# Patient Record
Sex: Female | Born: 1950 | Race: Black or African American | Hispanic: No | Smoking: Former smoker
Health system: Southern US, Community
[De-identification: ages and names within clinical notes are randomized; demographics above are authoritative.]

## PROBLEM LIST (undated history)

## (undated) DIAGNOSIS — K219 Gastro-esophageal reflux disease without esophagitis: Secondary | ICD-10-CM

## (undated) DIAGNOSIS — G4730 Sleep apnea, unspecified: Secondary | ICD-10-CM

## (undated) DIAGNOSIS — R0683 Snoring: Secondary | ICD-10-CM

## (undated) DIAGNOSIS — F419 Anxiety disorder, unspecified: Secondary | ICD-10-CM

## (undated) DIAGNOSIS — I10 Essential (primary) hypertension: Secondary | ICD-10-CM

## (undated) DIAGNOSIS — K469 Unspecified abdominal hernia without obstruction or gangrene: Secondary | ICD-10-CM

## (undated) DIAGNOSIS — F329 Major depressive disorder, single episode, unspecified: Secondary | ICD-10-CM

## (undated) DIAGNOSIS — Z923 Personal history of irradiation: Secondary | ICD-10-CM

## (undated) DIAGNOSIS — F32A Depression, unspecified: Secondary | ICD-10-CM

## (undated) DIAGNOSIS — M7022 Olecranon bursitis, left elbow: Secondary | ICD-10-CM

## (undated) DIAGNOSIS — M1990 Unspecified osteoarthritis, unspecified site: Secondary | ICD-10-CM

## (undated) DIAGNOSIS — E785 Hyperlipidemia, unspecified: Secondary | ICD-10-CM

## (undated) DIAGNOSIS — K760 Fatty (change of) liver, not elsewhere classified: Secondary | ICD-10-CM

## (undated) HISTORY — PX: HEEL SPUR EXCISION: SHX1733

## (undated) HISTORY — PX: SPINAL CORD STIMULATOR REMOVAL: SHX2423

## (undated) HISTORY — PX: OTHER SURGICAL HISTORY: SHX169

## (undated) HISTORY — DX: Anxiety disorder, unspecified: F41.9

## (undated) HISTORY — PX: BACK SURGERY: SHX140

## (undated) HISTORY — PX: ABDOMINAL HYSTERECTOMY: SHX81

## (undated) HISTORY — PX: CHOLECYSTECTOMY: SHX55

## (undated) HISTORY — PX: TONSILLECTOMY: SUR1361

## (undated) HISTORY — PX: EXCISION / CURETTAGE BONE CYST PHALANGES OF FOOT: SUR479

## (undated) HISTORY — DX: Gastro-esophageal reflux disease without esophagitis: K21.9

## (undated) HISTORY — DX: Hyperlipidemia, unspecified: E78.5

## (undated) NOTE — *Deleted (*Deleted)
Patient Care Team: Marva Panda, NP as PCP - General Pershing Proud, RN as Oncology Nurse Navigator Donnelly Angelica, RN as Oncology Nurse Navigator  DIAGNOSIS: No diagnosis found.  SUMMARY OF ONCOLOGIC HISTORY: Oncology History  Malignant neoplasm of upper-outer quadrant of right breast in female, estrogen receptor positive (HCC)  09/17/2019 Initial Diagnosis   Screening mammogram detected a right breast mass, not palpable on exam. Diagnostic mammogram showed 1.0cm mass at the 12:30 position in the right breast, with a mildly abnormal right axillary lymph node, 4.41mm. Biopsy showed IDC in the breast, grade 3, HER-2 negative (1+), ER+ 70%, PR- 0%, Ki67 85%, and the lymph node negative for carcinoma.   09/24/2019 Cancer Staging   Staging form: Breast, AJCC 8th Edition - Clinical stage from 09/24/2019: Stage IB (cT1b, cN0(f), cM0, G3, ER+, PR-, HER2-) - Signed by Serena Croissant, MD on 10/08/2019   10/01/2019 Surgery   Right lumpectomy ([facility]): IDC, grade 3, 2.8cm, clear margins, 7 right axillary lymph nodes negative for carcinoma.   10/23/2019 Oncotype testing   Oncotype DX recurrence score 68: Greater than 39% risk of distant recurrence at 9 years   11/13/2019 -  Chemotherapy   The patient had palonosetron (ALOXI) injection 0.25 mg, 0.25 mg, Intravenous,  Once, 1 of 6 cycles Administration: 0.25 mg (11/13/2019) methotrexate (PF) chemo injection 84 mg, 40 mg/m2 = 84 mg, Intravenous,  Once, 1 of 6 cycles Administration: 84 mg (11/13/2019) cyclophosphamide (CYTOXAN) 1,260 mg in sodium chloride 0.9 % 250 mL chemo infusion, 600 mg/m2 = 1,260 mg, Intravenous,  Once, 1 of 6 cycles Administration: 1,260 mg (11/13/2019) fluorouracil (ADRUCIL) chemo injection 1,250 mg, 600 mg/m2 = 1,250 mg, Intravenous,  Once, 1 of 6 cycles Administration: 1,250 mg (11/13/2019)  for chemotherapy treatment.      CHIEF COMPLIANT: Cycle 1 Day 8 CMF  INTERVAL HISTORY: [name] is a 60 y.o. with  above-mentioned history of right breast cancer who underwent a right lumpectomy and is currently on adjuvant chemotherapy with CMF. She presents to the clinic today for a toxicity check following cycle 1.  ALLERGIES:  is allergic to ceftin [cefuroxime] and zofran [ondansetron].  MEDICATIONS:  Current Outpatient Medications  Medication Sig Dispense Refill  . ALPRAZolam (XANAX) 0.5 MG tablet Take 0.25-0.5 mg by mouth 2 (two) times daily as needed. For anxiety    . amLODipine-benazepril (LOTREL) 10-20 MG per capsule Take 1 capsule by mouth daily.    . cholecalciferol (VITAMIN D) 400 UNITS TABS Take 400 Units by mouth daily.    . eszopiclone (LUNESTA) 2 MG TABS Take 3 mg by mouth at bedtime as needed for sleep. Take immediately before bedtime    . hydrochlorothiazide (HYDRODIURIL) 25 MG tablet Take 25 mg by mouth daily.    Marland Kitchen ibuprofen (ADVIL) 800 MG tablet Take 1 tablet (800 mg total) by mouth every 8 (eight) hours as needed. 30 tablet 0  . ibuprofen (ADVIL) 800 MG tablet Take 1 tablet (800 mg total) by mouth every 8 (eight) hours as needed. 30 tablet 0  . lidocaine-prilocaine (EMLA) cream Apply to affected area once 30 g 3  . omeprazole (PRILOSEC) 20 MG capsule Take 1 capsule (20 mg total) by mouth daily.    Marland Kitchen oxyCODONE (OXY IR/ROXICODONE) 5 MG immediate release tablet Take 1 tablet (5 mg total) by mouth every 6 (six) hours as needed for severe pain. 15 tablet 0  . prochlorperazine (COMPAZINE) 10 MG tablet Take 1 tablet (10 mg total) by mouth every 6 (  six) hours as needed (Nausea or vomiting). 30 tablet 1  . rosuvastatin (CRESTOR) 10 MG tablet     . sertraline (ZOLOFT) 100 MG tablet Take 200 mg by mouth daily.     No current facility-administered medications for this visit.    PHYSICAL EXAMINATION: ECOG PERFORMANCE STATUS: {CHL ONC ECOG [phone]}  There were no vitals filed for this visit. There were no vitals filed for this visit.  LABORATORY DATA:  I have reviewed the data as  listed CMP Latest Ref Rng & Units 11/13/2019 11/08/2019 09/27/2019  Glucose 70 - 99 mg/dL 161(W) 960(A) 540(J)  BUN 8 - 23 mg/dL 13 14 18   Creatinine 0.44 - 1.00 mg/dL 8.11 9.14 7.82  Sodium 135 - 145 mmol/L 137 140 138  Potassium 3.5 - 5.1 mmol/L 3.5 4.0 3.9  Chloride 98 - 111 mmol/L 101 102 106  CO2 22 - 32 mmol/L 27 29 23   Calcium 8.9 - 10.3 mg/dL 9.3 9.5 9.1  Total Protein 6.5 - 8.1 g/dL 7.7 7.9 7.6  Total Bilirubin 0.3 - 1.2 mg/dL 0.4 0.8 9.5(A)  Alkaline Phos 38 - 126 U/L 102 101 96  AST 15 - 41 U/L 27 35 43(H)  ALT 0 - 44 U/L 20 22 32    Lab Results  Component Value Date   WBC 8.3 11/13/2019   HGB 10.6 (L) 11/13/2019   HCT 33.6 (L) 11/13/2019   MCV 82.0 11/13/2019   PLT 260 11/13/2019   NEUTROABS 6.1 11/13/2019    ASSESSMENT & PLAN:  No problem-specific Assessment & Plan notes found for this encounter.    No orders of the defined types were placed in this encounter.  The patient has a good understanding of the overall plan. she agrees with it. she will call with any problems that may develop before the next visit here.  Total time spent: *** mins including face to face time and time spent for planning, charting and coordination of care  Serena Croissant, MD 11/19/2019  I, Kirt Boys Dorshimer, am acting as scribe for Dr. Serena Croissant.  {insert scribe attestation}

---

## 2000-05-18 ENCOUNTER — Encounter: Payer: Self-pay | Admitting: Gastroenterology

## 2000-05-18 ENCOUNTER — Ambulatory Visit (HOSPITAL_COMMUNITY): Admission: RE | Admit: 2000-05-18 | Discharge: 2000-05-18 | Payer: Self-pay | Admitting: Gastroenterology

## 2000-06-08 ENCOUNTER — Ambulatory Visit (HOSPITAL_COMMUNITY): Admission: RE | Admit: 2000-06-08 | Discharge: 2000-06-08 | Payer: Self-pay | Admitting: Gastroenterology

## 2000-06-08 ENCOUNTER — Encounter (INDEPENDENT_AMBULATORY_CARE_PROVIDER_SITE_OTHER): Payer: Self-pay | Admitting: Specialist

## 2000-06-19 ENCOUNTER — Encounter: Payer: Self-pay | Admitting: General Surgery

## 2000-06-20 ENCOUNTER — Encounter (INDEPENDENT_AMBULATORY_CARE_PROVIDER_SITE_OTHER): Payer: Self-pay

## 2000-06-20 ENCOUNTER — Observation Stay (HOSPITAL_COMMUNITY): Admission: RE | Admit: 2000-06-20 | Discharge: 2000-06-21 | Payer: Self-pay | Admitting: General Surgery

## 2002-07-04 ENCOUNTER — Other Ambulatory Visit (HOSPITAL_COMMUNITY): Admission: RE | Admit: 2002-07-04 | Discharge: 2002-07-16 | Payer: Self-pay | Admitting: Psychiatry

## 2003-10-22 ENCOUNTER — Other Ambulatory Visit (HOSPITAL_COMMUNITY): Admission: RE | Admit: 2003-10-22 | Discharge: 2004-01-20 | Payer: Self-pay | Admitting: Psychiatry

## 2003-10-22 ENCOUNTER — Ambulatory Visit: Payer: Self-pay | Admitting: Psychiatry

## 2004-07-05 ENCOUNTER — Emergency Department (HOSPITAL_COMMUNITY): Admission: EM | Admit: 2004-07-05 | Discharge: 2004-07-05 | Payer: Self-pay | Admitting: Emergency Medicine

## 2004-10-11 ENCOUNTER — Encounter: Admission: RE | Admit: 2004-10-11 | Discharge: 2004-10-11 | Payer: Self-pay | Admitting: Internal Medicine

## 2006-01-13 ENCOUNTER — Encounter: Admission: RE | Admit: 2006-01-13 | Discharge: 2006-01-13 | Payer: Self-pay | Admitting: Specialist

## 2006-03-27 ENCOUNTER — Encounter: Admission: RE | Admit: 2006-03-27 | Discharge: 2006-03-27 | Payer: Self-pay | Admitting: Internal Medicine

## 2006-04-26 ENCOUNTER — Encounter (INDEPENDENT_AMBULATORY_CARE_PROVIDER_SITE_OTHER): Payer: Self-pay | Admitting: *Deleted

## 2006-04-26 ENCOUNTER — Ambulatory Visit (HOSPITAL_COMMUNITY): Admission: RE | Admit: 2006-04-26 | Discharge: 2006-04-26 | Payer: Self-pay | Admitting: Gastroenterology

## 2006-06-23 ENCOUNTER — Ambulatory Visit (HOSPITAL_COMMUNITY): Admission: RE | Admit: 2006-06-23 | Discharge: 2006-06-23 | Payer: Self-pay | Admitting: Neurological Surgery

## 2006-07-13 ENCOUNTER — Inpatient Hospital Stay (HOSPITAL_COMMUNITY): Admission: RE | Admit: 2006-07-13 | Discharge: 2006-07-16 | Payer: Self-pay | Admitting: Neurological Surgery

## 2006-08-21 ENCOUNTER — Encounter: Admission: RE | Admit: 2006-08-21 | Discharge: 2006-08-21 | Payer: Self-pay | Admitting: Neurological Surgery

## 2006-08-29 ENCOUNTER — Encounter: Admission: RE | Admit: 2006-08-29 | Discharge: 2006-08-29 | Payer: Self-pay | Admitting: Neurological Surgery

## 2006-10-02 ENCOUNTER — Encounter: Admission: RE | Admit: 2006-10-02 | Discharge: 2006-10-02 | Payer: Self-pay | Admitting: Neurological Surgery

## 2006-10-18 ENCOUNTER — Observation Stay (HOSPITAL_COMMUNITY): Admission: RE | Admit: 2006-10-18 | Discharge: 2006-10-19 | Payer: Self-pay | Admitting: Neurological Surgery

## 2006-11-13 ENCOUNTER — Encounter: Admission: RE | Admit: 2006-11-13 | Discharge: 2006-11-13 | Payer: Self-pay | Admitting: Neurological Surgery

## 2006-11-27 ENCOUNTER — Encounter: Admission: RE | Admit: 2006-11-27 | Discharge: 2006-11-27 | Payer: Self-pay | Admitting: Anesthesiology

## 2007-01-23 ENCOUNTER — Encounter: Admission: RE | Admit: 2007-01-23 | Discharge: 2007-01-23 | Payer: Self-pay | Admitting: Neurological Surgery

## 2007-05-14 ENCOUNTER — Encounter: Admission: RE | Admit: 2007-05-14 | Discharge: 2007-05-14 | Payer: Self-pay | Admitting: Neurological Surgery

## 2007-05-28 ENCOUNTER — Encounter: Admission: RE | Admit: 2007-05-28 | Discharge: 2007-05-28 | Payer: Self-pay | Admitting: Neurological Surgery

## 2007-09-05 ENCOUNTER — Encounter: Admission: RE | Admit: 2007-09-05 | Discharge: 2007-09-05 | Payer: Self-pay | Admitting: Orthopedic Surgery

## 2008-01-26 ENCOUNTER — Emergency Department (HOSPITAL_COMMUNITY): Admission: EM | Admit: 2008-01-26 | Discharge: 2008-01-26 | Payer: Self-pay | Admitting: Emergency Medicine

## 2009-04-30 ENCOUNTER — Encounter: Admission: RE | Admit: 2009-04-30 | Discharge: 2009-04-30 | Payer: Self-pay | Admitting: Orthopedic Surgery

## 2010-03-07 ENCOUNTER — Encounter: Payer: Self-pay | Admitting: Orthopedic Surgery

## 2010-04-29 ENCOUNTER — Other Ambulatory Visit: Payer: Self-pay | Admitting: Gastroenterology

## 2010-04-29 DIAGNOSIS — R7989 Other specified abnormal findings of blood chemistry: Secondary | ICD-10-CM

## 2010-05-04 ENCOUNTER — Ambulatory Visit
Admission: RE | Admit: 2010-05-04 | Discharge: 2010-05-04 | Disposition: A | Discharge status: Home / Self Care | Payer: Medicare Other | Source: Ambulatory Visit | Attending: Gastroenterology | Admitting: Gastroenterology

## 2010-05-04 DIAGNOSIS — R7989 Other specified abnormal findings of blood chemistry: Secondary | ICD-10-CM

## 2010-06-29 NOTE — Op Note (Signed)
NAMEFRANNY, [name] NO.:  [id number]   MEDICAL RECORD NO.:  [id number]          PATIENT TYPE:  INP   LOCATION:  3012                         FACILITY:  [facility]   PHYSICIAN:  Tia Alert, MD     DATE OF BIRTH:  May 23, 1950   DATE OF PROCEDURE:  07/13/2006  DATE OF DISCHARGE:                               OPERATIVE REPORT   PREOPERATIVE DIAGNOSIS:  Grade 1 spondylolisthesis at L5-S1 with  foraminal stenosis, back and leg pain.   POSTOPERATIVE DIAGNOSIS:  Grade 1 spondylolisthesis at L5-S1 with  foraminal stenosis, back and leg pain.   PROCEDURE:  1. Decompressive laminectomy, facetectomy and foraminotomies L5-S1 for      decompression of the L5-S1 nerve roots requiring more work than is      usually required for a simple PLIF procedure, this is a Gill type      decompression.  2. Posterior lumbar interbody fusion L5-S1 utilizing 10 x 22 mm PEEK      interbody cage packed with local autograft and Actifuse and a 10 x      22 mm Tangent interbody bone wedge.  3. Intertransverse arthrodesis L5-S1 utilizing local autograft and      Actifuse.  4. Nonsegmental fixation L5-S1 utilizing the Stryker radius pedicle      screw system.   SURGEON:  Tia Alert, M.D.   ASSISTANT:  Reinaldo Meeker, M.D.   ANESTHESIA:  General endotracheal anesthesia.   COMPLICATIONS:  None apparent.   INDICATIONS FOR PROCEDURE:  Ms. Lutze is a 60 year old female who is  referred with back and right leg pain.  She had a CT myelogram which  showed significant foraminal stenosis L5-S1 with a grade 1  spondylolisthesis with segmental instability with movement on flexion  and extension films. She had tried medical management for quite some  time without significant relief.  I recommended a posterior  lumbar  interbody fusion of L5-S1.  She understood the risks, benefits, and  expected outcome and wished to proceed.   DESCRIPTION OF PROCEDURE:  The patient was taken to the operating  room  and after induction of adequate generalized endotracheal anesthesia, she  was rolled into the prone position on chest rolls and all pressure  points were padded.  Her lumbar region was prepped with DuraPrep and  draped in the usual sterile fashion.  10 mL of local anesthesia was  injected and a dorsal midline incision was made and carried down to the  lumbosacral fascia.  The fascia was opened. The paraspinous musculature  was taken down in subperiosteal fashion to expose L5-S1.  Intraoperative  x-ray confirmed my level.  I then carried the dissection out over the  facets to expose the transverse processes.  I then used the Kerrison  punches and Leksell rongeur to perform complete laminectomies,  hemifacetectomies, and foraminotomies at L5-S1. The underlying yellow  ligament was removed.  There was quite a bit of stenosis over the L5  nerve roots.  This was removed with Kerrison punches.  A wide  foraminotomy was performed. Both L5 and S1 nerve roots  were freed and  decompressed distally into their respective foramina.   Once my decompression was complete, I turned my attention to the  posterior lumbar interbody fusion.  The disc space was incised  bilaterally with a 15 blade scalpel and then the disc space was  distracted utilizing sequential distraction up to a height of 10 mm.  We  then used the rotating cutter and Epstein curets to prepare the  endplates followed by a 10 mm cutting chisel on the patient's right  side.  I then used a 10 mm PEEK interbody cage packed with local  autograft and Actifuse and tapped this into position at L5-S1 on the  right side.  We then prepared the endplates in the midline and on the  left in the same way utilizing rotating cutter, Epstein curets,  pituitary rongeurs, and a 10 mm cutting chisel.  We then packed the  midline with autograft and Actifuse and then placed a 10 x 22 mm Tangent  bone wedge into the interspace at L5-S1 on the left.   We  then localized the pedicle screw entry zones utilizing surface  landmarks and lateral fluoroscopy.  We probed each pedicle with a  pedicle probe, tapped each pedicle with a 525 tap, and then placed 6.75  x 40 mm pedicle screws into the L5 pedicles bilaterally and 6.75 x 30 mm  pedicle screws into the sacrum bilaterally.  We then decorticated the  transverse processes and placed a mixture of autograft and Actifuse out  over these to perform intertransverse arthrodesis.  We then placed  lordotic rods into the multiaxial screw heads of the pedicle screws and  locked these into position with the locking caps and the antitorque  device after achieving compression on our grafts.  We then irrigated  with saline solution containing bacitracin, dried all bleeding points  with bipolar cautery, inspected our nerve roots once again for adequate  decompression, lined the dura with Gelfoam, placed a medium Hemovac  drain through a separate stab incision, then closed the muscle and  fascia with 0 Vicryl, closing the subcutaneous tissue with 2-0 Vicryl,  and the subcuticular tissue with 3-0 Vicryl.  The skin was closed with  Benzoin and Steri-Strips.  The drapes were removed and a sterile  dressing was applied.  The patient was awakened from general anesthesia  and transported to the recovery room in stable condition.  At the end of  the procedure, all sponge, needle and instrument counts were correct.      Tia Alert, MD  Electronically Signed     DSJ/MEDQ  D:  07/13/2006  T:  07/13/2006  Job:  [id number]

## 2010-06-29 NOTE — Op Note (Signed)
[name], [name]                ACCOUNT NO.:  [id number]   MEDICAL RECORD NO.:  [id number]          PATIENT TYPE:  INP   LOCATION:  3172                         FACILITY:  [facility]   PHYSICIAN:  Martha Alert, MD     DATE OF BIRTH:  December 11, 1950   DATE OF PROCEDURE:  10/18/2006  DATE OF DISCHARGE:                               OPERATIVE REPORT   PREOPERATIVE DIAGNOSIS:  Failed lumbar fusion, L5/S1, with loosening of  hardware and extrusion of tangent interbody bone wedge with back and  bilateral leg pain.   POSTOPERATIVE DIAGNOSIS:  Failed lumbar fusion, L5/S1, with loosening of  hardware and extrusion of tangent interbody bone wedge with back and  bilateral leg pain.   PROCEDURE:  1. Re-exploration of lumbar fusion, L5/S1, with redo decompressive      hemifacetectomy, L5/S1, on the left with decompression of the left      L5 and S1 nerve roots.  2. Revision of posterior lumbar antibody fusion, L5/S1 on the left,      with replacement of left L5/S1 tangent interbody bone wedge.  3. Removal of hardware, L5/S1, followed by replacement of hardware,      L5/S1, utilizing the Stryker radius pedicle screw system.  4. Intertransverse arthrodesis, L5/S1 on the left, utilizing      combination of locally-harvested morselized autologous bone graft,      active fuse putty, and BMP-soaked sponges.   SURGEON:  Dr. Marikay Alar.   ASSISTANT:  Dr. Altamease Oiler.   ANESTHESIA:  General endotracheal.   COMPLICATIONS:  None apparent.   INDICATIONS FOR PROCEDURE:  [name] is a 60 year old female who  underwent a posterior lumbar interbody fusion, L5/S1, three months ago.  She was noted on postoperative x-rays to have a mild extrusion of her  tangent interbody bone wedge.  It looks like it had kicked back into the  canal about 3 mm, suggesting a pending pseudoarthrosis and loosening of  the hardware.  We got a CT scan which confirmed this after she presented  with worsening back pain about 2-1/2  months out from her surgery.  I  recommended lumbar re-exploration with exploration of the hardware and  exploration of the tangent interbody bone wedge with hopeful replacement  of that graft.  She understood the risks, benefits, expected outcome and  wished to proceed.   DESCRIPTION OF PROCEDURE:  The patient was taken to operating room.  After induction of adequate general endotracheal anesthesia, she was  rolled to the prone position on the Wilson frame.  All pressure points  were padded.  Her lumbar region was prepped with DuraPrep and then  draped in the usual sterile fashion.  Ten cubic centimeters of local  anesthesia was injected and then her old incision was ellipsed out.  The  incision was carried down to the fascia which was opened.  I identified  the spinous process of L4 and the sacrum and took down the musculature  in a subperiosteal fashion to expose the lamina of L4, the sacrum, and  then took the dissection out over the pedicle screws on  the left side.  I was able to identify the lateral part of the sacrum and the transverse  process of L5.  I then removed the locking caps of the pedicle screws  bilaterally and took out the lordotic rods.  We found both S1 pedicle  screws to be loose.  These were removed.  The L5 pedicle screws were  solid.  I then spent considerable time de-tethering the scar tissue from  the lateral edges.  I started at the inferior part of the decompression  at the sacrum.  I was able to de-tether the dura from the edge of the  sacrum and the S1 pedicle and complete the hemifacetectomy on the left  side with the Kerrison punch.  I was then able to identify the pedicle  of L5 above, identify the under underlying nerve root, and then again  widen the hemifacetectomy there until the L5 nerve roots were all  decompressed.  I was the then able to get into the disk space.  The bone  graft was carried back somewhat posteriorly and medially.  We were able   to use impactors and curettes to push the interbody graft down into the  disk space, where it was felt to be much more solid.  I had tried to  remove the tangent interbody bone wedge but it was quite solid and I was  unable to remove it with a Kocher.  I then re-tapped the S1 pedicles to  make sure that I was bicortical.  We then placed a 7.75 x 35 mm pedicle  screw into the S1 pedicle on the right and a 7.75 x 40 mm pedicle screw  into the S1 pedicle on the left to achieve bicortical purchase.  These  screws felt solid.  We then decorticated the transverse processes on the  left side and placed a mixture of BMP, local autograft, and active fuse  out over these to perform intertransverse arthrodesis.  I then placed  two lordotic rods into the multiaxial screw heads of pedicle screws and  locked these into position with a locking cap and anti torque device  after achieving compression on our grafts.  We then irrigated with  saline solution containing bacitracin.  I lined the exposed dura with  Duragen to help prevent epidural scarring.  I then closed the muscle and  the fascia with 0-Vicryl, closed the subcutaneous and subcuticular  tissue with 2-0 and 3-0 Vicryl, and closed the skin with Benzoin and  Steri-Strips.  The drapes were removed.  A sterile dressing was applied.  The patient was awakened from general anesthesia and transferred to the  recovery room in stable condition.  At the end of procedure, all sponge,  needle, and instrument counts were correct.      Martha Alert, MD  Electronically Signed     DSJ/MEDQ  D:  10/18/2006  T:  10/18/2006  Job:  [id number]

## 2010-06-29 NOTE — Discharge Summary (Signed)
[name], [name]                ACCOUNT NO.:  [id number]   MEDICAL RECORD NO.:  [id number]          PATIENT TYPE:  INP   LOCATION:  3012                         FACILITY:  [facility]   PHYSICIAN:  Tia Alert, MD     DATE OF BIRTH:  [birth year]   DATE OF ADMISSION:  07/13/2006  DATE OF DISCHARGE:  07/16/2006                               DISCHARGE SUMMARY   ADMISSION DIAGNOSIS:  Grade 1 spondylolisthesis at L5-S1 with foraminal  stenosis, back and leg pain.   PROCEDURE:  Posterior lumbar interbody fusion L5-S1.   BRIEF HISTORY OF PRESENT ILLNESS:  [name] is a 60 year old female was  referred with back and right leg pain.  She had a CT myelogram which  showed significant foraminal stenosis at L5-S1 with grade 1  spondylolisthesis with segmental instability.  She had dynamic  flexion/extension films which showed instability.  She tried medical  management for quite some time without significant relief.  I  recommended a decompression and instrumented fusion at L5-S1.  She  understood the risks, benefits, and expected outcome and wished to  proceed.   HOSPITAL COURSE:  The patient was admitted on Jul 13, 2006, and taken to  the operating room where she underwent a posterior lumbar interbody  fusion at L5-S1.  The patient tolerated the procedure well, was taken to  the recovery room and then to the floor in stable condition.  For  details of the operative procedure, please see the dictated operative  note.  The patient's hospital course was routine.  There were no  complications.  She spent the first night at bedrest with a Dilaudid PCA  protocol.  The next morning her Foley catheter was discontinued.  She  was allowed out of bed.  She ambulated without difficulty.  She had no  leg pain at this time.  She had appropriate back soreness.  Her Foley  catheter and PCA, again, were discontinued on postoperative day #1.  She  continued to improve and continued to ambulate in the hall.   She  remained afebrile with stable vital signs.  Her incision remained clean,  dry, intact.  She was discharged home in stable condition on July 16, 2006, with plans to follow up with Dr. Yetta Barre in 2 weeks.  She had all of  her discharge questions answered to her satisfaction and demonstrated  her understanding of all discharge instructions.   FINAL DIAGNOSIS:  Posterior lumbar interbody fusion at L5-S1.      Tia Alert, MD  Electronically Signed     DSJ/MEDQ  D:  09/22/2006  T:  09/22/2006  Job:  [id number]

## 2010-07-02 NOTE — Procedures (Signed)
[facility]  Patient:    [name], [name]                       MRN: [id number] Proc. Date: 06/08/00 Adm. Date:  [id number] Attending:  Charna Elizabeth CC:         Kern Reap, M.D.   Procedure Report  DATE OF BIRTH:  10-05-1950  REFERRING PHYSICIAN:  Kern Reap, M.D.  PROCEDURE PERFORMED:  Esophagogastroduodenoscopy.  ENDOSCOPIST:  Anselmo Rod, M.D.  INSTRUMENT USED:  Olympus video panendoscope.  INDICATIONS FOR PROCEDURE:  The patient is a 60 year old African-American female with a history of guaiac positive stools.  The patient claimed she has noticed black stools in the past, rule out peptic ulcer disease, esophagitis, gastritis, etc.  PREPROCEDURE PREPARATION:  Informed consent was procured from the patient. The patient was fasted for eight hours prior to the procedure.  PREPROCEDURE PHYSICAL:  The patient had stable vital signs.  Neck supple. Chest clear to auscultation.  S1, S2 regular.  Abdomen soft with normal abdominal bowel sounds.  DESCRIPTION OF PROCEDURE:  The patient was placed in left lateral decubitus position and sedated with 50 mg of Demerol and 5 mg of Versed intravenously. Once the patient was adequately sedated and maintained on low-flow oxygen and continuous cardiac monitoring, the Olympus video panendoscope was advanced through the mouthpiece, over the tongue, into the esophagus under direct vision.  The entire esophagus appeared normal without evidence of ring, stricture, masses, lesions, esophagitis or Barretts mucosa.  The scope was then advanced to the stomach.  There was antral gastritis noted, no frank ulcers, erosions, masses or polyps were seen.  There was no evidence of a hiatal hernia.  The proximal small bowel including the duodenal bulb appeared normal.  IMPRESSION:  Mild antral gastritis, otherwise normal esophagogastroduodenoscopy.  No source of bleeding identified.  RECOMMENDATION: 1.  Proceed with colonoscopy to rule out right-sided colonic lesions. 2. Avoid all nonsteroidals for now. 3. Further recommendation made after the colonoscopy. DD:  06/08/00 TD:  06/08/00 Job: [id number]

## 2010-07-02 NOTE — Op Note (Signed)
[place]  Patient:    [name], [name]                       MRN: [id number] Proc. Date: 06/20/00 Adm. Date:  [id number] Attending:  Arlis Porta CC:         Anselmo Rod, M.D.  Barbette Hair. Vaughan Basta., M.D.  Kern Reap, M.D.   Operative Report  PREOPERATIVE DIAGNOSIS:  Chronic calculus cholecystitis.  POSTOPERATIVE DIAGNOSIS:  Subacute calculus cholecystitis.  OPERATION:  Laparoscopic cholecystectomy.  SURGEON:  Adolph Pollack, M.D.  ASSISTANT:  Zigmund Daniel, M.D.  ANESTHESIA:  General  INDICATION FOR PROCEDURE:  Mrs. Chesney is a 60 year old female who had been having fairly classic biliary colic. She had an ultrasound which demonstrated multiple gallstones, 1 to 1.5 cm in size with a normal common bile duct.  The liver function tests are within normal limits.  White blood cell count was normal.  She now presents for elective cholecystectomy.  TECHNIQUE:  She was placed supine on the operating table and general anesthetic was administered.  The abdomen was sterilely prepped and draped.  A previous small subumbilical incision was reincised after infiltration of 0.5% plain Marcaine local anesthesia.  The fascia was identified and incision made in the fascia and the peritoneal cavity was entered sharply and under direct vision.  A pursestring suture of 0 Vicryl was placed around the fascial edges. A Hasson trocar was introduced into the peritoneal cavity.  Pneumoperitoneum created by insufflation of CO2 gas.  Next, the laparoscope was introduced and liver surface appeared normal.  The patient was then positioned in a reverse Trendelenburg and partial left lateral decubitus.  Local anesthetic was infiltrated in the epigastric region and an 11 mm trocar was placed through a similar size incision under direct vision.  Two 5 mm trocars were then placed in the right mid abdomen. The fundus of the gallbladder was  grasped and the adhesions between the omentum and gallbladder were taken down with the cautery.  The gallbladder was noted to be somewhat inflamed with some edema. The infundibulum was grasped and retracted toward the right.  Using careful blunt dissection, I was able to isolate the cystic duct. It was clipped three times proximally and once distally and then divided.  The cystic artery was then clipped and divided.  The gallbladder was then dissected free from the liver bed and in the right portion of the gallbladder fossa I noticed a tubular structure going directly into the gallbladder.  I felt this potentially could be an accessory duct of Luschka and put two clips on it. Once the gallbladder was removed from the liver bed, there was a small leak from a puncture wound in the gallbladder. It was placed in the endopouch bag. The liver bed was then irrigated, inspected and bleeding points controlled with the cautery.  The endopouch bag with the gallbladder in it was then removed through the subumbilical port and fascia defect was closed by tightening up and tying down the pursestring suture under direct laparoscope vision.  The perihepatic area was then irrigated once again and no bleeding or bile leakage was noted.  The rest of the trocars were removed and pneumoperitoneum was released.  The skin incisions were closed with 4-0 Monocryl subcuticular stitches followed by Steri-Strips and sterile dressings.  She tolerated the procedure well without any apparent complications and was taken to the recovery room in satisfactory condition. DD:  06/20/00  TD:  06/20/00 Job: [id number]

## 2010-07-02 NOTE — Op Note (Signed)
[name], [name]                ACCOUNT NO.:  [id number]   MEDICAL RECORD NO.:  [id number]          PATIENT TYPE:  AMB   LOCATION:  ENDO                         FACILITY:  [facility]   PHYSICIAN:  Anselmo Rod, M.D.  DATE OF BIRTH:  1950/06/14   DATE OF PROCEDURE:  04/26/2006  DATE OF DISCHARGE:  04/26/2006                               OPERATIVE REPORT   PROCEDURE PERFORMED:  Esophagogastroduodenoscopy.   ENDOSCOPIST:  Anselmo Rod, M.D.   INSTRUMENT USED:  Olympus Pentax video panendoscope.   INDICATION FOR PROCEDURE:  A 60 year old African-American female with a  history of blood in stools and change in bowel habits undergoing an EGD  to rule out peptic ulcer disease, esophagitis, gastritis, etc.   PREPROCEDURE PREPARATION:  Informed consent was procured from the  patient.  The patient fasted for 4 hours prior to the procedure.  The  risks and benefits of the procedure were discussed with the patient in  great detail.   PREPROCEDURE PHYSICAL:  The patient had stable vital signs.  NECK:  Supple.  CHEST:  Clear to auscultation.  S1, S2 regular.  ABDOMEN:  Soft with normal bowel sounds.   DESCRIPTION OF THE PROCEDURE:  The patient was placed in the left  lateral decubitus position, sedated with 75 mcg of Fentanyl and 7.5 mg  of Versed given intravenously in slow incremental doses.  Once the  patient was adequately sedated and maintained on low flow oxygen and  continuous cardiac monitoring, the Pentax video panendoscope was  advanced through the mouthpiece, over the tongue, into the esophagus  under direct vision.  The entire esophagus was widely patent with no  evidence of ring, stricture, mass, esophagitis or Barrett's mucosa.  The  scope was then advanced into the stomach.  The entire gastric mucosa in  the proximal small bowel appeared normal.  There was no acute  obstruction.  No ulcers, erosions, masses or polyps were seen.  No blood  was noted in the upper GI  tract.  The patient tolerated the procedure  well without complications.   IMPRESSION:  Normal esophagogastroduodenoscopy.   RECOMMENDATIONS:  Proceed with a colonoscopy at this time.  Further  recommendations will be made thereafter.     Anselmo Rod, M.D.  Electronically Signed    JNM/MEDQ  D:  04/27/2006  T:  04/29/2006  Job:  [id number]   cc:   Olene Craven, M.D.

## 2010-07-02 NOTE — Procedures (Signed)
[place]  Patient:    Martha, Castro                       MRN: [id number] Proc. Date: 06/08/00 Adm. Date:  [id number] Attending:  Charna Elizabeth CC:         Kern Reap, M.D.   Procedure Report  DATE OF BIRTH:  [birth year]  REFERRING PHYSICIAN:  Kern Reap, M.D.  PROCEDURE PERFORMED:  Colonoscopy.  ENDOSCOPIST:  Anselmo Rod, M.D.  INSTRUMENT USED:  Olympus video colonoscope.  INDICATIONS FOR PROCEDURE:  The patient is a 60 year old African-American female with a history of black stool and essentially unrevealing EGD, rule out colonic polyps, masses, hemorrhoids, etc.  Patient has guaiac positive stool on physical exam in the recent past.  PREPROCEDURE PREPARATION:  Informed consent was procured from the patient. The patient was fasted for eight hours prior to the procedure and prepped with a bottle of magnesium citrate and a gallon of NuLytely the night prior to the procedure.  PREPROCEDURE PHYSICAL:  The patient had stable vital signs.  Neck supple. Chest clear to auscultation.  S1, S2 regular.  Abdomen soft with normal abdominal bowel sounds.  DESCRIPTION OF PROCEDURE:  The patient was placed in the left lateral decubitus position and sedated with an additional 2 mg of Versed intravenously.  Once the patient was adequately sedated and maintained on low-flow oxygen and continuous cardiac monitoring, the Olympus video colonoscope was advanced from the rectum to the cecum with slight difficulty secondary to a large amount of residual stool in the colon, multiple small sessile polyps were noted from 10 to 20 cm.  These were biopsied with hot biopsy forceps.  A few left-sided diverticula were seen.  The rest of the colon up to the cecum appeared healthy.  Very small lesions may have been missed secondary to an inadequate prep.  IMPRESSION: 1. Left-sided diverticulosis. 2. Multiple small sessile polyps in the rectosigmoid  area, biopsied with    a hot biopsy forceps.  RECOMMENDATIONS: 1. Await pathology results. 2. Increase fluid and fiber in the diet. 3. Avoid all nonsteroidals for now. 4. Repeat guaiac testing on an outpatient basis for further recommendations.DD: 06/08/00 TD:  06/08/00 Job: [id number]

## 2010-07-02 NOTE — Discharge Summary (Signed)
[name], [name]                ACCOUNT NO.:  [id number]   MEDICAL RECORD NO.:  [id number]          PATIENT TYPE:  INP   LOCATION:  3012                         FACILITY:  [facility]   PHYSICIAN:  Tia Alert, MD     DATE OF BIRTH:  [birth year]   DATE OF ADMISSION:  07/13/2006  DATE OF DISCHARGE:  07/16/2006                               DISCHARGE SUMMARY   ADMITTING DIAGNOSIS:  Spondylolisthesis, L5-S1.   PROCEDURE:  Posterior lumbar interbody fusion, L5-S1.   BRIEF HISTORY OF PRESENT ILLNESS:  [name] is a 60 year old female who  was referred with severe back and leg pain.  She had an MRI which showed  a spondylolisthesis at L5-S1.  She had tried medical management for  quite some time without significant relief.  I recommended a posterior  lumbar interbody fusion at L5-S1 to address both her stenosis and her  segmental instability.  She understood the risks, benefits, expected  outcome and wished to proceed.   HOSPITAL COURSE:  The patient was admitted on Jul 13, 2006 and taken to  the operating room where she underwent a posterior lumbar interbody  fusion at L5-S1.  The patient tolerated the procedure well and was taken  to the recovery room and then to the floor in stable condition.  For  details of the operative procedure, please see dictated operative note.  The patient's hospital course was routine; there were no complications.  She spent the first night at bed rest with a Dilaudid low-dose PCA for  pain control.  The next morning after her Foley catheter was  discontinued she was allowed out of bed where she worked with Physical  and Occupational  Therapy to ambulate in the hall.  She was able to  ambulate without difficulty.  Her incision remained clean, dry, and  intact.  She remained afebrile with stable vital signs.  She was  discharged home in stable condition on July 16, 2006.  She was asked to  call for any unusual redness, tenderness, swelling, or drainage from  her  wound, or a temperature above 101.5.  She had all of her discharge  questions answered to her satisfaction and demonstrated understanding of  all discharge instructions.  Followup was in 2 weeks with Dr. Yetta Barre.   FINAL DIAGNOSIS:  Posterior lumbar interbody fusion at L5-S1.      Tia Alert, MD  Electronically Signed     DSJ/MEDQ  D:  09/08/2006  T:  09/08/2006  Job:  [id number]

## 2010-07-02 NOTE — Op Note (Signed)
NAMEADIVA, BOETTNER                ACCOUNT NO.:  [id number]   MEDICAL RECORD NO.:  [id number]          PATIENT TYPE:  AMB   LOCATION:  ENDO                         FACILITY:  [facility]   PHYSICIAN:  Anselmo Rod, M.D.  DATE OF BIRTH:  12/09/1950   DATE OF PROCEDURE:  04/27/2006  DATE OF DISCHARGE:                               OPERATIVE REPORT   PROCEDURE PERFORMED:  Colonoscopy with snare polypectomy x2.   ENDOSCOPIST:  Anselmo Rod, MD   INSTRUMENT USED:  Pentax video colonoscope.   INDICATION FOR PROCEDURES:  A 60 year old African American female with  history of abnormal weight loss, worsening constipation and blood in  stool undergoing screening colonoscopy to rule out colonic polyps,  masses, etc.   PREPROCEDURE PREPARATION:  Informed consent was procured from the  patient. The patient fasted for 4 hours prior to the procedure.  Risks  and benefits of the procedure were discussed with the patient in great  detail.  She was prepped with a gallon of NuLytely the night prior to  the procedure.  A 10% miss rate of cancer and polyps were discussed with  her as well.   PREPROCEDURE PHYSICAL:  VITAL SIGNS:  The patient had stable vital  signs.  NECK:  Supple.  CHEST:  Clear to auscultation.  HEART:  S1, S2 regular.  ABDOMEN:  Soft with normal bowel sounds.   DESCRIPTION OF PROCEDURE:  The patient was placed in the left lateral  decubitus position, sedated with additional 75 mcg of Fentanyl and 7.5  mg of Versed given intravenously in slow incremental doses.  Once the  patient was adequately sedated and maintained on low-flow oxygen and  continuous cardiac monitoring, the Pentax video colonoscope was advanced  from the rectum to the cecum.  A small sessile polyp was removed by hot  snare from the proximal right colon 35 cm.  There was a large amount of  residual stool in the colon.  Multiple washes were done.  A few sigmoid  diverticula were noted.  As there was relatively  poor prep, some small  lesions could be missed.  The patient tolerated the procedure well  without immediate complications.   IMPRESSION:  1. Two polyps removed from the colon, 1 from the proximal right colon,      1 from 35 cm.  2. Large amount of residual stool in the colon.  Small lesions could      be missed.   RECOMMENDATIONS:  1. Await pathology results.  2. Avoid all nonsteroidals including aspirin for the next 4 weeks.  3. Outpatient followup in the next 4 weeks for further      recommendations.  4. A few sigmoid diverticula were also noted.      Anselmo Rod, M.D.  Electronically Signed     JNM/MEDQ  D:  04/27/2006  T:  04/29/2006  Job:  [id number]   cc:   Olene Craven, M.D.

## 2010-11-19 LAB — GLUCOSE, CAPILLARY: Glucose-Capillary: 102 mg/dL — ABNORMAL HIGH (ref 70–99)

## 2010-11-26 LAB — CBC
HCT: 36.4
Hemoglobin: 12
MCHC: 33.1
MCV: 87.3
Platelets: 308
RBC: 4.17
RDW: 14.7 — ABNORMAL HIGH
WBC: 5.2

## 2010-11-26 LAB — TYPE AND SCREEN
ABO/RH(D): O POS
Antibody Screen: NEGATIVE

## 2010-11-26 LAB — BASIC METABOLIC PANEL
BUN: 12
CO2: 31
Calcium: 9.4
Chloride: 99
Creatinine, Ser: 0.61
GFR calc Af Amer: >60
GFR calc non Af Amer: >60
Glucose, Bld: 111 — ABNORMAL HIGH
Potassium: 3.4 — ABNORMAL LOW
Sodium: 136

## 2010-11-26 LAB — DIFFERENTIAL
Basophils Absolute: 0
Basophils Relative: 1
Eosinophils Absolute: 0.1
Eosinophils Relative: 2
Lymphocytes Relative: 35
Lymphs Abs: 1.8
Monocytes Absolute: 0.3
Monocytes Relative: 6
Neutro Abs: 2.9
Neutrophils Relative %: 57

## 2010-11-26 LAB — PROTIME-INR
INR: 0.9
Prothrombin Time: 12.5

## 2010-11-26 LAB — APTT: aPTT: 32

## 2010-12-07 ENCOUNTER — Other Ambulatory Visit: Payer: Self-pay | Admitting: Orthopedic Surgery

## 2010-12-07 DIAGNOSIS — M545 Low back pain, unspecified: Secondary | ICD-10-CM

## 2010-12-10 ENCOUNTER — Ambulatory Visit
Admission: RE | Admit: 2010-12-10 | Discharge: 2010-12-10 | Disposition: A | Discharge status: Home / Self Care | Payer: Medicare Other | Source: Ambulatory Visit | Attending: Orthopedic Surgery | Admitting: Orthopedic Surgery

## 2010-12-10 DIAGNOSIS — M545 Low back pain, unspecified: Secondary | ICD-10-CM

## 2011-09-30 ENCOUNTER — Emergency Department (HOSPITAL_COMMUNITY): Payer: Medicare Other

## 2011-09-30 ENCOUNTER — Emergency Department (HOSPITAL_COMMUNITY)
Admission: EM | Admit: 2011-09-30 | Discharge: 2011-09-30 | Disposition: A | Discharge status: Home / Self Care | Payer: Medicare Other | Attending: Emergency Medicine | Admitting: Emergency Medicine

## 2011-09-30 ENCOUNTER — Encounter (HOSPITAL_COMMUNITY): Payer: Self-pay | Admitting: Emergency Medicine

## 2011-09-30 DIAGNOSIS — F329 Major depressive disorder, single episode, unspecified: Secondary | ICD-10-CM | POA: Insufficient documentation

## 2011-09-30 DIAGNOSIS — I609 Nontraumatic subarachnoid hemorrhage, unspecified: Secondary | ICD-10-CM | POA: Insufficient documentation

## 2011-09-30 DIAGNOSIS — Y9351 Activity, roller skating (inline) and skateboarding: Secondary | ICD-10-CM | POA: Insufficient documentation

## 2011-09-30 DIAGNOSIS — I10 Essential (primary) hypertension: Secondary | ICD-10-CM | POA: Insufficient documentation

## 2011-09-30 DIAGNOSIS — F17200 Nicotine dependence, unspecified, uncomplicated: Secondary | ICD-10-CM | POA: Insufficient documentation

## 2011-09-30 DIAGNOSIS — Y92838 Other recreation area as the place of occurrence of the external cause: Secondary | ICD-10-CM | POA: Insufficient documentation

## 2011-09-30 DIAGNOSIS — Y9239 Other specified sports and athletic area as the place of occurrence of the external cause: Secondary | ICD-10-CM | POA: Insufficient documentation

## 2011-09-30 DIAGNOSIS — S0990XA Unspecified injury of head, initial encounter: Secondary | ICD-10-CM

## 2011-09-30 DIAGNOSIS — F3289 Other specified depressive episodes: Secondary | ICD-10-CM | POA: Insufficient documentation

## 2011-09-30 DIAGNOSIS — Z79899 Other long term (current) drug therapy: Secondary | ICD-10-CM | POA: Insufficient documentation

## 2011-09-30 HISTORY — DX: Essential (primary) hypertension: I10

## 2011-09-30 HISTORY — DX: Depression, unspecified: F32.A

## 2011-09-30 HISTORY — DX: Major depressive disorder, single episode, unspecified: F32.9

## 2011-09-30 NOTE — ED Provider Notes (Signed)
.  Medical screening examination/treatment/procedure(s) were conducted as a shared visit with non-physician practitioner(s) and myself.  I personally evaluated the patient during the encounter  Doug Sou, MD 09/30/11 2319

## 2011-09-30 NOTE — ED Notes (Signed)
PA at bedside.

## 2011-09-30 NOTE — ED Provider Notes (Signed)
History     CSN: [id number]  Arrival date & time 09/30/11  1941   First MD Initiated Contact with Patient 09/30/11 2026      Chief Complaint  Patient presents with  . Fall  . Head Injury   HPI  History provided by the patient and family. Patient is a 61 year old female with history of hypertension and depression who presents after a head injury. She was at a party rollerskating rink and fell backwards in a skating rink landing on her bottom and hitting the back of her head. Patient had a loss of consciousness lasting seconds to 1-2 minutes per family. Patient did have confusion following this and short memory loss. Patient states the next thing she remember was being into the car with family driving to the emergency room. Since that time patient has been behaving and acting normally. Patient denies any headache or head pains. She denies any neck or back pains. She denies any pains to extremities. Patient has been ambulatory.    Past Medical History  Diagnosis Date  . Depression   . Hypertension     Past Surgical History  Procedure Date  . Back surgery     4 Bilateral Titanium Screws near L4 and L5  . Tonsillectomy   . Abdominal hysterectomy     Partial    No family history on file.  History  Substance Use Topics  . Smoking status: Current Everyday Smoker -- 0 years    Types: Cigarettes  . Smokeless tobacco: Never Used  . Alcohol Use: Yes     socially    OB History    Grav Para Term Preterm Abortions TAB SAB Ect Mult Living                  Review of Systems  HENT: Negative for neck pain.   Eyes: Negative for photophobia and visual disturbance.  Respiratory: Negative for shortness of breath.   Cardiovascular: Negative for chest pain.  Musculoskeletal: Negative for back pain.  Neurological: Positive for syncope. Negative for dizziness, facial asymmetry, speech difficulty, weakness, light-headedness, numbness and headaches.  Psychiatric/Behavioral: Negative for  confusion.    Allergies  Review of patient's allergies indicates no known allergies.  Home Medications   Current Outpatient Rx  Name Route Sig Dispense Refill  . ALPRAZOLAM 0.5 MG PO TABS Oral Take 0.25-0.5 mg by mouth 2 (two) times daily as needed. For anxiety    . AMLODIPINE BESY-BENAZEPRIL HCL 10-20 MG PO CAPS Oral Take 1 capsule by mouth daily.    . CHOLECALCIFEROL 400 UNITS PO TABS Oral Take 400 Units by mouth daily.    Marland Kitchen ESOMEPRAZOLE MAGNESIUM 20 MG PO CPDR Oral Take 20 mg by mouth 2 (two) times daily.    Marland Kitchen ESZOPICLONE 3 MG PO TABS Oral Take 3 mg by mouth at bedtime as needed. For sleep    . HYDROCHLOROTHIAZIDE 25 MG PO TABS Oral Take 25 mg by mouth daily.    . SERTRALINE HCL 100 MG PO TABS Oral Take 200 mg by mouth daily.      BP 130/80  Pulse 78  Temp 98.2 F (36.8 C) (Oral)  SpO2 99%  Physical Exam  Nursing note and vitals reviewed. Constitutional: She is oriented to person, place, and time. She appears well-developed and well-nourished. No distress.  HENT:  Head: Normocephalic and atraumatic.       No battle sign or raccoon eyes  Eyes: Conjunctivae are normal. Pupils are equal, round, and reactive to  light.  Neck: Normal range of motion. Neck supple.       No cervical midline tenderness. Nexus criteria met  Cardiovascular: Normal rate and regular rhythm.   Pulmonary/Chest: Effort normal and breath sounds normal. No respiratory distress. She has no wheezes. She has no rales.  Abdominal: Soft. There is no tenderness.  Neurological: She is alert and oriented to person, place, and time. She has normal strength. No cranial nerve deficit or sensory deficit.  Skin: Skin is warm and dry.  Psychiatric: She has a normal mood and affect. Her behavior is normal.    ED Course  Procedures   Ct Head Wo Contrast  09/30/2011  *RADIOLOGY REPORT*  Clinical Data: Fall with loss of consciousness.  CT HEAD WITHOUT CONTRAST  Technique:  Contiguous axial images were obtained from the  base of the skull through the vertex without contrast.  Comparison: None.  Findings: Mild diffuse cerebral atrophy.  Focal areas of increased density material along the left posterior convexity appears to be extra-axial in likely within subarachnoid space over the posterior sulcus. Appearance suggests a small focus of subarachnoid hemorrhage.  No definite intraparenchymal or subdural hemorrhage. No parenchymal edema or mass effect.  No midline shift.  Gray-white matter junctions are distinct.  Basal cisterns are not effaced. Ventricles are not dilated.  No depressed skull fractures. Visualized paranasal sinuses and mastoid air cells are not opacified.  IMPRESSION: Focal area of subarachnoid hemorrhage along the left posterior convexity without mass effect or midline shift.  Critical Value/emergent results were called by telephone at the time of interpretation on 09/30/2011 at 29 27 hours to Dr. Orson Slick, who verbally acknowledged these results.  Original Report Authenticated By: Marlon Pel, M.D.     1. Subarachnoid hemorrhage   2. Head injury       MDM  8:35PM patient seen and evaluated. Patient awake and alert and oriented x3. Patient without any complaints of pain. No headache no nausea no episodes of vomiting. Patient is not on any blood thinners.  NEXUS criteria met.   Patient seen and evaluated with attending physician. Will consult neurosurgery.  Spoke with Dr. Gerlene Fee on call with neurosurgery. He feels patient may return home and call his office on Monday to schedule a close followup appointment. He is on call this weekend and stated that patient may call his office and on-call service if she develops any worsening symptoms.  Treatment plan has been discussed with patient and family. Patient has been given strict return precautions and followup instructions.     Angus Seller, [place] 09/30/11 2204

## 2011-09-30 NOTE — ED Notes (Signed)
MD at bedside. 

## 2011-09-30 NOTE — ED Provider Notes (Signed)
Patient fell while rollerskating tonight striking her head and occiput. She came to get "checked out" she denies headache denies nausea. Presently asymptomatic. On exam alert Glasgow Coma Score 15 results are as well HEENT exam normocephalic atraumatic neck supple no tenderness neurologic Glasgow Coma Score 15 moves all extremity well cranial nerves II through XII grossly intact. Outpatient followup arranged for patient based on CT scan  Doug Sou, MD 09/30/11 2314

## 2011-09-30 NOTE — ED Notes (Addendum)
Pt back from CT

## 2011-09-30 NOTE — ED Notes (Signed)
Pt c/o falling 1905 backwards while being on 4-wheel skates at [place] and hit her head. Pt LOC for a minute and a half with some confusion afterwards. Pt AAOx4 in ED. Denies SOB, chest pain, no headache. Family member states pt keeps asking questions. Pt passed Cinninati Stroke Scale.

## 2011-10-03 ENCOUNTER — Other Ambulatory Visit: Payer: Self-pay | Admitting: Neurological Surgery

## 2011-10-03 DIAGNOSIS — S0990XA Unspecified injury of head, initial encounter: Secondary | ICD-10-CM

## 2011-10-05 ENCOUNTER — Ambulatory Visit
Admission: RE | Admit: 2011-10-05 | Discharge: 2011-10-05 | Disposition: A | Discharge status: Home / Self Care | Payer: Medicare Other | Source: Ambulatory Visit | Attending: Neurological Surgery | Admitting: Neurological Surgery

## 2011-10-05 DIAGNOSIS — S0990XA Unspecified injury of head, initial encounter: Secondary | ICD-10-CM

## 2012-05-09 ENCOUNTER — Ambulatory Visit (INDEPENDENT_AMBULATORY_CARE_PROVIDER_SITE_OTHER): Payer: Medicare Other | Admitting: General Surgery

## 2012-05-09 ENCOUNTER — Encounter (INDEPENDENT_AMBULATORY_CARE_PROVIDER_SITE_OTHER): Payer: Self-pay | Admitting: General Surgery

## 2012-05-09 VITALS — BP 118/66 | HR 92 | Temp 97.3°F | Resp 16 | Ht 65.0 in | Wt 223.0 lb

## 2012-05-09 DIAGNOSIS — K219 Gastro-esophageal reflux disease without esophagitis: Secondary | ICD-10-CM | POA: Insufficient documentation

## 2012-05-09 DIAGNOSIS — F4323 Adjustment disorder with mixed anxiety and depressed mood: Secondary | ICD-10-CM

## 2012-05-09 DIAGNOSIS — R109 Unspecified abdominal pain: Secondary | ICD-10-CM

## 2012-05-09 DIAGNOSIS — I10 Essential (primary) hypertension: Secondary | ICD-10-CM | POA: Insufficient documentation

## 2012-05-09 NOTE — Patient Instructions (Addendum)
Try to exercise as much as you can.  We will refer you to a Primary Care Physician.

## 2012-05-09 NOTE — Progress Notes (Signed)
Patient ID: [name], female   DOB: 01-Nov-1950, 62 y.o.   MRN: [id number]  Chief Complaint  Patient presents with  . New Evaluation    eval hernia    HPI [name] is a 62 y.o. female.   HPI  She is self-referred. She was told she might have an umbilical hernia years ago he comes to have that evaluated. She also complains of having some bilateral back and flank pain. She has increased urinary frequency. Sometimes she is incontinent of urine. She has gained a significant amount weight in the lower abdomen and she is concerned about this as well. She does not have a primary care physician.  Past Medical History  Diagnosis Date  . Depression   . Hypertension   . Anxiety   . GERD (gastroesophageal reflux disease)   . Hyperlipidemia     Past Surgical History  Procedure Laterality Date  . Back surgery      4 Bilateral Titanium Screws near L4 and L5  . Tonsillectomy    . Abdominal hysterectomy      Partial  . Cholecystectomy    . Heel spur excision    . Excision / curettage bone cyst phalanges of foot      Family History  Problem Relation Age of Onset  . Diabetes Mother   . Cancer Father     prostate  . Emphysema Maternal Grandfather     Social History History  Substance Use Topics  . Smoking status: Former Smoker -- 0 years    Types: Cigarettes  . Smokeless tobacco: Never Used     Comment: uses e-cigarette  . Alcohol Use: Yes     Comment: socially    No Known Allergies  Current Outpatient Prescriptions  Medication Sig Dispense Refill  . ALPRAZolam (XANAX) 0.5 MG tablet Take 0.25-0.5 mg by mouth 2 (two) times daily as needed. For anxiety      . amLODipine-benazepril (LOTREL) 10-20 MG per capsule Take 1 capsule by mouth daily.      . cholecalciferol (VITAMIN D) 400 UNITS TABS Take 400 Units by mouth daily.      Marland Kitchen esomeprazole (NEXIUM) 20 MG capsule Take 20 mg by mouth 2 (two) times daily.      . eszopiclone (LUNESTA) 2 MG TABS Take 2 mg by mouth at  bedtime. Take immediately before bedtime      . hydrochlorothiazide (HYDRODIURIL) 25 MG tablet Take 25 mg by mouth daily.      . sertraline (ZOLOFT) 100 MG tablet Take 200 mg by mouth daily.       No current facility-administered medications for this visit.    Review of Systems Review of Systems  Constitutional:       Weight gain.  Gastrointestinal: Positive for abdominal pain (bilateral flank areas) and abdominal distention.  Genitourinary: Positive for frequency.  Musculoskeletal: Positive for back pain.    Blood pressure 118/66, pulse 92, temperature 97.3 F (36.3 C), temperature source Temporal, resp. rate 16, height 5\' 5"  (1.651 m), weight 223 lb (101.152 kg).  Physical Exam Physical Exam  Constitutional:  Obese female in NAD.  HENT:  Head: Normocephalic and atraumatic.  Abdominal: Soft. She exhibits no mass. There is no tenderness.  Obese with large pannus.  No palpable umbilical hernia.  No obvious palpable mass.  Genitourinary:  No palpable inguinal bulges.    Data Reviewed None  Assessment    Bilateral flank pains. This may be referred pain from her back. I do  not find any evidence of a hernia at this time. She has multiple medical problems and complaints but no primary care physician.     Plan    Will refer her to her primary care physician for thorough overall evaluation. Exercise has been recommended.  Return visit prn.        Kuulei Kleier J 05/09/2012, 10:01 AM

## 2012-09-17 ENCOUNTER — Encounter (HOSPITAL_COMMUNITY): Payer: Self-pay

## 2012-09-21 NOTE — H&P (Signed)
  History of Present Illness The patient is a 62 year old female who presents today for follow up of their back. The patient is being followed for their central (and is here to discuss her MRI). They are now 17 year(s) out. Symptoms reported today include: pain, pain at night (24/7), stiffness and popping. The patient states that they are doing poorly (since ceasing the TENS unit). The following medication has been used for pain control: none. The patient reports their current pain level to be 10 / 10. The patient presents today following MRI (of her thoracic spine done on 09/06/2012).    Subjective Transcription  REASON FOR CONSULTATION:  Followup from spinal cord stimulator placement.  HISTORY OF PRESENT ILLNESS:  [name] has done exceptionally well with her trial stimulator. The patient had excellent relief with almost zero out of 10 pain. She states that her quality of life is now back to its poor state since it was removed.    Allergies No Known Drug Allergies. 12/06/2010   Social History Alcohol use. current drinker; drinks beer; only occasionally per week Children. 1 Current work status. disabled Drug/Alcohol Rehab (Currently). no Drug/Alcohol Rehab (Previously). no Exercise. Exercises daily; does other Exercises rarely; does other Illicit drug use. no Living situation. live alone Marital status. divorced Number of flights of stairs before winded. less than 1 Pain Contract. no Tobacco / smoke exposure. no Tobacco use. current some days smoker; smoke(d) less than 1/2 pack(s) per day   Medication History Nitrofurantoin Monohyd Macro (100MG  Capsule, Oral) Active. (QD) ALPRAZolam ( Oral) Specific dose unknown - Active. (QD) NexIUM ( Oral) Specific dose unknown - Active. (QD) Amlodipine Besy-Benazepril HCl ( Oral) Specific dose unknown - Active. (QD) Hydrochlorothiazide ( Oral) Specific dose unknown - Active. (QD) Sertraline HCl ( Oral)  Specific dose unknown - Active. (QD) Vitamin D (1000UNIT Capsule, Oral) Active. (QD) Medications Reconciled.   Objective Transcription  The patient is a pleasant woman who appears younger than her stated age. She is alert and oriented times three.  Respiratory, no shortness of breath or chest pain.  Abdomen, soft and nontender.  Urinary, she has bladder incontinence consistent with stress urgency which is not changed. No incontinence of bowel.  On examination, she has normal lower extremity motor and sensory examination but she has significant back, buttock and bilateral leg pain (neuropathic pain). She has well healed surgical scar from pervious lumbar operation.   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   Assessments Transcription  At this point in time, the patient has had a successful trial of spinal cord stimulator placement. We have discussed the risks of permanent implantation which include infection, bleeding, nerve damage, death, stroke paralysis, failure to heal, migration of the lead, ongoing or worse pain, need for further surgery. All of the patient's questions were addressed.    Plans Transcription  We will plan on proceeding with surgery in the very near future.

## 2012-09-24 ENCOUNTER — Encounter (HOSPITAL_COMMUNITY)
Admission: RE | Admit: 2012-09-24 | Discharge: 2012-09-24 | Disposition: A | Discharge status: Home / Self Care | Payer: Medicare Other | Source: Ambulatory Visit | Attending: Orthopedic Surgery | Admitting: Orthopedic Surgery

## 2012-09-24 ENCOUNTER — Encounter (HOSPITAL_COMMUNITY)
Admission: RE | Admit: 2012-09-24 | Discharge: 2012-09-24 | Disposition: A | Discharge status: Home / Self Care | Payer: Medicare Other | Source: Ambulatory Visit | Attending: Anesthesiology | Admitting: Anesthesiology

## 2012-09-24 ENCOUNTER — Encounter (HOSPITAL_COMMUNITY): Payer: Self-pay

## 2012-09-24 HISTORY — DX: Unspecified osteoarthritis, unspecified site: M19.90

## 2012-09-24 HISTORY — DX: Unspecified abdominal hernia without obstruction or gangrene: K46.9

## 2012-09-24 LAB — SURGICAL PCR SCREEN
MRSA, PCR: NEGATIVE
Staphylococcus aureus: NEGATIVE

## 2012-09-24 NOTE — Pre-Procedure Instructions (Addendum)
MELVIE PAGLIA  09/24/2012   Your procedure is scheduled on:  Wednesday, August 13th.  Report to [facility] at 11:00AM.  Call this number if you have problems the morning of surgery: [phone]   Remember:   Do not eat food or drink liquids after midnight.   Take these medicines the morning of surgery with A SIP OF WATER: esomeprazole (NEXIUM),sertraline (ZOLOFT).  Take if needed:  ALPRAZolam Prudy Feeler).     Do not wear jewelry, make-up or nail polish.  Do not wear lotions, powders, or perfumes. You may wear deodorant.  Do not shave 48 hours prior to surgery.   Do not bring valuables to the hospital.  [facility] is not responsible for any belongings or valuables.  Contacts, dentures or bridgework may not be worn into surgery.  Leave suitcase in the car. After surgery it may be brought to your room.  For patients admitted to the hospital, checkout time is 11:00 AM the day of  discharge.   Patients discharged the day of surgery will not be allowed to drive home.  Name and phone number of your driver: -   Special Instructions: Shower using CHG 2 nights before surgery and the night before surgery.  If you shower the day of surgery use CHG.  Use special wash - you have one bottle of CHG for all showers.  You should use approximately 1/3 of the bottle for each shower.  Bring Your Brace to the Hospital with You.    Please read over the following fact sheets that you were given: Pain Booklet, Coughing and Deep Breathing and Surgical Site Infection Prevention

## 2012-09-24 NOTE — Progress Notes (Signed)
09/24/12 1530  OBSTRUCTIVE SLEEP APNEA  Have you ever been diagnosed with sleep apnea through a sleep study? No  Do you snore loudly (loud enough to be heard through closed doors)?  1  Do you often feel tired, fatigued, or sleepy during the daytime? 0  Has anyone observed you stop breathing during your sleep? 0  Do you have, or are you being treated for high blood pressure? 1  BMI more than 35 kg/m2? 1  Age over 62 years old? 1  Neck circumference greater than 40 cm/18 inches? 0  Gender: 0  Obstructive Sleep Apnea Score 4

## 2012-09-24 NOTE — Progress Notes (Signed)
09/24/12 1530  OBSTRUCTIVE SLEEP APNEA  Score 4 or greater  Results sent to PCP

## 2012-09-25 MED ORDER — CEFAZOLIN SODIUM-DEXTROSE 2-3 GM-% IV SOLR
2.0000 g | INTRAVENOUS | Status: AC
  Administered 2012-09-26: 2 g via INTRAVENOUS
  Filled 2012-09-25: qty 50

## 2012-09-25 NOTE — Progress Notes (Signed)
Anesthesia Chart Review:  Patient is a 62 year old female scheduled for spinal cord stimulator placement on 09/26/12 by Dr. Shon Baton.  History includes obesity, former smoker, HTN, HLD, GERD, anxiety, depression, arthritis, tonsillectomy, hysterectomy, cholecystectomy, prior back surgery.  No palpable umbilical or inguinal hernia noted during a 05/09/12 GS evaluation by Dr. Abbey Chatters.  OSA screening score was 4. She was seen at [facility] by Angela Cox, NP and cleared for this procedure.  Pre-operative labs noted.  EKG on 09/16/12 (PCP) showed NSR, non-specific anterior ST/T wave abnormality.  CXR on 09/24/12 showed: Possible new left basilar pulmonary nodule. Alternately, this could be within the overlying breast or other soft tissues or reflect a rib end. Because of the uncertainty on this examination, a repeat PA view is recommended prior to any further imaging. The radiologist report states that the results will be called to the ordering physician.  I also called the report to [facility] at Dr. Shon Baton office who will have him review.  Results should not interfere with proceeding to OR, but will need follow-up in the future.  Defer timing of further evaluation to Dr. Shon Baton.  Velna Ochs [facility]/Anesthesiology Phone [phone] 09/25/2012 10:27 AM

## 2012-09-26 ENCOUNTER — Ambulatory Visit (HOSPITAL_COMMUNITY): Payer: Medicare Other

## 2012-09-26 ENCOUNTER — Ambulatory Visit (HOSPITAL_COMMUNITY): Payer: Medicare Other | Admitting: Anesthesiology

## 2012-09-26 ENCOUNTER — Encounter (HOSPITAL_COMMUNITY): Payer: Self-pay | Admitting: Anesthesiology

## 2012-09-26 ENCOUNTER — Observation Stay (HOSPITAL_COMMUNITY)
Admission: RE | Admit: 2012-09-26 | Discharge: 2012-09-27 | Disposition: A | Discharge status: Home / Self Care | Payer: Medicare Other | Source: Ambulatory Visit | Attending: Orthopedic Surgery | Admitting: Orthopedic Surgery

## 2012-09-26 ENCOUNTER — Encounter (HOSPITAL_COMMUNITY): Payer: Self-pay | Admitting: Vascular Surgery

## 2012-09-26 ENCOUNTER — Encounter (HOSPITAL_COMMUNITY)
Admission: RE | Disposition: A | Discharge status: Home / Self Care | Payer: Self-pay | Source: Ambulatory Visit | Attending: Orthopedic Surgery

## 2012-09-26 DIAGNOSIS — G5790 Unspecified mononeuropathy of unspecified lower limb: Secondary | ICD-10-CM | POA: Insufficient documentation

## 2012-09-26 DIAGNOSIS — I10 Essential (primary) hypertension: Secondary | ICD-10-CM | POA: Insufficient documentation

## 2012-09-26 DIAGNOSIS — G8929 Other chronic pain: Principal | ICD-10-CM | POA: Insufficient documentation

## 2012-09-26 DIAGNOSIS — Z01818 Encounter for other preprocedural examination: Secondary | ICD-10-CM | POA: Insufficient documentation

## 2012-09-26 DIAGNOSIS — IMO0001 Reserved for inherently not codable concepts without codable children: Secondary | ICD-10-CM | POA: Insufficient documentation

## 2012-09-26 DIAGNOSIS — M549 Dorsalgia, unspecified: Secondary | ICD-10-CM | POA: Insufficient documentation

## 2012-09-26 DIAGNOSIS — Z79899 Other long term (current) drug therapy: Secondary | ICD-10-CM | POA: Insufficient documentation

## 2012-09-26 DIAGNOSIS — M79609 Pain in unspecified limb: Secondary | ICD-10-CM | POA: Insufficient documentation

## 2012-09-26 HISTORY — PX: SPINAL CORD STIMULATOR INSERTION: SHX5378

## 2012-09-26 SURGERY — CERVICAL SPINAL CORD STIMULATOR INSERTION
Anesthesia: General | Site: Spine Thoracic | Wound class: Clean

## 2012-09-26 MED ORDER — AMLODIPINE BESY-BENAZEPRIL HCL 10-20 MG PO CAPS: 1.0000 | ORAL_CAPSULE | Freq: Every day | ORAL | Status: DC

## 2012-09-26 MED ORDER — LACTATED RINGERS IV SOLN
INTRAVENOUS | Status: DC | PRN
  Administered 2012-09-26 (×2): via INTRAVENOUS

## 2012-09-26 MED ORDER — THROMBIN 20000 UNITS EX KIT
PACK | CUTANEOUS | Status: DC | PRN
  Administered 2012-09-26: 20000 [IU] via TOPICAL

## 2012-09-26 MED ORDER — HEMOSTATIC AGENTS (NO CHARGE) OPTIME
TOPICAL | Status: DC | PRN
  Administered 2012-09-26: 1 via TOPICAL

## 2012-09-26 MED ORDER — HYDROMORPHONE HCL PF 1 MG/ML IJ SOLN
INTRAMUSCULAR | Status: DC | PRN
  Administered 2012-09-26: 1 mg via INTRAVENOUS

## 2012-09-26 MED ORDER — DEXAMETHASONE SODIUM PHOSPHATE 4 MG/ML IJ SOLN
4.0000 mg | Freq: Four times a day (QID) | INTRAMUSCULAR | Status: DC
  Filled 2012-09-26 (×7): qty 1

## 2012-09-26 MED ORDER — OXYCODONE HCL 5 MG PO TABS
10.0000 mg | ORAL_TABLET | ORAL | Status: DC | PRN
  Filled 2012-09-26: qty 2

## 2012-09-26 MED ORDER — ACETAMINOPHEN 10 MG/ML IV SOLN
INTRAVENOUS | Status: AC
  Filled 2012-09-26: qty 100

## 2012-09-26 MED ORDER — ONDANSETRON HCL 4 MG/2ML IJ SOLN
INTRAMUSCULAR | Status: DC | PRN
  Administered 2012-09-26: 4 mg via INTRAVENOUS

## 2012-09-26 MED ORDER — NEOSTIGMINE METHYLSULFATE 1 MG/ML IJ SOLN
INTRAMUSCULAR | Status: DC | PRN
  Administered 2012-09-26: 3 mg via INTRAVENOUS

## 2012-09-26 MED ORDER — ROCURONIUM BROMIDE 100 MG/10ML IV SOLN
INTRAVENOUS | Status: DC | PRN
  Administered 2012-09-26: 50 mg via INTRAVENOUS

## 2012-09-26 MED ORDER — FENTANYL CITRATE 0.05 MG/ML IJ SOLN
INTRAMUSCULAR | Status: DC | PRN
  Administered 2012-09-26: 50 ug via INTRAVENOUS
  Administered 2012-09-26 (×2): 100 ug via INTRAVENOUS

## 2012-09-26 MED ORDER — DEXAMETHASONE 4 MG PO TABS
4.0000 mg | ORAL_TABLET | Freq: Four times a day (QID) | ORAL | Status: DC
Start: 2012-09-26 — End: 2012-09-27
  Administered 2012-09-26 – 2012-09-27 (×3): 4 mg via ORAL
  Filled 2012-09-26 (×7): qty 1

## 2012-09-26 MED ORDER — DEXAMETHASONE SODIUM PHOSPHATE 4 MG/ML IJ SOLN
4.0000 mg | Freq: Once | INTRAMUSCULAR | Status: AC
  Administered 2012-09-26: 10 mg via INTRAVENOUS
  Filled 2012-09-26: qty 1

## 2012-09-26 MED ORDER — PHENOL 1.4 % MT LIQD: 1.0000 | OROMUCOSAL | Status: DC | PRN

## 2012-09-26 MED ORDER — MIDAZOLAM HCL 5 MG/5ML IJ SOLN
INTRAMUSCULAR | Status: DC | PRN
  Administered 2012-09-26: 2 mg via INTRAVENOUS

## 2012-09-26 MED ORDER — ONDANSETRON HCL 4 MG/2ML IJ SOLN: 4.0000 mg | INTRAMUSCULAR | Status: DC | PRN

## 2012-09-26 MED ORDER — SODIUM CHLORIDE 0.9 % IJ SOLN: 3.0000 mL | Freq: Two times a day (BID) | INTRAMUSCULAR | Status: DC

## 2012-09-26 MED ORDER — CEFAZOLIN SODIUM 1-5 GM-% IV SOLN
1.0000 g | Freq: Three times a day (TID) | INTRAVENOUS | Status: AC
  Administered 2012-09-26 – 2012-09-27 (×2): 1 g via INTRAVENOUS
  Filled 2012-09-26 (×2): qty 50

## 2012-09-26 MED ORDER — MENTHOL 3 MG MT LOZG: 1.0000 | LOZENGE | OROMUCOSAL | Status: DC | PRN

## 2012-09-26 MED ORDER — LACTATED RINGERS IV SOLN
INTRAVENOUS | Status: DC
  Administered 2012-09-26: 17:00:00 via INTRAVENOUS

## 2012-09-26 MED ORDER — MORPHINE SULFATE 2 MG/ML IJ SOLN
1.0000 mg | INTRAMUSCULAR | Status: DC | PRN
  Administered 2012-09-26 – 2012-09-27 (×4): 2 mg via INTRAVENOUS
  Filled 2012-09-26 (×3): qty 1

## 2012-09-26 MED ORDER — HYDROMORPHONE HCL PF 1 MG/ML IJ SOLN: 0.2500 mg | INTRAMUSCULAR | Status: DC | PRN

## 2012-09-26 MED ORDER — SODIUM CHLORIDE 0.9 % IV SOLN: 250.0000 mL | INTRAVENOUS | Status: DC

## 2012-09-26 MED ORDER — PHENYLEPHRINE HCL 10 MG/ML IJ SOLN
INTRAMUSCULAR | Status: DC | PRN
  Administered 2012-09-26: 80 ug via INTRAVENOUS

## 2012-09-26 MED ORDER — METHOCARBAMOL 100 MG/ML IJ SOLN: 500.0000 mg | Freq: Four times a day (QID) | INTRAVENOUS | Status: DC | PRN

## 2012-09-26 MED ORDER — BUPIVACAINE-EPINEPHRINE 0.25% -1:200000 IJ SOLN
INTRAMUSCULAR | Status: DC | PRN
  Administered 2012-09-26: 10 mL

## 2012-09-26 MED ORDER — ACETAMINOPHEN 10 MG/ML IV SOLN
1000.0000 mg | Freq: Four times a day (QID) | INTRAVENOUS | Status: DC
  Administered 2012-09-26 – 2012-09-27 (×3): 1000 mg via INTRAVENOUS
  Filled 2012-09-26 (×4): qty 100

## 2012-09-26 MED ORDER — SERTRALINE HCL 100 MG PO TABS
200.0000 mg | ORAL_TABLET | Freq: Every day | ORAL | Status: DC
  Administered 2012-09-27: 200 mg via ORAL
  Filled 2012-09-26: qty 2

## 2012-09-26 MED ORDER — MORPHINE SULFATE 2 MG/ML IJ SOLN
INTRAMUSCULAR | Status: AC
  Filled 2012-09-26: qty 1

## 2012-09-26 MED ORDER — BUPIVACAINE-EPINEPHRINE PF 0.25-1:200000 % IJ SOLN
INTRAMUSCULAR | Status: AC
  Filled 2012-09-26: qty 30

## 2012-09-26 MED ORDER — BENAZEPRIL HCL 20 MG PO TABS
20.0000 mg | ORAL_TABLET | Freq: Every day | ORAL | Status: DC
  Administered 2012-09-27: 20 mg via ORAL
  Filled 2012-09-26 (×2): qty 1

## 2012-09-26 MED ORDER — HYDROMORPHONE HCL PF 1 MG/ML IJ SOLN
INTRAMUSCULAR | Status: AC
  Administered 2012-09-26: 0.5 mg via INTRAVENOUS
  Filled 2012-09-26: qty 1

## 2012-09-26 MED ORDER — PROPOFOL 10 MG/ML IV BOLUS
INTRAVENOUS | Status: DC | PRN
  Administered 2012-09-26: 150 mg via INTRAVENOUS

## 2012-09-26 MED ORDER — GLYCOPYRROLATE 0.2 MG/ML IJ SOLN
INTRAMUSCULAR | Status: DC | PRN
  Administered 2012-09-26: 0.4 mg via INTRAVENOUS

## 2012-09-26 MED ORDER — METHOCARBAMOL 500 MG PO TABS
500.0000 mg | ORAL_TABLET | Freq: Four times a day (QID) | ORAL | Status: DC | PRN
  Administered 2012-09-26: 500 mg via ORAL
  Filled 2012-09-26: qty 1

## 2012-09-26 MED ORDER — SODIUM CHLORIDE 0.9 % IJ SOLN: 3.0000 mL | INTRAMUSCULAR | Status: DC | PRN

## 2012-09-26 MED ORDER — 0.9 % SODIUM CHLORIDE (POUR BTL) OPTIME
TOPICAL | Status: DC | PRN
  Administered 2012-09-26: 1000 mL

## 2012-09-26 MED ORDER — LIDOCAINE HCL (CARDIAC) 20 MG/ML IV SOLN
INTRAVENOUS | Status: DC | PRN
  Administered 2012-09-26: 50 mg via INTRAVENOUS

## 2012-09-26 MED ORDER — ZOLPIDEM TARTRATE 5 MG PO TABS: 5.0000 mg | ORAL_TABLET | Freq: Every evening | ORAL | Status: DC | PRN

## 2012-09-26 MED ORDER — ALPRAZOLAM 0.25 MG PO TABS: 0.2500 mg | ORAL_TABLET | Freq: Two times a day (BID) | ORAL | Status: DC | PRN

## 2012-09-26 MED ORDER — OXYCODONE HCL 5 MG/5ML PO SOLN: 5.0000 mg | Freq: Once | ORAL | Status: DC | PRN

## 2012-09-26 MED ORDER — OXYCODONE HCL 5 MG PO TABS: 5.0000 mg | ORAL_TABLET | Freq: Once | ORAL | Status: DC | PRN

## 2012-09-26 MED ORDER — ACETAMINOPHEN 10 MG/ML IV SOLN
1000.0000 mg | Freq: Four times a day (QID) | INTRAVENOUS | Status: DC
  Administered 2012-09-26: 1000 mg via INTRAVENOUS

## 2012-09-26 MED ORDER — AMLODIPINE BESYLATE 10 MG PO TABS
10.0000 mg | ORAL_TABLET | Freq: Every day | ORAL | Status: DC
  Administered 2012-09-26 – 2012-09-27 (×2): 10 mg via ORAL
  Filled 2012-09-26 (×2): qty 1

## 2012-09-26 MED ORDER — LACTATED RINGERS IV SOLN
INTRAVENOUS | Status: DC
  Administered 2012-09-26: 11:00:00 via INTRAVENOUS

## 2012-09-26 MED ORDER — HYDROCHLOROTHIAZIDE 25 MG PO TABS
25.0000 mg | ORAL_TABLET | Freq: Every day | ORAL | Status: DC
  Administered 2012-09-26 – 2012-09-27 (×2): 25 mg via ORAL
  Filled 2012-09-26 (×2): qty 1

## 2012-09-26 MED ORDER — THROMBIN 20000 UNITS EX SOLR
CUTANEOUS | Status: AC
  Filled 2012-09-26: qty 20000

## 2012-09-26 SURGICAL SUPPLY — 71 items
BAG ISL DRAPE 18X18 STRL (DRAPES) ×1
BAG ISOLATION DRAPE 18X18 (DRAPES) ×1 IMPLANT
BRAIN SPATULA ×1 IMPLANT
CANISTER SUCTION 2500CC (MISCELLANEOUS) ×2 IMPLANT
CHARGING SYSTEM ×1 IMPLANT
CLOTH BEACON ORANGE TIMEOUT ST (SAFETY) ×2 IMPLANT
CLSR STERI-STRIP ANTIMIC 1/2X4 (GAUZE/BANDAGES/DRESSINGS) ×2 IMPLANT
CORDS BIPOLAR (ELECTRODE) ×2 IMPLANT
DRAPE C-ARM 42X72 X-RAY (DRAPES) ×2 IMPLANT
DRAPE INCISE IOBAN 85X60 (DRAPES) ×2 IMPLANT
DRAPE ISOLATION BAG 18X18 (DRAPES) ×1
DRAPE ORTHO SPLIT 77X108 STRL (DRAPES) ×2
DRAPE POUCH INSTRU U-SHP 10X18 (DRAPES) ×2 IMPLANT
DRAPE SURG 17X23 STRL (DRAPES) ×8 IMPLANT
DRAPE SURG ORHT 6 SPLT 77X108 (DRAPES) ×2 IMPLANT
DRAPE U-SHAPE 47X51 STRL (DRAPES) ×2 IMPLANT
DRSG MEPILEX BORDER 4X4 (GAUZE/BANDAGES/DRESSINGS) ×2 IMPLANT
DRSG MEPILEX BORDER 4X8 (GAUZE/BANDAGES/DRESSINGS) ×1 IMPLANT
DURAPREP 26ML APPLICATOR (WOUND CARE) ×2 IMPLANT
ELECT BLADE 4.0 EZ CLEAN MEGAD (MISCELLANEOUS) ×2
ELECT CAUTERY BLADE 6.4 (BLADE) ×2 IMPLANT
ELECT REM PT RETURN 9FT ADLT (ELECTROSURGICAL) ×2
ELECTRODE BLDE 4.0 EZ CLN MEGD (MISCELLANEOUS) IMPLANT
ELECTRODE REM PT RTRN 9FT ADLT (ELECTROSURGICAL) ×1 IMPLANT
GLOVE BIOGEL PI IND STRL 6.5 (GLOVE) ×1 IMPLANT
GLOVE BIOGEL PI IND STRL 8.5 (GLOVE) ×1 IMPLANT
GLOVE BIOGEL PI INDICATOR 6.5 (GLOVE)
GLOVE BIOGEL PI INDICATOR 8.5 (GLOVE) ×1
GLOVE ECLIPSE 6.0 STRL STRAW (GLOVE) ×1 IMPLANT
GLOVE ECLIPSE 8.5 STRL (GLOVE) ×4 IMPLANT
GOWN PREVENTION PLUS XXLARGE (GOWN DISPOSABLE) ×2 IMPLANT
GOWN STRL NON-REIN LRG LVL3 (GOWN DISPOSABLE) ×4 IMPLANT
KIT BASIN OR (CUSTOM PROCEDURE TRAY) ×2 IMPLANT
KIT POSITION SURG JACKSON T1 (MISCELLANEOUS) ×1 IMPLANT
KIT ROOM TURNOVER OR (KITS) ×2 IMPLANT
LAMI NARROW PRIPOLE 16CH (Orthopedic Implant) ×1 IMPLANT
MARKER SKIN DUAL TIP RULER LAB (MISCELLANEOUS) ×1 IMPLANT
NDL MAYO TROCAR (NEEDLE) IMPLANT
NDL SPNL 18GX3.5 QUINCKE PK (NEEDLE) IMPLANT
NEEDLE 22X1 1/2 (OR ONLY) (NEEDLE) ×2 IMPLANT
NEEDLE MAYO TROCAR (NEEDLE) ×2 IMPLANT
NEEDLE SPNL 18GX3.5 QUINCKE PK (NEEDLE) ×4 IMPLANT
NS IRRIG 1000ML POUR BTL (IV SOLUTION) ×2 IMPLANT
PACK LAMINECTOMY ORTHO (CUSTOM PROCEDURE TRAY) ×2 IMPLANT
PACK UNIVERSAL I (CUSTOM PROCEDURE TRAY) ×2 IMPLANT
PAD ARMBOARD 7.5X6 YLW CONV (MISCELLANEOUS) ×4 IMPLANT
PATIENT PROGRAMMER ×1 IMPLANT
PIN MAYFIELD SKULL DISP (PIN) ×1 IMPLANT
PROGRAMMER PATIENT (MISCELLANEOUS) ×1 IMPLANT
SPONGE LAP 4X18 X RAY DECT (DISPOSABLE) ×2 IMPLANT
SPONGE SURGIFOAM ABS GEL SZ50 (HEMOSTASIS) ×1 IMPLANT
STAPLER VISISTAT 35W (STAPLE) ×3 IMPLANT
STIMULATOR IPG PROTEGE SPINAL (Stimulator) ×1 IMPLANT
STRIP CLOSURE SKIN 1/2X4 (GAUZE/BANDAGES/DRESSINGS) ×2 IMPLANT
SURGIFLO TRUKIT (HEMOSTASIS) ×1 IMPLANT
SUT FIBERWIRE #2 38 REV NDL BL (SUTURE)
SUT FIBERWIRE #2 38 T-5 BLUE (SUTURE) ×2
SUT MNCRL AB 3-0 PS2 18 (SUTURE) ×4 IMPLANT
SUT PDS AB 2-0 CT1 27 (SUTURE) ×6 IMPLANT
SUT VIC AB 1 CT1 27 (SUTURE) ×6
SUT VIC AB 1 CT1 27XBRD ANBCTR (SUTURE) ×3 IMPLANT
SUT VIC AB 2-0 CT1 18 (SUTURE) ×4 IMPLANT
SUTURE FIBERWR #2 38 T-5 BLUE (SUTURE) IMPLANT
SUTURE FIBERWR#2 38 REV NDL BL (SUTURE) ×1 IMPLANT
SYR BULB IRRIGATION 50ML (SYRINGE) ×2 IMPLANT
SYR CONTROL 10ML LL (SYRINGE) ×4 IMPLANT
SYSTEM CHARGING PRODIGY (MISCELLANEOUS) ×1 IMPLANT
TOWEL OR 17X24 6PK STRL BLUE (TOWEL DISPOSABLE) ×3 IMPLANT
TOWEL OR 17X26 10 PK STRL BLUE (TOWEL DISPOSABLE) ×2 IMPLANT
TRAY FOLEY CATH 16FRSI W/METER (SET/KITS/TRAYS/PACK) IMPLANT
WATER STERILE IRR 1000ML POUR (IV SOLUTION) ×1 IMPLANT

## 2012-09-26 NOTE — Transfer of Care (Signed)
Immediate Anesthesia Transfer of Care Note  Patient: [name]  Procedure(s) Performed: Procedure(s): SPINAL CORD STIMULATOR PLACEMENT (N/A)  Patient Location: PACU  Anesthesia Type:General  Level of Consciousness: awake, alert , oriented and patient cooperative  Airway & Oxygen Therapy: Patient Spontanous Breathing and Patient connected to nasal cannula oxygen  Post-op Assessment: Report given to PACU RN, Post -op Vital signs reviewed and stable and Patient moving all extremities X 4  Post vital signs: Reviewed and stable  Complications: No apparent anesthesia complications

## 2012-09-26 NOTE — H&P (Signed)
No change in clinical exam H+P reviewed  

## 2012-09-26 NOTE — Anesthesia Preprocedure Evaluation (Addendum)
Anesthesia Evaluation  Patient identified by MRN, date of birth, ID band Patient awake    Reviewed: Allergy & Precautions, H&P , NPO status , Patient's Chart, lab work & pertinent test results  Airway Mallampati: II TM Distance: >3 FB Neck ROM: Full    Dental no notable dental hx. (+) Partial Lower, Partial Upper and Dental Advisory Given   Pulmonary neg pulmonary ROS,  breath sounds clear to auscultation  Pulmonary exam normal       Cardiovascular hypertension, On Medications Rhythm:Regular Rate:Normal     Neuro/Psych PSYCHIATRIC DISORDERS negative neurological ROS     GI/Hepatic Neg liver ROS, GERD-  Medicated and Controlled,  Endo/Other  negative endocrine ROS  Renal/GU negative Renal ROS  negative genitourinary   Musculoskeletal   Abdominal   Peds  Hematology negative hematology ROS (+)   Anesthesia Other Findings   Reproductive/Obstetrics negative OB ROS                          Anesthesia Physical Anesthesia Plan  ASA: II  Anesthesia Plan: General   Post-op Pain Management:    Induction: Intravenous  Airway Management Planned: Oral ETT  Additional Equipment:   Intra-op Plan:   Post-operative Plan: Extubation in OR  Informed Consent: I have reviewed the patients History and Physical, chart, labs and discussed the procedure including the risks, benefits and alternatives for the proposed anesthesia with the patient or authorized representative who has indicated his/her understanding and acceptance.   Dental advisory given  Plan Discussed with: CRNA and Surgeon  Anesthesia Plan Comments:        Anesthesia Quick Evaluation

## 2012-09-26 NOTE — Brief Op Note (Signed)
09/26/2012  1:48 PM  PATIENT:  [name]  62 y.o. female  PRE-OPERATIVE DIAGNOSIS:  CHRONIC PAIN  POST-OPERATIVE DIAGNOSIS:  CHRONIC PAIN  PROCEDURE:  Procedure(s): SPINAL CORD STIMULATOR PLACEMENT (N/A)  SURGEON:  Surgeon(s) and Role:    * Venita Lick, MD - Primary  PHYSICIAN ASSISTANT:   ASSISTANTS: none   ANESTHESIA:   general  EBL:  Total I/O In: 1000 [I.V.:1000] Out: -   BLOOD ADMINISTERED:none  DRAINS: none   LOCAL MEDICATIONS USED:  MARCAINE     SPECIMEN:  No Specimen  DISPOSITION OF SPECIMEN:  N/A  COUNTS:  YES  TOURNIQUET:  * No tourniquets in log *  DICTATION: .Other Dictation: Dictation Number [id number]  PLAN OF CARE: Admit for overnight observation  PATIENT DISPOSITION:  PACU - hemodynamically stable.

## 2012-09-26 NOTE — Anesthesia Postprocedure Evaluation (Signed)
  Anesthesia Post-op Note  Patient: [name]  Procedure(s) Performed: Procedure(s): SPINAL CORD STIMULATOR PLACEMENT (N/A)  Patient Location: PACU  Anesthesia Type:General  Level of Consciousness: awake and alert   Airway and Oxygen Therapy: Patient Spontanous Breathing and Patient connected to nasal cannula oxygen  Post-op Pain: mild  Post-op Assessment: Post-op Vital signs reviewed, Patient's Cardiovascular Status Stable, Respiratory Function Stable, Patent Airway and No signs of Nausea or vomiting  Post-op Vital Signs: Reviewed and stable  Complications: No apparent anesthesia complications

## 2012-09-27 MED ORDER — DOCUSATE SODIUM 100 MG PO CAPS: 100.0000 mg | ORAL_CAPSULE | Freq: Three times a day (TID) | ORAL | Status: DC | PRN

## 2012-09-27 MED ORDER — ONDANSETRON HCL 4 MG PO TABS: 4.0000 mg | ORAL_TABLET | Freq: Three times a day (TID) | ORAL | Status: DC | PRN

## 2012-09-27 MED ORDER — ACETAMINOPHEN 500 MG PO TABS: 1000.0000 mg | ORAL_TABLET | ORAL | Status: DC

## 2012-09-27 MED ORDER — POLYETHYLENE GLYCOL 3350 17 GM/SCOOP PO POWD: 17.0000 g | Freq: Every day | ORAL | Status: DC

## 2012-09-27 MED ORDER — HYDROCODONE-ACETAMINOPHEN 5-325 MG PO TABS: 1.0000 | ORAL_TABLET | ORAL | Status: DC | PRN

## 2012-09-27 NOTE — Progress Notes (Signed)
    Subjective: Procedure(s) (LRB): SPINAL CORD STIMULATOR PLACEMENT (N/A) 1 Day Post-Op  Patient reports pain as 1 on 0-10 scale.  Reports decreased leg pain reports incisional back pain   Positive void Negative bowel movement Positive flatus Negative chest pain or shortness of breath  Objective: Vital signs in last 24 hours: Temp:  [97.5 F (36.4 C)-98.5 F (36.9 C)] 98.5 F (36.9 C) (08/14 0523) Pulse Rate:  [68-98] 74 (08/14 0523) Resp:  [9-24] 16 (08/14 0523) BP: (90-147)/(53-98) 126/77 mmHg (08/14 0523) SpO2:  [94 %-100 %] 96 % (08/14 0523)  Intake/Output from previous day: 08/13 0701 - 08/14 0700 In: 2700 [P.O.:960; I.V.:1740] Out: 75 [Blood:75]  Labs: No results found for this basename: WBC, RBC, HCT, PLT,  in the last 72 hours No results found for this basename: NA, K, CL, CO2, BUN, CREATININE, GLUCOSE, CALCIUM,  in the last 72 hours No results found for this basename: LABPT, INR,  in the last 72 hours  Physical Exam: Neurologically intact ABD soft Neurovascular intact Incision: dressing C/D/I and no drainage Compartment soft  Assessment/Plan: Patient stable  xrays n/a Continue mobilization with physical therapy Continue care  Advance diet D/C IV fluids Ok for D/C to home  Venita Lick, MD [facility] [phone]

## 2012-09-27 NOTE — Discharge Summary (Signed)
Patient ID: [name] MRN: [id number] DOB/AGE: [birth year] 62 y.o.  Admit date: 09/26/2012 Discharge date: 09/27/2012  Admission Diagnoses:  Active Problems:   * No active hospital problems. *   Discharge Diagnoses:  Active Problems:   * No active hospital problems. *  status post Procedure(s): SPINAL CORD STIMULATOR PLACEMENT  Past Medical History  Diagnosis Date  . Depression   . Hypertension   . Anxiety   . GERD (gastroesophageal reflux disease)   . Hyperlipidemia   . Hernia     umbilical  . Arthritis     Surgeries: Procedure(s): SPINAL CORD STIMULATOR PLACEMENT on 09/26/2012   Consultants:  none  Discharged Condition: Improved  Hospital Course: [name] is an 62 y.o. female who was admitted 09/26/2012 for operative treatment of <principal problem not specified>. Patient failed conservative treatments (please see the history and physical for the specifics) and had severe unremitting pain that affects sleep, daily activities and work/hobbies. After pre-op clearance, the patient was taken to the operating room on 09/26/2012 and underwent  Procedure(s): SPINAL CORD STIMULATOR PLACEMENT.    Patient was given perioperative antibiotics: Anti-infectives   Start     Dose/Rate Route Frequency Ordered Stop   09/26/12 2000  ceFAZolin (ANCEF) IVPB 1 g/50 mL premix     1 g 100 mL/hr over 30 Minutes Intravenous Every 8 hours 09/26/12 1548 09/27/12 0415   09/25/12 1436  ceFAZolin (ANCEF) IVPB 2 g/50 mL premix     2 g 100 mL/hr over 30 Minutes Intravenous 30 min pre-op 09/25/12 1436 09/26/12 1155       Patient was given sequential compression devices and early ambulation to prevent DVT.   Patient benefited maximally from hospital stay and there were no complications. At the time of discharge, the patient was urinating/moving their bowels without difficulty, tolerating a regular diet, pain is controlled with oral pain medications and they have been cleared by PT/OT.    Recent vital signs: Patient Vitals for the past 24 hrs:  BP Temp Temp src Pulse Resp SpO2  09/27/12 0523 126/77 mmHg 98.5 F (36.9 C) Oral 74 16 96 %  09/27/12 0155 120/77 mmHg 98.5 F (36.9 C) Oral 68 16 95 %  09/26/12 2257 117/78 mmHg 98 F (36.7 C) Oral 70 16 97 %  09/26/12 1856 90/55 mmHg 97.9 F (36.6 C) - 70 16 96 %  09/26/12 1551 106/68 mmHg 97.9 F (36.6 C) - 77 16 97 %  09/26/12 1515 93/58 mmHg 97.5 F (36.4 C) - 70 16 100 %  09/26/12 1500 96/55 mmHg - - 78 13 100 %  09/26/12 1445 101/53 mmHg - - 79 14 100 %  09/26/12 1430 111/61 mmHg - - 79 24 94 %  09/26/12 1415 107/70 mmHg - - 80 13 98 %  09/26/12 1400 130/76 mmHg 97.9 F (36.6 C) - 79 9 96 %  09/26/12 1034 147/98 mmHg 98.4 F (36.9 C) Oral 98 20 99 %     Recent laboratory studies: No results found for this basename: WBC, HGB, HCT, PLT, NA, K, CL, CO2, BUN, CREATININE, GLUCOSE, PT, INR, CALCIUM, 2,  in the last 72 hours   Discharge Medications:     Medication List    ASK your doctor about these medications       ALPRAZolam 0.5 MG tablet  Commonly known as:  XANAX  Take 0.25-0.5 mg by mouth 2 (two) times daily as needed. For anxiety     amLODipine-benazepril 10-20  MG per capsule  Commonly known as:  LOTREL  Take 1 capsule by mouth daily.     cholecalciferol 400 UNITS Tabs tablet  Commonly known as:  VITAMIN D  Take 400 Units by mouth daily.     esomeprazole 20 MG capsule  Commonly known as:  NEXIUM  Take 20 mg by mouth 2 (two) times daily.     eszopiclone 2 MG Tabs tablet  Commonly known as:  LUNESTA  Take 3 mg by mouth at bedtime. Take immediately before bedtime     hydrochlorothiazide 25 MG tablet  Commonly known as:  HYDRODIURIL  Take 25 mg by mouth daily.     sertraline 100 MG tablet  Commonly known as:  ZOLOFT  Take 200 mg by mouth daily.        Diagnostic Studies: Dg Chest 2 View  09/24/2012   *RADIOLOGY REPORT*  Clinical Data: Preadmission respiratory examination.  History of  hypertension.  CHEST - 2 VIEW  Comparison: 06/23/2006.  Findings: There is a stable mild scoliosis with mild volume loss at the left lung base.  There is a questionable new nodular density at the left lung base adjacent the heart border on the frontal examination.  There is no corresponding abnormality on the lateral view.  Based on location, this would not appear to be related to the left nipple shadow. It could be due to a rib end.  The right lung is clear.  Heart size and mediastinal contours are stable. Cholecystectomy clips are noted.  IMPRESSION: Possible new left basilar pulmonary nodule.  Alternately, this could be within the overlying breast or other soft tissues or reflect a rib end. Because of the uncertainty on this examination, a repeat PA view is recommended prior to any further imaging.  These results will be called to the ordering clinician or representative by the Radiologist Assistant, and communication documented in the PACS Dashboard.   Original Report Authenticated By: Carey Bullocks, M.D.   Dg Thoracic Spine 4v  09/26/2012   *RADIOLOGY REPORT*  Clinical Data: Thoracic spinal stimulator placement.  THORACIC SPINE - 4+ VIEW  Comparison: Chest radiographs 11/14.  Findings: Spot fluoroscopic images centered near the thoracolumbar junction are limited by small field of view.  Accurate counting of the thoracic segments is not possible.  These views demonstrate a spinal stimulator positioned within the lower thoracic spinal canal.  IMPRESSION: Thoracic spinal stimulator placement as described.   Original Report Authenticated By: Carey Bullocks, M.D.          Follow-up Information   Follow up with Alvy Beal, MD. Schedule an appointment as soon as possible for a visit in 2 weeks.   Specialty:  Orthopedic Surgery   Contact information:   [address] [place] [address] [phone]       Discharge Plan:  discharge to home  Disposition:  stable    Signed: Venita Lick D for Dr. Venita Lick [facility] [phone] 09/27/2012, 10:09 AM

## 2012-09-27 NOTE — Evaluation (Signed)
Occupational Therapy Evaluation Patient Details Name: [name] MRN: [id number] DOB: Sep 30, 1950 Today's Date: 09/27/2012 Time: 4098-1191 OT Time Calculation (min): 15 min  OT Assessment / Plan / Recommendation History of present illness s/p insertion of spinal cord stimulator    Clinical Impression   Pt doing well overall at supervision level with ADL. No follow up needs and all education covered.    OT Assessment  Patient does not need any further OT services    Follow Up Recommendations  Supervision - Intermittent    Barriers to Discharge      Equipment Recommendations  None recommended by OT    Recommendations for Other Services    Frequency       Precautions / Restrictions Precautions Precautions: Back Precaution Booklet Issued: Yes (comment) Precaution Comments:  pt familliar with back precautions from previous surgeries. stated 2/3 on her own. Reviewed all. Restrictions Weight Bearing Restrictions: No   Pertinent Vitals/Pain No complaint of    ADL  Eating/Feeding: Simulated;Independent Where Assessed - Eating/Feeding: Chair Grooming: Simulated;Supervision/safety Where Assessed - Grooming: Unsupported standing Upper Body Bathing: Simulated;Chest;Right arm;Left arm;Abdomen;Set up Where Assessed - Upper Body Bathing: Unsupported sitting Lower Body Bathing: Simulated;Supervision/safety Where Assessed - Lower Body Bathing: Supported sit to stand Upper Body Dressing: Simulated;Supervision/safety;Set up Where Assessed - Upper Body Dressing: Unsupported sitting Lower Body Dressing: Simulated;Supervision/safety Where Assessed - Lower Body Dressing: Supported sit to stand Toilet Transfer: Dentist: Comfort height toilet;Grab bars Toileting - Architect and Hygiene: Simulated;Supervision/safety (verbal cues for back precautions) Where Assessed - Toileting Clothing Manipulation and Hygiene: Sit to stand from  3-in-1 or toilet Tub/Shower Transfer: Simulated;Supervision/safety Tub/Shower Transfer Method:  (side step over tub) ADL Comments: Reviewed back care handout. Pt needed intermittant cues to not twist especially to reach for items located behind her. She states her mother will be available to help at d/c.     OT Diagnosis:    OT Problem List:   OT Treatment Interventions:     OT Goals(Current goals can be found in the care plan section) Acute Rehab OT Goals Patient Stated Goal: to get back to exercising   Visit Information  Last OT Received On: 09/27/12 Assistance Needed: +1 History of Present Illness: s/p insertion of spinal cord stimulator        Prior Functioning     Home Living Family/patient expects to be discharged to:: Private residence Living Arrangements: Parent Available Help at Discharge: Family;Available 24 hours/day Type of Home: House Home Access: Level entry Home Layout: One level Home Equipment: Cane - single point;Walker - 2 wheels;Bedside commode;Shower seat;Hand held shower head Additional Comments: plans to D/C with mother; has tub shower with seat  Prior Function Level of Independence: Independent with assistive device(s) Comments: amb with SPC or RW; has fallen 3 times in the past 6 months  Communication Communication: No difficulties Dominant Hand: Right         Vision/Perception     Cognition  Cognition Arousal/Alertness: Awake/alert Behavior During Therapy: WFL for tasks assessed/performed Overall Cognitive Status: Within Functional Limits for tasks assessed    Extremity/Trunk Assessment Upper Extremity Assessment Upper Extremity Assessment: Overall WFL for tasks assessed Lower Extremity Assessment Lower Extremity Assessment: Overall WFL for tasks assessed Cervical / Trunk Assessment Cervical / Trunk Assessment: Normal     Mobility Bed Mobility Bed Mobility: Rolling Left;Left Sidelying to Sit Rolling Left: 5: Supervision Left  Sidelying to Sit: 5: Supervision;HOB flat Details for Bed Mobility Assistance: supervision for cues for log rolling  technique; pt was able to follow cues and perform log rolling technique without physical (A)   Transfers Transfers: Sit to Stand;Stand to Sit Sit to Stand: 6: Modified independent (Device/Increase time);With upper extremity assist;From chair/3-in-1;From toilet Stand to Sit: 6: Modified independent (Device/Increase time);To toilet;To chair/3-in-1 Details for Transfer Assistance: good technique and adherence to back precautions     Exercise General Exercises - Lower Extremity Ankle Circles/Pumps: AROM;10 reps;Both   Balance Balance Balance Assessed: Yes Static Standing Balance Static Standing - Balance Support: Right upper extremity supported;During functional activity Static Standing - Level of Assistance: 6: Modified independent (Device/Increase time) Dynamic Standing Balance Dynamic Standing - Level of Assistance: 5: Stand by assistance   End of Session OT - End of Session Activity Tolerance: Patient tolerated treatment well Patient left: in chair;with call bell/phone within reach  GO Functional Assessment Tool Used: clinical judgement Functional Limitation: Self care Self Care Current Status (W0981): 0 percent impaired, limited or restricted Self Care Goal Status (X9147): At least 1 percent but less than 20 percent impaired, limited or restricted Self Care Discharge Status [phone]): At least 1 percent but less than 20 percent impaired, limited or restricted   Lennox Laity [phone] 09/27/2012, 9:56 AM

## 2012-09-27 NOTE — Evaluation (Signed)
Physical Therapy Evaluation Patient Details Name: [name] MRN: [id number] DOB: [birth year] Today's Date: 09/27/2012 Time: 8295-6213 PT Time Calculation (min): 21 min  PT Assessment / Plan / Recommendation History of Present Illness  s/p insertion of spinal cord stimulator   Clinical Impression  Patient is s/p implantation of spinal cord stimulator surgery. Pt is supervision to mod I for all mobility. All education completed this session. Pt given back precautions handout and educated on log rolling technique. No physical (A) needed for mobility. Pt will have 24/7 (A) upon D/C by mother. No HHPT needed at this time. Will sign off on pt at this time.      PT Assessment  Patent does not need any further PT services    Follow Up Recommendations  Supervision - Intermittent;Supervision for mobility/OOB    Does the patient have the potential to tolerate intense rehabilitation      Barriers to Discharge        Equipment Recommendations  None recommended by PT    Recommendations for Other Services OT consult   Frequency      Precautions / Restrictions Precautions Precautions: Back Precaution Booklet Issued: Yes (comment) Precaution Comments: reviewed back precautions; pt familliar with back precautions from previous surgeries  Restrictions Weight Bearing Restrictions: No   Pertinent Vitals/Pain 3/10 "just in the surgery area"       Mobility  Bed Mobility Bed Mobility: Rolling Left;Left Sidelying to Sit Rolling Left: 5: Supervision Left Sidelying to Sit: 5: Supervision;HOB flat Details for Bed Mobility Assistance: supervision for cues for log rolling technique; pt was able to follow cues and perform log rolling technique without physical (A)   Transfers Transfers: Sit to Stand;Stand to Sit Sit to Stand: 6: Modified independent (Device/Increase time);From bed Stand to Sit: 6: Modified independent (Device/Increase time);To chair/3-in-1;With armrests Details for Transfer  Assistance: no physical (A) required; pt demo good technique with transfers  Ambulation/Gait Ambulation/Gait Assistance: 6: Modified independent (Device/Increase time) Ambulation Distance (Feet): 300 Feet Assistive device: Other (Comment) (walking stick ) Ambulation/Gait Assistance Details: pt amb with walking stick; no LOB noted; pt with good safety awareness  Gait Pattern: Wide base of support;Decreased hip/knee flexion - right;Decreased hip/knee flexion - left Gait velocity: decreased slightly due to pain in incision area  General Gait Details: min cues to not twist with turns  Stairs: Yes Stairs Assistance: 5: Supervision Stairs Assistance Details (indicate cue type and reason): pt demo good step to technique; supervision for safety  Stair Management Technique: Two rails;Step to pattern;Forwards Number of Stairs: 3 Wheelchair Mobility Wheelchair Mobility: No    Exercises General Exercises - Lower Extremity Ankle Circles/Pumps: AROM;10 reps;Both   PT Diagnosis:    PT Problem List:   PT Treatment Interventions:       PT Goals(Current goals can be found in the care plan section) Acute Rehab PT Goals Patient Stated Goal: to get back to exercising  PT Goal Formulation: No goals set, d/c therapy  Visit Information  Last PT Received On: 09/27/12 Assistance Needed: +1 History of Present Illness: s/p insertion of spinal cord stimulator        Prior Functioning  Home Living Family/patient expects to be discharged to:: Private residence Living Arrangements: Parent Available Help at Discharge: Family;Available 24 hours/day Type of Home: House Home Access: Level entry Home Layout: One level Home Equipment: Cane - single point;Walker - 2 wheels;Bedside commode;Shower seat;Hand held shower head Additional Comments: plans to D/C with mother; has tub shower with seat  Prior Function  Level of Independence: Independent with assistive device(s) Comments: amb with SPC or RW; has  fallen 3 times in the past 6 months  Communication Communication: No difficulties Dominant Hand: Right    Cognition  Cognition Arousal/Alertness: Awake/alert Behavior During Therapy: WFL for tasks assessed/performed Overall Cognitive Status: Within Functional Limits for tasks assessed    Extremity/Trunk Assessment Upper Extremity Assessment Upper Extremity Assessment: Defer to OT evaluation Lower Extremity Assessment Lower Extremity Assessment: Overall WFL for tasks assessed Cervical / Trunk Assessment Cervical / Trunk Assessment: Normal   Balance Balance Balance Assessed: Yes Static Standing Balance Static Standing - Balance Support: Right upper extremity supported;During functional activity Static Standing - Level of Assistance: 6: Modified independent (Device/Increase time)  End of Session PT - End of Session Activity Tolerance: Patient tolerated treatment well Patient left: in chair;with call bell/phone within reach Nurse Communication: Mobility status  GP Functional Assessment Tool Used: clinical judgement  Functional Limitation: Mobility: Walking and moving around Mobility: Walking and Moving Around Current Status (Z6109): At least 1 percent but less than 20 percent impaired, limited or restricted Mobility: Walking and Moving Around Goal Status [phone]): At least 1 percent but less than 20 percent impaired, limited or restricted Mobility: Walking and Moving Around Discharge Status [phone]): At least 1 percent but less than 20 percent impaired, limited or restricted   Donell Sievert, [place] [phone] 09/27/2012, 8:27 AM

## 2012-09-27 NOTE — Progress Notes (Signed)
09/27/12 Spoke with patient, she will be staying with family member at discharge. No follow up or equipment needs identified by therapy evaluations. Jacquelynn Cree RN, BSN, CCM

## 2012-09-27 NOTE — Op Note (Signed)
[name], [name] NO.:  [id number]  MEDICAL RECORD NO.:  [id number]  LOCATION:  5N01C                        FACILITY:  [facility]  PHYSICIAN:  Alvy Beal, MD    DATE OF BIRTH:  December 24, 1950  DATE OF PROCEDURE:  09/26/2012 DATE OF DISCHARGE:                              OPERATIVE REPORT   PREOPERATIVE DIAGNOSIS:  Chronic pain failed back syndrome.  POSTOPERATIVE DIAGNOSIS:  Chronic pain failed back syndrome.  OPERATIVE PROCEDURE:  Implantation of spinal cord stimulator.  COMPLICATIONS:  None.  CONDITION:  Stable.  INSTRUMENTATION SYSTEM USED:  St. Jude spinal cord stimulator standard __________ with a battery placed on the right side.  HISTORY:  This is a very pleasant woman who had a previous __________ instrumented fusion who had known on to have ongoing consistent pain. Eventually my partner, Dr. Ethelene Hal placed a spinal cord stimulator, and she had an excellent response.  As a result, we elected to proceed with a permanent implantation.  All risks, benefits, and alternatives were discussed with the patient and consent was obtained.  OPERATIVE NOTE:  The patient was brought to the operating room, placed supine on the operating table.  After successful induction of general anesthesia and endotracheal intubation, TEDs, SCDs were inserted.  The patient was turned prone onto the Wilson frame and all bony prominences were well padded.  The thoracic and lumbar spine were prepped and draped in a standard fashion.  Time-out was taken to confirm patient, procedure, and all other pertinent important data.  Once this was done, a fluoroscopic x-ray was brought into the wound in the lateral planes having counted up from the L5 vertebral body until I identified the T9 vertebral body and the T10 vertebral body.  The patient's programming preoperatively based was done predominantly at the T8-9 level.  As such, I elected to proceed with a T9 laminotomy and advanced the  paddle from there.  Once I identified the T9 pedicle, I then infiltrated the skin, so that I could make an incision spanning superior T9 to the inferior aspect of the midportion of T11.  Once I had this done, I made a midline incision.  Sharply dissected down to the deep fascia and incised the deep fascia and exposed the spinous process and lamina of T9 and T10 and a portion of that at T11.  I then placed self-retaining retractors into the wound and then obtained hemostasis using bipolar electrocautery.  I then took another set of x-rays encountered up again confirming my level.  I then switched to the AP, identified the T12 vertebral body based on it being the last rib containing vertebral body.  I then counted up and confirmed now on the AP plane that was at the appropriate level.  Once this was done, I used a double-action Leksell rongeur to remove the spinous process of T9 and then used a fine nerve hook to develop a plane underneath the lamina of T9.  I then used a 2-mm Kerrison rongeur to perform a generous laminotomy of T9.  I then released the ligamentum flavum and identified the underlying thecal sac. I then passed the dural spatula with great ease behind the vertebral  body of T9 and T8.  At this point, I obtained the actual implant and then advanced it in the midline to just at the T7-8 disk space.  I confirmed it was in the midline and I confirmed with the rep who was in the room that it was in the proper position and that this was where they were effectively programming in the trial.  Once this was done, I then secured the leads to the T10 spinous process using FiberWire.  I then looped it around the T10 and T11 interspace.  I then made a second incision on the right gluteal region and dissected down 2.5 cm and then created a pocket.  I then passed the wire using the submuscular Passer from the thoracic wound to the battery site.  I then advanced the leads submuscular to the  new incision site.  Once this was done, I then connected it to the battery.  Once this was done, the battery was then placed into the cavity and then we tested the battery.  All the leads were functioning without issue.  I then secured the battery and placed with #1 Vicryl sutures and I wrapped the excess lead underneath the battery.  I irrigated both the wounds copiously with normal saline, made sure I had hemostasis using bipolar electrocautery.  I closed both wounds with a #1 Vicryl for the deep layer, superficial with 2-0 Vicryl suture, and 3-0 Monocryl for the skin.  Steri-Strips and dry dressing were applied.  The patient was ultimately extubated, transferred to the PACU without incident.  At the end of the case, all needle and sponge counts were correct.  There was no adverse intraoperative events.     Alvy Beal, MD     DDB/MEDQ  D:  09/26/2012  T:  09/27/2012  Job:  [id number]

## 2012-09-27 NOTE — Progress Notes (Signed)
Utilization review completed. Kazuko Clemence, RN, BSN. 

## 2012-09-27 NOTE — Progress Notes (Signed)
SCHEDULED IV ACETAMINOPHEN:  CONVERSION TO ORAL ROUTE to complete the ordered doses.  The Pharmacy and Therapeutics Committee has restricted administration of IV acetaminophen (with a 24 hr maximum duration) to patients who meet both of the following criteria:  Unable to tolerate oral or enteral medication  Contraindication to NSAIDs  Because the patient has taken other oral medications today, IV acetaminophen has been converted to PO to complete the course of therapy originally ordered.  If PO acetaminophen should be continued beyond the original stop time, please adjust the order accordingly using the "modify" function.   If you have questions about this conversion, please contact the pharmacy department.  Brigid Re, [facility] 09/27/2012 11:28 AM

## 2012-09-28 NOTE — Progress Notes (Signed)
Clinical Social Worker will sign off for now as social work intervention is no longer needed. Please consult [place] again if new need arises.   Sabino Niemann, MSW, L [phone]

## 2012-10-01 ENCOUNTER — Encounter (HOSPITAL_COMMUNITY): Payer: Self-pay | Admitting: Orthopedic Surgery

## 2012-12-20 ENCOUNTER — Other Ambulatory Visit: Payer: Self-pay

## 2013-11-29 ENCOUNTER — Other Ambulatory Visit: Payer: Self-pay

## 2014-02-14 DIAGNOSIS — N179 Acute kidney failure, unspecified: Secondary | ICD-10-CM

## 2014-02-14 HISTORY — DX: Acute kidney failure, unspecified: N17.9

## 2014-08-03 ENCOUNTER — Encounter (HOSPITAL_BASED_OUTPATIENT_CLINIC_OR_DEPARTMENT_OTHER): Payer: Self-pay | Admitting: Emergency Medicine

## 2014-08-03 ENCOUNTER — Inpatient Hospital Stay (HOSPITAL_BASED_OUTPATIENT_CLINIC_OR_DEPARTMENT_OTHER)
Admission: EM | Admit: 2014-08-03 | Discharge: 2014-08-05 | DRG: 683 | Disposition: A | Discharge status: Home / Self Care | Payer: Medicare Other | Attending: Internal Medicine | Admitting: Internal Medicine

## 2014-08-03 DIAGNOSIS — R112 Nausea with vomiting, unspecified: Secondary | ICD-10-CM | POA: Diagnosis present

## 2014-08-03 DIAGNOSIS — E785 Hyperlipidemia, unspecified: Secondary | ICD-10-CM | POA: Diagnosis present

## 2014-08-03 DIAGNOSIS — Z79899 Other long term (current) drug therapy: Secondary | ICD-10-CM

## 2014-08-03 DIAGNOSIS — N179 Acute kidney failure, unspecified: Secondary | ICD-10-CM | POA: Diagnosis not present

## 2014-08-03 DIAGNOSIS — I10 Essential (primary) hypertension: Secondary | ICD-10-CM | POA: Diagnosis not present

## 2014-08-03 DIAGNOSIS — Z9049 Acquired absence of other specified parts of digestive tract: Secondary | ICD-10-CM | POA: Diagnosis present

## 2014-08-03 DIAGNOSIS — K219 Gastro-esophageal reflux disease without esophagitis: Secondary | ICD-10-CM | POA: Diagnosis present

## 2014-08-03 DIAGNOSIS — R197 Diarrhea, unspecified: Secondary | ICD-10-CM | POA: Diagnosis present

## 2014-08-03 DIAGNOSIS — Z833 Family history of diabetes mellitus: Secondary | ICD-10-CM

## 2014-08-03 DIAGNOSIS — E860 Dehydration: Secondary | ICD-10-CM | POA: Diagnosis not present

## 2014-08-03 DIAGNOSIS — F329 Major depressive disorder, single episode, unspecified: Secondary | ICD-10-CM | POA: Diagnosis present

## 2014-08-03 DIAGNOSIS — E861 Hypovolemia: Secondary | ICD-10-CM | POA: Diagnosis present

## 2014-08-03 DIAGNOSIS — F419 Anxiety disorder, unspecified: Secondary | ICD-10-CM | POA: Diagnosis present

## 2014-08-03 DIAGNOSIS — Z825 Family history of asthma and other chronic lower respiratory diseases: Secondary | ICD-10-CM

## 2014-08-03 DIAGNOSIS — N390 Urinary tract infection, site not specified: Secondary | ICD-10-CM | POA: Diagnosis present

## 2014-08-03 DIAGNOSIS — Z87891 Personal history of nicotine dependence: Secondary | ICD-10-CM

## 2014-08-03 LAB — URINALYSIS, ROUTINE W REFLEX MICROSCOPIC
Glucose, UA: NEGATIVE mg/dL
Ketones, ur: 15 mg/dL — AB
Nitrite: NEGATIVE
Protein, ur: 30 mg/dL — AB
Specific Gravity, Urine: 1.018 (ref 1.005–1.030)
Urobilinogen, UA: 1 mg/dL (ref 0.0–1.0)
pH: 5.5 (ref 5.0–8.0)

## 2014-08-03 LAB — CBC WITH DIFFERENTIAL/PLATELET
Band Neutrophils: 3 % (ref 0–10)
Basophils Absolute: 0 10*3/uL (ref 0.0–0.1)
Basophils Relative: 0 % (ref 0–1)
Eosinophils Absolute: 0 10*3/uL (ref 0.0–0.7)
Eosinophils Relative: 0 % (ref 0–5)
HCT: 41.7 % (ref 36.0–46.0)
Hemoglobin: 13.8 g/dL (ref 12.0–15.0)
Lymphocytes Relative: 13 % (ref 12–46)
Lymphs Abs: 1.8 10*3/uL (ref 0.7–4.0)
MCH: 29.4 pg (ref 26.0–34.0)
MCHC: 33.1 g/dL (ref 30.0–36.0)
MCV: 88.9 fL (ref 78.0–100.0)
Monocytes Absolute: 0.5 10*3/uL (ref 0.1–1.0)
Monocytes Relative: 4 % (ref 3–12)
Neutro Abs: 11.4 10*3/uL — ABNORMAL HIGH (ref 1.7–7.7)
Neutrophils Relative %: 80 % — ABNORMAL HIGH (ref 43–77)
Platelets: 385 10*3/uL (ref 150–400)
RBC: 4.69 MIL/uL (ref 3.87–5.11)
RDW: 14.3 % (ref 11.5–15.5)
WBC: 13.7 10*3/uL — ABNORMAL HIGH (ref 4.0–10.5)

## 2014-08-03 LAB — COMPREHENSIVE METABOLIC PANEL
ALT: 22 U/L (ref 14–54)
AST: 25 U/L (ref 15–41)
Albumin: 3.4 g/dL — ABNORMAL LOW (ref 3.5–5.0)
Alkaline Phosphatase: 121 U/L (ref 38–126)
Anion gap: 13 (ref 5–15)
BUN: 26 mg/dL — ABNORMAL HIGH (ref 6–20)
CO2: 24 mmol/L (ref 22–32)
Calcium: 9.2 mg/dL (ref 8.9–10.3)
Chloride: 100 mmol/L — ABNORMAL LOW (ref 101–111)
Creatinine, Ser: 2.08 mg/dL — ABNORMAL HIGH (ref 0.44–1.00)
GFR calc Af Amer: 28 mL/min — ABNORMAL LOW (ref >60)
GFR calc non Af Amer: 24 mL/min — ABNORMAL LOW (ref >60)
Glucose, Bld: 161 mg/dL — ABNORMAL HIGH (ref 65–99)
Potassium: 3.8 mmol/L (ref 3.5–5.1)
Sodium: 137 mmol/L (ref 135–145)
Total Bilirubin: 0.7 mg/dL (ref 0.3–1.2)
Total Protein: 8.2 g/dL — ABNORMAL HIGH (ref 6.5–8.1)

## 2014-08-03 LAB — URINE MICROSCOPIC-ADD ON

## 2014-08-03 MED ORDER — SODIUM CHLORIDE 0.9 % IV BOLUS (SEPSIS)
2000.0000 mL | Freq: Once | INTRAVENOUS | Status: AC
  Administered 2014-08-03: 2000 mL via INTRAVENOUS

## 2014-08-03 MED ORDER — SODIUM CHLORIDE 0.9 % IV SOLN
INTRAVENOUS | Status: DC
  Administered 2014-08-04 (×3): via INTRAVENOUS

## 2014-08-03 MED ORDER — ACETAMINOPHEN 650 MG RE SUPP
650.0000 mg | Freq: Four times a day (QID) | RECTAL | Status: DC | PRN
Start: 2014-08-03 — End: 2014-08-05

## 2014-08-03 MED ORDER — SERTRALINE HCL 100 MG PO TABS
200.0000 mg | ORAL_TABLET | Freq: Every day | ORAL | Status: DC
  Administered 2014-08-05: 200 mg via ORAL
  Filled 2014-08-03 (×2): qty 2

## 2014-08-03 MED ORDER — CEFTRIAXONE SODIUM IN DEXTROSE 20 MG/ML IV SOLN
1.0000 g | INTRAVENOUS | Status: DC
  Administered 2014-08-04 – 2014-08-05 (×2): 1 g via INTRAVENOUS
  Filled 2014-08-03 (×2): qty 50

## 2014-08-03 MED ORDER — ALUM & MAG HYDROXIDE-SIMETH 200-200-20 MG/5ML PO SUSP: 30.0000 mL | Freq: Four times a day (QID) | ORAL | Status: DC | PRN

## 2014-08-03 MED ORDER — ACETAMINOPHEN 325 MG PO TABS
650.0000 mg | ORAL_TABLET | Freq: Four times a day (QID) | ORAL | Status: DC | PRN
Start: 2014-08-03 — End: 2014-08-05

## 2014-08-03 MED ORDER — OXYCODONE HCL 5 MG PO TABS: 5.0000 mg | ORAL_TABLET | ORAL | Status: DC | PRN

## 2014-08-03 MED ORDER — SODIUM CHLORIDE 0.9 % IJ SOLN: 3.0000 mL | Freq: Two times a day (BID) | INTRAMUSCULAR | Status: DC

## 2014-08-03 MED ORDER — ALPRAZOLAM 0.5 MG PO TABS
0.5000 mg | ORAL_TABLET | Freq: Once | ORAL | Status: AC
  Administered 2014-08-03: 0.5 mg via ORAL
  Filled 2014-08-03: qty 1

## 2014-08-03 MED ORDER — ONDANSETRON HCL 4 MG PO TABS: 4.0000 mg | ORAL_TABLET | Freq: Four times a day (QID) | ORAL | Status: DC | PRN

## 2014-08-03 MED ORDER — SODIUM CHLORIDE 0.9 % IV SOLN
INTRAVENOUS | Status: AC
  Administered 2014-08-03: 19:00:00 via INTRAVENOUS

## 2014-08-03 MED ORDER — ONDANSETRON HCL 4 MG/2ML IJ SOLN
4.0000 mg | Freq: Four times a day (QID) | INTRAMUSCULAR | Status: DC | PRN
  Filled 2014-08-03: qty 2

## 2014-08-03 MED ORDER — ONDANSETRON HCL 4 MG/2ML IJ SOLN: 4.0000 mg | Freq: Once | INTRAMUSCULAR | Status: DC

## 2014-08-03 MED ORDER — HEPARIN SODIUM (PORCINE) 5000 UNIT/ML IJ SOLN
5000.0000 [IU] | Freq: Three times a day (TID) | INTRAMUSCULAR | Status: DC
  Administered 2014-08-04 – 2014-08-05 (×4): 5000 [IU] via SUBCUTANEOUS
  Filled 2014-08-03 (×7): qty 1

## 2014-08-03 NOTE — ED Provider Notes (Signed)
CSN: [id number]     Arrival date & time 08/03/14  1545 History  This chart was scribed for Sherwood Gambler, MD by Hansel Feinstein, ED Scribe. This patient was seen in room MH11/MH11 and the patient's care was started at 3:56 PM.     Chief Complaint  Patient presents with  . Dizziness   The history is provided by the patient and a relative. No language interpreter was used.    HPI Comments: [name] is a 64 y.o. female who presents to the Emergency Department complaining of intermittent dizziness onset 4 days ago. Pt states that she was sent to the ED by [facility] when they could not get any blood work today due to dehydration. She states that she was seen last week for a bladder infection and prescribed antibiotics. She reports associated intermittent abdominal pain, diarrhea, decreased urine, nausea, diaphoresis and vomiting since starting the antibiotic. Pt reports that she has not been able to keep any fluid down. Pt states that dizziness is relieved when sitting down. She describes the dizziness as feeling like she is about to pass out. She states one episode of emesis this morning. No known Hx of CHF or CAD. She denies dysuria, frequency, urgency, hematuria, HA, CP, fever, leg swelling and SOB.   Past Medical History  Diagnosis Date  . Depression   . Hypertension   . Anxiety   . GERD (gastroesophageal reflux disease)   . Hyperlipidemia   . Hernia     umbilical  . Arthritis    Past Surgical History  Procedure Laterality Date  . Back surgery      4 Bilateral Titanium Screws near L4 and L5  . Tonsillectomy    . Abdominal hysterectomy      Partial  . Cholecystectomy    . Heel spur excision Left   . Excision / curettage bone cyst phalanges of foot Right   . Spinal cord stimulator insertion  09/26/2012    Dr Rolena Infante  . Spinal cord stimulator insertion N/A 09/26/2012    Procedure: SPINAL CORD STIMULATOR PLACEMENT;  Surgeon: Melina Schools, MD;  Location: [facility];  Service:  Orthopedics;  Laterality: N/A;   Family History  Problem Relation Age of Onset  . Diabetes Mother   . Cancer Father     prostate  . Emphysema Maternal Grandfather    History  Substance Use Topics  . Smoking status: Former Smoker -- 0.25 packs/day for 30 years    Types: Cigarettes    Quit date: 07/26/2011  . Smokeless tobacco: Never Used     Comment: uses e-cigarette/ occ wine,  marijuna occ  . Alcohol Use: Yes     Comment: socially   OB History    No data available     Review of Systems  Constitutional: Positive for diaphoresis. Negative for fatigue.  Respiratory: Negative for shortness of breath.   Cardiovascular: Negative for chest pain and leg swelling.  Gastrointestinal: Positive for nausea, vomiting, abdominal pain and diarrhea.  Genitourinary: Positive for decreased urine volume. Negative for dysuria, urgency, frequency and hematuria.  Neurological: Positive for dizziness. Negative for headaches.  All other systems reviewed and are negative.  Allergies  Review of patient's allergies indicates no known allergies.  Home Medications   Prior to Admission medications   Medication Sig Start Date End Date Taking? Authorizing Provider  ALPRAZolam Duanne Moron) 0.5 MG tablet Take 0.25-0.5 mg by mouth 2 (two) times daily as needed. For anxiety    Historical Provider, MD  amLODipine-benazepril (LOTREL) 10-20 MG per capsule Take 1 capsule by mouth daily.    Historical Provider, MD  cholecalciferol (VITAMIN D) 400 UNITS TABS Take 400 Units by mouth daily.    Historical Provider, MD  docusate sodium (COLACE) 100 MG capsule Take 1 capsule (100 mg total) by mouth 3 (three) times daily as needed for constipation. 09/27/12   Melina Schools, MD  esomeprazole (NEXIUM) 20 MG capsule Take 20 mg by mouth 2 (two) times daily.    Historical Provider, MD  eszopiclone (LUNESTA) 2 MG TABS Take 3 mg by mouth at bedtime. Take immediately before bedtime    Historical Provider, MD  hydrochlorothiazide  (HYDRODIURIL) 25 MG tablet Take 25 mg by mouth daily.    Historical Provider, MD  HYDROcodone-acetaminophen (NORCO) 5-325 MG per tablet Take 1 tablet by mouth every 4 (four) hours as needed for pain. 09/27/12   Melina Schools, MD  ondansetron (ZOFRAN) 4 MG tablet Take 1 tablet (4 mg total) by mouth every 8 (eight) hours as needed for nausea. 09/27/12   Melina Schools, MD  polyethylene glycol powder (GLYCOLAX) powder Take 17 g by mouth daily. 09/27/12   Melina Schools, MD  sertraline (ZOLOFT) 100 MG tablet Take 200 mg by mouth daily.    Historical Provider, MD   Triage VS: BP 109/79 mmHg  Pulse 121  Temp(Src) 97.9 F (36.6 C) (Oral)  Resp 18  Ht 5\' 5"  (1.651 m)  Wt 207 lb (93.895 kg)  BMI 34.45 kg/m2  SpO2 99% Physical Exam  Constitutional: She is oriented to person, place, and time. She appears well-developed and well-nourished.  HENT:  Head: Normocephalic and atraumatic.  Right Ear: External ear normal.  Left Ear: External ear normal.  Nose: Nose normal.  Dry mucous membranes.   Eyes: Right eye exhibits no discharge. Left eye exhibits no discharge.  Cardiovascular: Regular rhythm and normal heart sounds.  Tachycardia present.   Pulmonary/Chest: Effort normal and breath sounds normal.  Abdominal: Soft. There is no tenderness.  Neurological: She is alert and oriented to person, place, and time.  Cranial nerves 2-12 normal. 5/5 strength of all 4 extremities. Normal finger movement.    Skin: Skin is warm and dry.  Nursing note and vitals reviewed.  ED Course  Procedures (including critical care time) DIAGNOSTIC STUDIES: Oxygen Saturation is 99% on RA, normal by my interpretation.    COORDINATION OF CARE: 4:04 PM Discussed treatment plan with pt at bedside and pt agreed to plan.   Labs Review Labs Reviewed  COMPREHENSIVE METABOLIC PANEL - Abnormal; Notable for the following:    Chloride 100 (*)    Glucose, Bld 161 (*)    BUN 26 (*)    Creatinine, Ser 2.08 (*)    Total Protein 8.2  (*)    Albumin 3.4 (*)    GFR calc non Af Amer 24 (*)    GFR calc Af Amer 28 (*)    All other components within normal limits  URINALYSIS, ROUTINE W REFLEX MICROSCOPIC (NOT AT [facility]) - Abnormal; Notable for the following:    Color, Urine AMBER (*)    APPearance TURBID (*)    Hgb urine dipstick SMALL (*)    Bilirubin Urine MODERATE (*)    Ketones, ur 15 (*)    Protein, ur 30 (*)    Leukocytes, UA LARGE (*)    All other components within normal limits  CBC WITH DIFFERENTIAL/PLATELET - Abnormal; Notable for the following:    WBC 13.7 (*)  Neutrophils Relative % 80 (*)    Neutro Abs 11.4 (*)    All other components within normal limits  URINE MICROSCOPIC-ADD ON - Abnormal; Notable for the following:    Squamous Epithelial / LPF FEW (*)    Bacteria, UA MANY (*)    Casts HYALINE CASTS (*)    Crystals CA OXALATE CRYSTALS (*)    All other components within normal limits  URINE CULTURE  CBC WITH DIFFERENTIAL/PLATELET    Imaging Review No results found.   EKG Interpretation   Date/Time:  Sunday August 03 2014 16:15:03 EDT Ventricular Rate:  109 PR Interval:  134 QRS Duration: 70 QT Interval:  330 QTC Calculation: 444 R Axis:   62 Text Interpretation:  Sinus tachycardia Otherwise normal ECG nonspecific T  wave abnormality from 2008 resolved Confirmed by [place]  ([id number]) on 08/03/2014 4:27:48 PM     MDM   Final diagnoses:  AKI (acute kidney injury)    Patient's lightheadedness appears to be from hypovolemia. She does have evidence of AKI with a creatinine of 2. Likely all prerenal given her vomiting and diarrhea. Patient was given IV fluids and she feels improved. However given the increasing creatinine she will be admitted to observation at [facility] long. Discussed with Dr. Coralyn Pear who accepts in transfer and admission. She does have bacteria in her urine but no urinary symptoms. Given she was already treated for a UTI and is otherwise asymptomatic, will send urine for  a culture but hold off on antibiotics at this time.  I personally performed the services described in this documentation, which was scribed in my presence. The recorded information has been reviewed and is accurate.   Sherwood Gambler, MD 08/03/14 2009

## 2014-08-03 NOTE — ED Notes (Signed)
Pt reports she went to her PCP today for follow up and to get labs checked- staff was unable to obtain blood and sent her here for dehydration- pt reports she has been feeling dizzy

## 2014-08-03 NOTE — ED Notes (Signed)
Patient reports that she went to [facility] to be seen for blood pressure medications, and the patient reports that she was unable to get the blood work due to dehydration. Patient was recently treated for a UTI and states that she is dizzy today

## 2014-08-03 NOTE — ED Notes (Signed)
Phone report given to Larkin Ina with [facility]- report called to [place], RN 3 Louise who updated pt's daughter (who is already at [facility]) on delay in transport

## 2014-08-03 NOTE — ED Notes (Signed)
Pt given ice chips- denies nausea- will call if she needs zofran

## 2014-08-04 DIAGNOSIS — N179 Acute kidney failure, unspecified: Secondary | ICD-10-CM | POA: Diagnosis present

## 2014-08-04 DIAGNOSIS — N390 Urinary tract infection, site not specified: Secondary | ICD-10-CM | POA: Diagnosis present

## 2014-08-04 DIAGNOSIS — Z79899 Other long term (current) drug therapy: Secondary | ICD-10-CM | POA: Diagnosis not present

## 2014-08-04 DIAGNOSIS — E861 Hypovolemia: Secondary | ICD-10-CM | POA: Diagnosis present

## 2014-08-04 DIAGNOSIS — R112 Nausea with vomiting, unspecified: Secondary | ICD-10-CM | POA: Diagnosis present

## 2014-08-04 DIAGNOSIS — R197 Diarrhea, unspecified: Secondary | ICD-10-CM | POA: Diagnosis present

## 2014-08-04 DIAGNOSIS — E860 Dehydration: Secondary | ICD-10-CM

## 2014-08-04 DIAGNOSIS — I10 Essential (primary) hypertension: Secondary | ICD-10-CM

## 2014-08-04 DIAGNOSIS — E785 Hyperlipidemia, unspecified: Secondary | ICD-10-CM | POA: Diagnosis present

## 2014-08-04 DIAGNOSIS — F329 Major depressive disorder, single episode, unspecified: Secondary | ICD-10-CM | POA: Diagnosis present

## 2014-08-04 DIAGNOSIS — Z9049 Acquired absence of other specified parts of digestive tract: Secondary | ICD-10-CM | POA: Diagnosis present

## 2014-08-04 DIAGNOSIS — F419 Anxiety disorder, unspecified: Secondary | ICD-10-CM | POA: Diagnosis present

## 2014-08-04 DIAGNOSIS — Z87891 Personal history of nicotine dependence: Secondary | ICD-10-CM | POA: Diagnosis not present

## 2014-08-04 DIAGNOSIS — Z825 Family history of asthma and other chronic lower respiratory diseases: Secondary | ICD-10-CM | POA: Diagnosis not present

## 2014-08-04 DIAGNOSIS — Z833 Family history of diabetes mellitus: Secondary | ICD-10-CM | POA: Diagnosis not present

## 2014-08-04 DIAGNOSIS — K219 Gastro-esophageal reflux disease without esophagitis: Secondary | ICD-10-CM | POA: Diagnosis present

## 2014-08-04 LAB — CBC
HCT: 34 % — ABNORMAL LOW (ref 36.0–46.0)
HCT: 36.1 % (ref 36.0–46.0)
Hemoglobin: 11 g/dL — ABNORMAL LOW (ref 12.0–15.0)
Hemoglobin: 11.8 g/dL — ABNORMAL LOW (ref 12.0–15.0)
MCH: 29.7 pg (ref 26.0–34.0)
MCH: 29.8 pg (ref 26.0–34.0)
MCHC: 32.4 g/dL (ref 30.0–36.0)
MCHC: 32.7 g/dL (ref 30.0–36.0)
MCV: 91.2 fL (ref 78.0–100.0)
MCV: 91.9 fL (ref 78.0–100.0)
Platelets: 335 10*3/uL (ref 150–400)
Platelets: 383 10*3/uL (ref 150–400)
RBC: 3.7 MIL/uL — ABNORMAL LOW (ref 3.87–5.11)
RBC: 3.96 MIL/uL (ref 3.87–5.11)
RDW: 14.2 % (ref 11.5–15.5)
RDW: 14.5 % (ref 11.5–15.5)
WBC: 10.9 10*3/uL — ABNORMAL HIGH (ref 4.0–10.5)
WBC: 12 10*3/uL — ABNORMAL HIGH (ref 4.0–10.5)

## 2014-08-04 LAB — BASIC METABOLIC PANEL
Anion gap: 7 (ref 5–15)
BUN: 20 mg/dL (ref 6–20)
CO2: 23 mmol/L (ref 22–32)
Calcium: 8.2 mg/dL — ABNORMAL LOW (ref 8.9–10.3)
Chloride: 106 mmol/L (ref 101–111)
Creatinine, Ser: 1.14 mg/dL — ABNORMAL HIGH (ref 0.44–1.00)
GFR calc Af Amer: 58 mL/min — ABNORMAL LOW (ref >60)
GFR calc non Af Amer: 50 mL/min — ABNORMAL LOW (ref >60)
Glucose, Bld: 115 mg/dL — ABNORMAL HIGH (ref 65–99)
Potassium: 3.6 mmol/L (ref 3.5–5.1)
Sodium: 136 mmol/L (ref 135–145)

## 2014-08-04 LAB — CREATININE, SERUM
Creatinine, Ser: 1.39 mg/dL — ABNORMAL HIGH (ref 0.44–1.00)
GFR calc Af Amer: 46 mL/min — ABNORMAL LOW (ref >60)
GFR calc non Af Amer: 39 mL/min — ABNORMAL LOW (ref >60)

## 2014-08-04 LAB — CLOSTRIDIUM DIFFICILE BY PCR: Toxigenic C. Difficile by PCR: NEGATIVE

## 2014-08-04 MED ORDER — ZOLPIDEM TARTRATE 5 MG PO TABS
5.0000 mg | ORAL_TABLET | Freq: Every evening | ORAL | Status: DC | PRN
  Administered 2014-08-04 – 2014-08-05 (×2): 5 mg via ORAL
  Filled 2014-08-04 (×2): qty 1

## 2014-08-04 MED ORDER — AMLODIPINE BESYLATE 10 MG PO TABS
10.0000 mg | ORAL_TABLET | Freq: Every day | ORAL | Status: DC
  Filled 2014-08-04 (×2): qty 1

## 2014-08-04 NOTE — H&P (Signed)
[facility] History and Physical  [name] KDX:[id number] DOB: 09-Mar-1950 DOA: 08/03/2014  Referring physician:  PCP: PROVIDER NOT IN SYSTEM   Chief Complaint: Nausea/vomiting/generalized weakness  HPI: [name] is a 64 y.o. female with a past medical history of hypertension, presenting as a transfer from [place] where she originally presented with complaints of nausea vomiting associate with epigastric pain. She reports being diagnosed with a urinary tract infection last week where she was prescribed to antibiotics (she cannot recall the names of the antibiotic's prescribed stating one she took 3 times daily in the other twice daily) fill her prescription on Sunday. She reported on Tuesday having multiple episodes of nausea and vomiting associated with epigastric pain. Symptoms persisted over the following days as she felt becoming weaker and having difficulties tolerating by mouth intake. She also developed diarrhea having liquid consistency bowel movements during this time. She is unaware of any drug allergies. Denies fevers, chills, chest pain, shortness of breath, dysuria, hematuria. Lab work performed at [facility] revealed the development of acute kidney injury with creatinine of 2.08.                                             Review of Systems:  Constitutional:  No weight loss, night sweats, Fevers, chills, fatigue.  HEENT:  No headaches, Difficulty swallowing,Tooth/dental problems,Sore throat,  No sneezing, itching, ear ache, nasal congestion, post nasal drip,  Cardio-vascular:  No chest pain, Orthopnea, PND, swelling in lower extremities, anasarca, dizziness, palpitations  GI:  Positive forheartburn, indigestion, abdominal pain, nausea, vomiting, diarrhea, change in bowel habits, loss of appetite  Resp:  No shortness of breath with exertion or at rest. No excess mucus, no productive cough, No non-productive cough, No coughing up of blood.No change in color of  mucus.No wheezing.No chest wall deformity  Skin:  no rash or lesions.  GU:  no dysuria, change in color of urine, no urgency or frequency. No flank pain.  Musculoskeletal:  No joint pain or swelling. No decreased range of motion. No back pain.  Psych:  No change in mood or affect. No depression or anxiety. No memory loss.   Past Medical History  Diagnosis Date  . Depression   . Hypertension   . Anxiety   . GERD (gastroesophageal reflux disease)   . Hyperlipidemia   . Hernia     umbilical  . Arthritis    Past Surgical History  Procedure Laterality Date  . Back surgery      4 Bilateral Titanium Screws near L4 and L5  . Tonsillectomy    . Abdominal hysterectomy      Partial  . Cholecystectomy    . Heel spur excision Left   . Excision / curettage bone cyst phalanges of foot Right   . Spinal cord stimulator insertion  09/26/2012    Dr Rolena Infante  . Spinal cord stimulator insertion N/A 09/26/2012    Procedure: SPINAL CORD STIMULATOR PLACEMENT;  Surgeon: Melina Schools, MD;  Location: [facility];  Service: Orthopedics;  Laterality: N/A;   Social History:  reports that she quit smoking about 3 years ago. Her smoking use included Cigarettes. She has a 7.5 pack-year smoking history. She has never used smokeless tobacco. She reports that she drinks alcohol. She reports that she does not use illicit drugs.  No Known Allergies  Family History  Problem Relation  Age of Onset  . Diabetes Mother   . Cancer Father     prostate  . Emphysema Maternal Grandfather     Prior to Admission medications   Medication Sig Start Date End Date Taking? Authorizing Provider  ALPRAZolam Duanne Moron) 0.5 MG tablet Take 0.25-0.5 mg by mouth 2 (two) times daily as needed. For anxiety    Historical Provider, MD  amLODipine-benazepril (LOTREL) 10-20 MG per capsule Take 1 capsule by mouth daily.    Historical Provider, MD  cholecalciferol (VITAMIN D) 400 UNITS TABS Take 400 Units by mouth daily.    Historical Provider,  MD  docusate sodium (COLACE) 100 MG capsule Take 1 capsule (100 mg total) by mouth 3 (three) times daily as needed for constipation. 09/27/12   Melina Schools, MD  esomeprazole (NEXIUM) 20 MG capsule Take 20 mg by mouth 2 (two) times daily.    Historical Provider, MD  eszopiclone (LUNESTA) 2 MG TABS Take 3 mg by mouth at bedtime. Take immediately before bedtime    Historical Provider, MD  hydrochlorothiazide (HYDRODIURIL) 25 MG tablet Take 25 mg by mouth daily.    Historical Provider, MD  HYDROcodone-acetaminophen (NORCO) 5-325 MG per tablet Take 1 tablet by mouth every 4 (four) hours as needed for pain. 09/27/12   Melina Schools, MD  ondansetron (ZOFRAN) 4 MG tablet Take 1 tablet (4 mg total) by mouth every 8 (eight) hours as needed for nausea. 09/27/12   Melina Schools, MD  polyethylene glycol powder (GLYCOLAX) powder Take 17 g by mouth daily. 09/27/12   Melina Schools, MD  sertraline (ZOLOFT) 100 MG tablet Take 200 mg by mouth daily.    Historical Provider, MD   Physical Exam: Filed Vitals:   08/03/14 2100 08/03/14 2130 08/03/14 2250 08/03/14 2345  BP: 102/72 121/67 134/77 133/90  Pulse: 80 89 79 96  Temp:   98.7 F (37.1 C) 98.3 F (36.8 C)  TempSrc:   Oral Oral  Resp:   18 16  Height:    5\' 5"  (1.651 m)  Weight:    95.709 kg (211 lb)  SpO2: 100% 100% 100% 100%    Wt Readings from Last 3 Encounters:  08/03/14 95.709 kg (211 lb)  09/24/12 103.964 kg (229 lb 3.2 oz)  05/09/12 101.152 kg (223 lb)    General:  Appears calm and comfortable, she is nontoxic and in no acute distress  Eyes: PERRL, normal lids, irises & conjunctiva ENT: grossly normal hearing, lips & tongue Neck: no LAD, masses or thyromegaly Cardiovascular: RRR, no m/r/g. No LE edema. Telemetry: SR, no arrhythmias  Respiratory: CTA bilaterally, no w/r/r. Normal respiratory effort. Abdomen: soft, ntnd Skin: no rash or induration seen on limited exam Musculoskeletal: grossly normal tone BUE/BLE Psychiatric: grossly normal  mood and affect, speech fluent and appropriate Neurologic: grossly non-focal.          Labs on Admission:  Basic Metabolic Panel:  Recent Labs Lab 08/03/14 1625  NA 137  K 3.8  CL 100*  CO2 24  GLUCOSE 161*  BUN 26*  CREATININE 2.08*  CALCIUM 9.2   Liver Function Tests:  Recent Labs Lab 08/03/14 1625  AST 25  ALT 22  ALKPHOS 121  BILITOT 0.7  PROT 8.2*  ALBUMIN 3.4*   No results for input(s): LIPASE, AMYLASE in the last 168 hours. No results for input(s): AMMONIA in the last 168 hours. CBC:  Recent Labs Lab 08/03/14 1740  WBC 13.7*  NEUTROABS 11.4*  HGB 13.8  HCT 41.7  MCV 88.9  PLT 385   Cardiac Enzymes: No results for input(s): CKTOTAL, CKMB, CKMBINDEX, TROPONINI in the last 168 hours.  BNP (last 3 results) No results for input(s): BNP in the last 8760 hours.  ProBNP (last 3 results) No results for input(s): PROBNP in the last 8760 hours.  CBG: No results for input(s): GLUCAP in the last 168 hours.  Radiological Exams on Admission: No results found.  EKG: Independently reviewed.   Assessment/Plan Principal Problem:   AKI (acute kidney injury) Active Problems:   HTN (hypertension)   GERD (gastroesophageal reflux disease)   Nausea with vomiting   Dehydration   1. Acute kidney injury. Patient presenting with complaints of nausea and vomiting having lab work earlier today showing a creatinine of 2.08 with BUN of 26. Acute kidney injury likely secondary to hypovolemia/prerenal azotemia resulting from GI losses. Will provide IV fluid resuscitation with normal saline along with as needed IV anti-medic therapy for N/V. Will discontinue hydrochlorothiazide as this likely added to her dehydration. Repeat lab work in a.m.  2.  Nausea/vomiting/diarrhea. She reports being prescribed to antibiotic a week ago for urinary tract infection. She cannot tell me which to antibiotic she was given. It is possible that GI symptoms may have been related to adverse  reaction from antibiotic therapy. Since she reported diarrhea will check a stool for C. Difficile. As mentioned above will provide IV fluid resuscitation, supportive care, repeat labs in a.m. 3. Hypertension. Due to development of renal failure will discontinue ACE inhibitor therapy along with diaphoretic. Will continue amlodipine at 10 mg by mouth daily. Continue Norvasc 10 mg by mouth daily. 4. Urinary tract infection. Urinalysis showing the presence of many bacteria along with leukocytes. I wonder about the possibility of incompletely treated UTI from patient's nausea/vomiting and inability to hold by mouth down. Will treat with ceftriaxone 1 g IV every 24 hours. Follow-up on urine cultures. 5. Nutrition. Clear liquid diet 6. DVT prophylaxis. Subcutaneous heparin   Code Status full code  Family Communication:I spoke with her daughter was present at bedside  Disposition Plan: Will place in overnight observation   Time spent: 55 minutes   Kelvin Cellar [facility] Pager [phone]

## 2014-08-04 NOTE — Plan of Care (Signed)
Problem: Consults Goal: Skin Care Protocol Initiated - if Braden Score 18 or less If consults are not indicated, leave blank or document N/A Outcome: Not Applicable Date Met:  41/58/30 Braden scale is >18.

## 2014-08-04 NOTE — Progress Notes (Signed)
Progress Note   [name] [id number]:[id number] DOB: 02/16/1950 DOA: 08/03/2014 PCP: PROVIDER NOT IN SYSTEM   Brief Narrative:   [name] is an 64 y.o. female with a PMH of hypertension who was admitted 08/03/14 with acute kidney injury secondary to nausea, vomiting, diarrhea after recent treatment with antibiotics for a UTI.  Assessment/Plan:   Principal Problem:   AKI (acute kidney injury) secondary to dehydration from nausea and vomiting and diarrhea - Hydrated overnight. Creatinine improving. - Continue to hold HCTZ. - Continue conservative care with antinausea medications and IV fluids. - Check stools for C. difficile PCR given recent antibiotics use.  Active Problems:   Urinary tract infection - On empiric Rocephin. Follow-up cultures.    HTN (hypertension) - ACE inhibitor and diuretics on hold. Continue amlodipine 10 mg daily. Blood pressure controlled.    GERD (gastroesophageal reflux disease) - Mylanta ordered as needed.    DVT Prophylaxis - Continue subcutaneous heparin.  Code Status: Full. Family Communication: No family at the bedside. Disposition Plan: Home 08/05/14 if she can tolerate diet, and UCUX back.   IV Access:    Peripheral IV   Procedures and diagnostic studies:   No results found.   Medical Consultants:    None.  Anti-Infectives:    Rocephin 08/03/14--->  Subjective:   [name] denies nausea and vomiting.  Stools are beginning to "firm up" but remain loose.  Denies abdominal pain.  Objective:    Filed Vitals:   08/03/14 2130 08/03/14 2250 08/03/14 2345 08/04/14 0454  BP: 121/67 134/77 133/90 105/65  Pulse: 89 79 96 72  Temp:  98.7 F (37.1 C) 98.3 F (36.8 C) 97.9 F (36.6 C)  TempSrc:  Oral Oral Oral  Resp:  18 16 20   Height:   5\' 5"  (1.651 m)   Weight:   95.709 kg (211 lb)   SpO2: 100% 100% 100% 99%    Intake/Output Summary (Last 24 hours) at 08/04/14 0715 Last data filed at 08/03/14 2018  Gross  per 24 hour  Intake   2000 ml  Output      0 ml  Net   2000 ml    Exam: Gen:  NAD Cardiovascular:  RRR, No M/R/G Respiratory:  Lungs CTAB Gastrointestinal:  Abdomen soft, NT/ND, + BS Extremities:  No C/E/C   Data Reviewed:    Labs: Basic Metabolic Panel:  Recent Labs Lab 08/03/14 1625 08/04/14 0020 08/04/14 0625  NA 137  --  136  K 3.8  --  3.6  CL 100*  --  106  CO2 24  --  23  GLUCOSE 161*  --  115*  BUN 26*  --  20  CREATININE 2.08* 1.39* 1.14*  CALCIUM 9.2  --  8.2*   GFR Estimated Creatinine Clearance: 57.8 mL/min (by C-G formula based on Cr of 1.14). Liver Function Tests:  Recent Labs Lab 08/03/14 1625  AST 25  ALT 22  ALKPHOS 121  BILITOT 0.7  PROT 8.2*  ALBUMIN 3.4*   CBC:  Recent Labs Lab 08/03/14 1740 08/04/14 0020 08/04/14 0625  WBC 13.7* 12.0* 10.9*  NEUTROABS 11.4*  --   --   HGB 13.8 11.8* 11.0*  HCT 41.7 36.1 34.0*  MCV 88.9 91.2 91.9  PLT 385 383 335    Microbiology No results found for this or any previous visit (from the past 240 hour(s)).   Medications:   . sodium chloride   Intravenous STAT  . amLODipine  10 mg Oral Daily  . cefTRIAXone (ROCEPHIN)  IV  1 g Intravenous Q24H  . heparin  5,000 Units Subcutaneous 3 times per day  . sertraline  200 mg Oral Daily  . sodium chloride  3 mL Intravenous Q12H   Continuous Infusions: . sodium chloride 100 mL/hr at 08/04/14 0318    Time spent: 25 minutes.     [facility] Pager [phone]. If unable to reach me by pager, please call my cell phone at [phone].  *Please refer to [email or web address], password [email or web address] to get updated schedule on who will round on this patient, as hospitalists switch teams weekly. If 7PM-7AM, please contact night-coverage at [email or web address], password [email or web address] for any overnight needs.  08/04/2014, 7:15 AM

## 2014-08-04 NOTE — Plan of Care (Signed)
Problem: Consults Goal: General Medical Patient Education See Patient Education Module for specific education. Outcome: Progressing Pt instructed on not taking meds from home, but take meds offered by hospital. Pt also instructed on c-diff precautions.

## 2014-08-05 DIAGNOSIS — K219 Gastro-esophageal reflux disease without esophagitis: Secondary | ICD-10-CM

## 2014-08-05 LAB — BASIC METABOLIC PANEL
Anion gap: 12 (ref 5–15)
BUN: 11 mg/dL (ref 6–20)
CO2: 20 mmol/L — ABNORMAL LOW (ref 22–32)
Calcium: 8.7 mg/dL — ABNORMAL LOW (ref 8.9–10.3)
Chloride: 106 mmol/L (ref 101–111)
Creatinine, Ser: 0.83 mg/dL (ref 0.44–1.00)
GFR calc Af Amer: >60 mL/min (ref >60)
GFR calc non Af Amer: >60 mL/min (ref >60)
Glucose, Bld: 106 mg/dL — ABNORMAL HIGH (ref 65–99)
Potassium: 3.7 mmol/L (ref 3.5–5.1)
Sodium: 138 mmol/L (ref 135–145)

## 2014-08-05 MED ORDER — CEFUROXIME AXETIL 250 MG PO TABS: 250.0000 mg | ORAL_TABLET | Freq: Two times a day (BID) | ORAL | Status: DC

## 2014-08-05 MED ORDER — ONDANSETRON HCL 4 MG PO TABS: 4.0000 mg | ORAL_TABLET | Freq: Four times a day (QID) | ORAL | Status: DC | PRN

## 2014-08-05 NOTE — Progress Notes (Signed)
Patient d/c home. Stable. 

## 2014-08-05 NOTE — Progress Notes (Signed)
This nurse rendered discharge instructions to patient,prescription given, verbalized understanding. Denies pain. Ambulatory, denies burning on urination.

## 2014-08-05 NOTE — Discharge Summary (Signed)
Physician Discharge Summary  [name] KLK:[id number] DOB: Aug 14, 1950 DOA: 08/03/2014  PCP: Imelda Pillow, NP  Admit date: 08/03/2014 Discharge date: 08/05/2014   Recommendations for Outpatient Follow-Up:   1. PCP please follow-up on final urine culture and blood culture results. The patient was treated with Ceftin at discharge. Please make sure that if her cultures are positive, the organism is sensitive to Ceftin.   Discharge Diagnosis:   Principal Problem:    AKI (acute kidney injury) Active Problems:    HTN (hypertension)    GERD (gastroesophageal reflux disease)    Nausea with vomiting    Dehydration    Urinary tract infection   Discharge disposition:  Home.    Discharge Condition: Improved.  Diet recommendation: Low sodium, heart healthy.    History of Present Illness:   [name] is an 64 y.o. female with a PMH of hypertension who was admitted 08/03/14 with acute kidney injury secondary to nausea, vomiting, diarrhea after recent treatment with antibiotics for a UTI.   Hospital Course by Problem:   Principal Problem:  AKI (acute kidney injury) secondary to dehydration from nausea and vomiting and diarrhea - Creatinine normalized with hydration and holding ACE inhibitor and HCTZ. - Treated with conservative care with antinausea medications and IV fluids. - C. difficile negative. - Nausea, vomiting and diarrhea resolved at discharge.  Active Problems:  Urinary tract infection - On empiric Rocephin. Discharge home on 3 additional days of treatment with Ceftin. - Follow-up final urine culture results.   HTN (hypertension) - ACE inhibitor and diuretics held, okay to resume at discharge.  - Continue amlodipine 10 mg daily. Blood pressure controlled.   GERD (gastroesophageal reflux disease) - Mylanta ordered as needed.    Medical Consultants:    None.   Discharge Exam:   Filed Vitals:   08/05/14 0846  BP: 127/74  Pulse: 74   Temp:   Resp:    Filed Vitals:   08/04/14 1345 08/04/14 2145 08/05/14 0630 08/05/14 0846  BP: 109/75 136/88 129/82 127/74  Pulse: 74 85 73 74  Temp: 98.1 F (36.7 C) 98.3 F (36.8 C) 98 F (36.7 C)   TempSrc: Oral Oral Oral   Resp: 18 20 20    Height:      Weight:      SpO2: 100% 100% 96%     Gen:  NAD Cardiovascular:  RRR, No M/R/G Respiratory: Lungs CTAB Gastrointestinal: Abdomen soft, NT/ND with normal active bowel sounds. Extremities: No C/E/C   The results of significant diagnostics from this hospitalization (including imaging, microbiology, ancillary and laboratory) are listed below for reference.     Procedures and Diagnostic Studies:   No results found.   Labs:   Basic Metabolic Panel:  Recent Labs Lab 08/03/14 1625 08/04/14 0020 08/04/14 0625 08/05/14 0400  NA 137  --  136 138  K 3.8  --  3.6 3.7  CL 100*  --  106 106  CO2 24  --  23 20*  GLUCOSE 161*  --  115* 106*  BUN 26*  --  20 11  CREATININE 2.08* 1.39* 1.14* 0.83  CALCIUM 9.2  --  8.2* 8.7*   GFR Estimated Creatinine Clearance: 79.4 mL/min (by C-G formula based on Cr of 0.83). Liver Function Tests:  Recent Labs Lab 08/03/14 1625  AST 25  ALT 22  ALKPHOS 121  BILITOT 0.7  PROT 8.2*  ALBUMIN 3.4*   CBC:  Recent Labs Lab 08/03/14 1740 08/04/14 0020 08/04/14 9794  WBC 13.7* 12.0* 10.9*  NEUTROABS 11.4*  --   --   HGB 13.8 11.8* 11.0*  HCT 41.7 36.1 34.0*  MCV 88.9 91.2 91.9  PLT 385 383 335   Microbiology Recent Results (from the past 240 hour(s))  Urine culture     Status: None (Preliminary result)   Collection Time: 08/03/14  5:33 PM  Result Value Ref Range Status   Specimen Description URINE, CLEAN CATCH  Final   Special Requests NONE  Final   Culture   Final    CULTURE REINCUBATED FOR BETTER GROWTH Performed at [facility]    Report Status PENDING  Incomplete  Clostridium Difficile by PCR (not at [facility])     Status: None   Collection Time: 08/04/14  11:36 AM  Result Value Ref Range Status   C difficile by pcr NEGATIVE NEGATIVE Final     Discharge Instructions:   Discharge Instructions    Call MD for:  persistant nausea and vomiting    Complete by:  As directed      Call MD for:  severe uncontrolled pain    Complete by:  As directed      Call MD for:  temperature >100.4    Complete by:  As directed      Diet - low sodium heart healthy    Complete by:  As directed      Increase activity slowly    Complete by:  As directed             Medication List    TAKE these medications        ALPRAZolam 0.5 MG tablet  Commonly known as:  XANAX  Take 0.25-0.5 mg by mouth 2 (two) times daily as needed. For anxiety     amLODipine-benazepril 10-20 MG per capsule  Commonly known as:  LOTREL  Take 1 capsule by mouth daily.     BC HEADACHE POWDER PO  Take 1 Package by mouth every 6 (six) hours as needed (pain).     cefUROXime 250 MG tablet  Commonly known as:  CEFTIN  Take 1 tablet (250 mg total) by mouth 2 (two) times daily with a meal.     cholecalciferol 400 UNITS Tabs tablet  Commonly known as:  VITAMIN D  Take 400 Units by mouth daily.     eszopiclone 2 MG Tabs tablet  Commonly known as:  LUNESTA  Take 3 mg by mouth at bedtime as needed for sleep. Take immediately before bedtime     hydrochlorothiazide 25 MG tablet  Commonly known as:  HYDRODIURIL  Take 25 mg by mouth daily.     ondansetron 4 MG tablet  Commonly known as:  ZOFRAN  Take 1 tablet (4 mg total) by mouth every 6 (six) hours as needed for nausea.     sertraline 100 MG tablet  Commonly known as:  ZOLOFT  Take 200 mg by mouth daily.           Follow-up Information    Schedule an appointment as soon as possible for a visit with Millsaps, Luane School, NP.   Why:  If symptoms worsen.   Contact information:   [facility] [place] [address] [phone]        Time coordinating discharge: 25  minutes.  Signed:  RAMA,CHRISTINA  Pager [phone] [facility] 08/05/2014, 12:41 PM

## 2014-08-06 LAB — CBC WITH DIFFERENTIAL/PLATELET

## 2014-08-06 LAB — URINE CULTURE: Culture: 20000

## 2017-08-24 ENCOUNTER — Encounter: Payer: Self-pay | Admitting: Physician Assistant

## 2018-07-27 ENCOUNTER — Other Ambulatory Visit: Payer: Self-pay | Admitting: Chiropractic Medicine

## 2018-07-27 DIAGNOSIS — Z981 Arthrodesis status: Secondary | ICD-10-CM

## 2018-08-13 ENCOUNTER — Ambulatory Visit
Admission: RE | Admit: 2018-08-13 | Discharge: 2018-08-13 | Disposition: A | Discharge status: Home / Self Care | Payer: Medicare Other | Source: Ambulatory Visit | Attending: Chiropractic Medicine | Admitting: Chiropractic Medicine

## 2018-08-13 DIAGNOSIS — Z981 Arthrodesis status: Secondary | ICD-10-CM

## 2019-02-04 ENCOUNTER — Ambulatory Visit: Payer: Medicare Other | Attending: Internal Medicine

## 2019-02-04 DIAGNOSIS — Z20822 Contact with and (suspected) exposure to covid-19: Secondary | ICD-10-CM

## 2019-02-05 LAB — NOVEL CORONAVIRUS, NAA: SARS-CoV-2, NAA: NOT DETECTED

## 2019-02-11 ENCOUNTER — Telehealth: Payer: Self-pay

## 2019-02-11 NOTE — Telephone Encounter (Signed)
Caller given negative result and verbalized understanding  

## 2019-03-18 DEATH — deceased

## 2019-06-07 ENCOUNTER — Other Ambulatory Visit: Payer: Self-pay | Admitting: *Deleted

## 2019-06-07 DIAGNOSIS — Z1231 Encounter for screening mammogram for malignant neoplasm of breast: Secondary | ICD-10-CM

## 2019-08-06 ENCOUNTER — Ambulatory Visit
Admission: RE | Admit: 2019-08-06 | Discharge: 2019-08-06 | Disposition: A | Discharge status: Home / Self Care | Payer: Medicare PPO | Source: Ambulatory Visit | Attending: *Deleted | Admitting: *Deleted

## 2019-08-06 ENCOUNTER — Other Ambulatory Visit: Payer: Self-pay

## 2019-08-06 DIAGNOSIS — Z1231 Encounter for screening mammogram for malignant neoplasm of breast: Secondary | ICD-10-CM

## 2019-08-09 ENCOUNTER — Other Ambulatory Visit: Payer: Self-pay | Admitting: *Deleted

## 2019-08-09 DIAGNOSIS — R928 Other abnormal and inconclusive findings on diagnostic imaging of breast: Secondary | ICD-10-CM

## 2019-08-21 ENCOUNTER — Other Ambulatory Visit: Payer: Self-pay | Admitting: *Deleted

## 2019-08-21 ENCOUNTER — Ambulatory Visit
Admission: RE | Admit: 2019-08-21 | Discharge: 2019-08-21 | Disposition: A | Discharge status: Home / Self Care | Payer: Medicare PPO | Source: Ambulatory Visit | Attending: *Deleted | Admitting: *Deleted

## 2019-08-21 ENCOUNTER — Other Ambulatory Visit: Payer: Self-pay

## 2019-08-21 DIAGNOSIS — R928 Other abnormal and inconclusive findings on diagnostic imaging of breast: Secondary | ICD-10-CM

## 2019-08-21 DIAGNOSIS — N6310 Unspecified lump in the right breast, unspecified quadrant: Secondary | ICD-10-CM

## 2019-08-21 DIAGNOSIS — R599 Enlarged lymph nodes, unspecified: Secondary | ICD-10-CM

## 2019-09-02 ENCOUNTER — Ambulatory Visit
Admission: RE | Admit: 2019-09-02 | Discharge: 2019-09-02 | Disposition: A | Discharge status: Home / Self Care | Payer: Medicare PPO | Source: Ambulatory Visit | Attending: *Deleted | Admitting: *Deleted

## 2019-09-02 ENCOUNTER — Other Ambulatory Visit: Payer: Self-pay

## 2019-09-02 DIAGNOSIS — R599 Enlarged lymph nodes, unspecified: Secondary | ICD-10-CM

## 2019-09-02 DIAGNOSIS — N6310 Unspecified lump in the right breast, unspecified quadrant: Secondary | ICD-10-CM

## 2019-09-02 HISTORY — PX: BREAST BIOPSY: SHX20

## 2019-09-09 ENCOUNTER — Ambulatory Visit: Payer: Self-pay | Admitting: Surgery

## 2019-09-09 DIAGNOSIS — Z170 Estrogen receptor positive status [ER+]: Secondary | ICD-10-CM

## 2019-09-09 DIAGNOSIS — C50911 Malignant neoplasm of unspecified site of right female breast: Secondary | ICD-10-CM

## 2019-09-09 NOTE — H&P (View-Only) (Signed)
Gerlene Fee Appointment: 09/09/2019 9:20 AM Location: [facility] Patient #: [id number] DOB: [birth year] Divorced / Language: Cleophus Molt / Race: Black or African American Female  History of Present Illness Marcello Moores A. Ocie Tino MD; 09/09/2019 12:51 PM) Patient words: Patient presents for evaluation of right breast mass detected on screening mammogram. A 9 mm mass was detected right breast upper outer quadrant on routine screening mammography. 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 PROGNOSTIC INDICATORS Results: IMMUNOHISTOCHEMICAL AND MORPHOMETRIC ANALYSIS PERFORMED MANUALLY The tumor cells are NEGATIVE for Her2 (1+). Estrogen Receptor: 70%, POSITIVE, MODERATE STAINING INTENSITY Progesterone Receptor: 0%, NEGATIVE Proliferation Marker Ki67: 85% COMMENT: The negative hormone receptor study(ies) in this case has an internal positive control. REFERENCE RANGE ESTROGEN RECEPTOR NEGATIVE 0% POSITIVE =>1% REFERENCE RANGE PROGESTERONE RECEPTOR NEGATIVE 0% POSITIVE =>1% All controls stained appropriately Vicente Males MD Pathologist, Electronic Signature ( Signed 09/04/2019) FINAL DIAGNOSIS 1 of 3 FINAL for LOUANNE, CALVILLO ([id number]) Diagnosis 1. Breast, right, needle core biopsy, 12:30 o'clock - INVASIVE DUCTAL CARCINOMA, SEE COMMENT. 2. Lymph node, needle/core biopsy, right axilla - REACTIVE LYMPH NODE WITH DERMATOPATHIC CHANGE. Microscopic Comment 1. The carcinoma appears grade 3 and measures 7 mm in greatest linear extent. Prognostic makers will be ordered. Dr. Jeannie Done has reviewed the case. The case was called to [facility] on 09/03/2019. Vicente Males MD Pathologist, Electronic Signature (Case signed 09/03/2019)     The patient was called back from screening mammography due to a right breast mass.  EXAM: DIGITAL  DIAGNOSTIC RIGHT MAMMOGRAM WITH TOMO  ULTRASOUND RIGHT BREAST  COMPARISON: Recent screening mammography  ACR Breast Density Category a: The breast tissue is almost entirely fatty.  FINDINGS: There is an irregular mass at approximately 12 o'clock in the right breast at a mid depth.  On physical exam, no suspicious lumps are identified.  Targeted ultrasound is performed, showing an irregular mass in the right breast at 12:30, 7 cm from the nipple measuring 9 x 6 by 10 mm. There is a mildly abnormal lymph node with a cortex measuring 4.4 mm in the right axilla.  IMPRESSION: Suspicious mass in the right breast at 12:30, 7 cm from the nipple. Mildly abnormal right axillary lymph node.  RECOMMENDATION: Recommend ultrasound-guided biopsy of the 12:30 right breast mass and the mildly abnormal node in the right axilla.  I have discussed the findings and recommendations with the patient. If applicable, a reminder letter will be sent to the patient regarding the next appointment.  BI-RADS CATEGORY 4: Suspicious.   Electronically Signed By: Dorise Bullion III M.D On: 08/21/2019 14:58.  The patient is a 69 year old female.   Past Surgical History Sharyn Lull R. Brooks, CMA; 09/09/2019 10:53 AM) Breast Biopsy Right. Colon Polyp Removal - Colonoscopy Foot Surgery Bilateral. Gallbladder Surgery - Laparoscopic Hysterectomy (not due to cancer) - Partial Spinal Surgery - Lower Back  Diagnostic Studies History Sharyn Lull R. Brooks, CMA; 09/09/2019 10:53 AM) Colonoscopy 5-10 years ago Mammogram within last year Pap Smear >5 years ago  Allergies Darden Palmer, RMA; 09/09/2019 9:36 AM) No Known Drug Allergies [09/09/2019]:  Medication History Darden Palmer, RMA; 09/09/2019 9:37 AM) Sertraline HCl (100MG Tablet, Oral) Active. Rosuvastatin Calcium (10MG Tablet, Oral) Active. hydroCHLOROthiazide (25MG Tablet, Oral) Active. amLODIPine Besy-Benazepril HCl (10-20MG  Capsule, Oral) Active. Eszopiclone (3MG Tablet, Oral) Active. ALPRAZolam (0.5MG Tablet, Oral) Active.  Social History (  Gaye Alken. Brooks, CMA; 09/09/2019 10:53 AM) Alcohol use Moderate alcohol use. Caffeine use Carbonated beverages, Tea. Tobacco use Current some day smoker.  Family History Sharyn Lull R. Rolena Infante, CMA; 09/09/2019 10:53 AM) Arthritis Brother, Mother, Sister. Cerebrovascular Accident Brother. Diabetes Mellitus Mother, Sister. Melanoma Brother. Prostate Cancer Father.  Pregnancy / Birth History Sharyn Lull R. Rolena Infante, CMA; 09/09/2019 10:53 AM) Age at menarche 1 years. Gravida 1 Irregular periods Maternal age 27-30 Para 1  Other Problems Sharyn Lull R. Brooks, CMA; 09/09/2019 10:53 AM) Anxiety Disorder Arthritis Back Pain Bladder Problems Breast Cancer Depression Gastroesophageal Reflux Disease High blood pressure Hypercholesterolemia     Review of Systems Sharyn Lull R. Brooks CMA; 09/09/2019 10:53 AM) Cardiovascular Present- Leg Cramps. Not Present- Chest Pain, Difficulty Breathing Lying Down, Palpitations, Rapid Heart Rate, Shortness of Breath and Swelling of Extremities. Gastrointestinal Present- Excessive gas. Not Present- Abdominal Pain, Bloating, Bloody Stool, Change in Bowel Habits, Chronic diarrhea, Constipation, Difficulty Swallowing, Gets full quickly at meals, Hemorrhoids, Indigestion, Nausea, Rectal Pain and Vomiting. Female Genitourinary Present- Frequency and Urgency. Not Present- Nocturia, Painful Urination and Pelvic Pain. Neurological Present- Trouble walking and Weakness. Not Present- Decreased Memory, Fainting, Headaches, Numbness, Seizures, Tingling and Tremor. Psychiatric Present- Anxiety. Not Present- Bipolar, Change in Sleep Pattern, Depression, Fearful and Frequent crying. Endocrine Present- Heat Intolerance. Not Present- Cold Intolerance, Excessive Hunger, Hair Changes, Hot flashes and New Diabetes. Hematology Present- Easy  Bruising. Not Present- Blood Thinners, Excessive bleeding, Gland problems, HIV and Persistent Infections.  Vitals Lattie Haw [place] RMA; 09/09/2019 9:39 AM) 09/09/2019 9:38 AM Weight: 215.25 lb Height: 64in Body Surface Area: 2.02 m Body Mass Index: 36.95 kg/m  Temp.: 97.44F  Pulse: 103 (Regular)  P.OX: 97% (Room air) BP: 124/78(Sitting, Left Arm, Standard)        Physical Exam (Paulina Muchmore A. Ayvin Lipinski MD; 09/09/2019 12:53 PM)  General Mental Status-Alert. General Appearance-Consistent with stated age. Hydration-Well hydrated. Voice-Normal.  Head and Neck Head-normocephalic, atraumatic with no lesions or palpable masses. Trachea-midline. Thyroid Gland Characteristics - normal size and consistency.  Breast Note: bruising right breast noted no mass left breast normal  Cardiovascular Cardiovascular examination reveals -normal heart sounds, regular rate and rhythm with no murmurs and normal pedal pulses bilaterally.  Neurologic Neurologic evaluation reveals -alert and oriented x 3 with no impairment of recent or remote memory. Mental Status-Normal.  Lymphatic Axillary  General Axillary Region: Bilateral - Description - Normal. Tenderness - Non Tender.    Assessment & Plan (Tiegan Terpstra A. Chinita Schimpf MD; 09/09/2019 12:56 PM)  BREAST CANCER, STAGE 1, RIGHT (C50.911) Impression: discussed different surgical options of breast conservation vs mastectomy  she has opted for for right breast seed localized lumpectomy and SLN mapping refer to medical and radiation oncology  refer to PT    total time 45 minutes  Risk of lumpectomy include bleeding, infection, seroma, more surgery, use of seed/wire, wound care, cosmetic deformity and the need for other treatments, death , blood clots, death. Pt agrees to proceed. Risk of sentinel lymph node mapping include bleeding, infection, lymphedema, shoulder pain. stiffness, dye allergy. cosmetic deformity , arm  swelling blood clots, death, need for more surgery. Pt agrees to proceed.   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  You should hear from our office's scheduling department within 5 working days about the location, date, and time of surgery. We try to make accommodations for patient's preferences in scheduling surgery, but sometimes the OR schedule  or the surgeon's schedule prevents [place] from making those accommodations.  If you have not heard from our office [phone]) in 5 working days, call the office and ask for your surgeon's nurse.  If you have other questions about your diagnosis, plan, or surgery, call the office and ask for your surgeon's nurse.  Pt Education - [facility] Breast Cancer Information Given - Alight "Breast Journey" Package We discussed the staging and pathophysiology of breast cancer. We discussed all of the different options for treatment for breast cancer including surgery, chemotherapy, radiation therapy, Herceptin, and antiestrogen therapy. We discussed a sentinel lymph node biopsy as she does not appear to having lymph node involvement right now. We discussed the performance of that with injection of radioactive tracer and blue dye. We discussed that she would have an incision underneath her axillary hairline. We discussed that there is a bout a 10-20% chance of having a positive node with a sentinel lymph node biopsy and we will await the permanent pathology to make any other first further decisions in terms of her treatment. One of these options might be to return to the operating room to perform an axillary lymph node dissection. We discussed about a 1-2% risk lifetime of chronic shoulder pain as well as lymphedema associated with a sentinel lymph node biopsy. We discussed the options for treatment of the breast cancer which included lumpectomy  versus a mastectomy. We discussed the performance of the lumpectomy with a wire placement. We discussed a 10-20% chance of a positive margin requiring reexcision in the operating room. We also discussed that she may need radiation therapy or antiestrogen therapy or both if she undergoes lumpectomy. We discussed the mastectomy and the postoperative care for that as well. We discussed that there is no difference in her survival whether she undergoes lumpectomy with radiation therapy or antiestrogen therapy versus a mastectomy. There is a slight difference in the local recurrence rate being 3-5% with lumpectomy and about 1% with a mastectomy. We discussed the risks of operation including bleeding, infection, possible reoperation. She understands her further therapy will be based on what her stages at the time of her operation.  Pt Education - flb breast cancer surgery: discussed with patient and provided information. Pt Education - [facility] Breast Biopsy HCI: discussed with patient and provided information.

## 2019-09-09 NOTE — H&P (Signed)
[name] Appointment: 09/09/2019 9:20 AM Location: [facility] Patient #: [id number] DOB: 1951-02-03 Divorced / Language: [name] / Race: Black or African American Female  History of Present Illness Martha Moores A. [name] MD; 09/09/2019 12:51 PM) Patient words: Patient presents for evaluation of right breast mass detected on screening mammogram. A 9 mm mass was detected right breast upper outer quadrant on routine screening mammography. 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 PROGNOSTIC INDICATORS Results: IMMUNOHISTOCHEMICAL AND MORPHOMETRIC ANALYSIS PERFORMED MANUALLY The tumor cells are NEGATIVE for Her2 (1+). Estrogen Receptor: 70%, POSITIVE, MODERATE STAINING INTENSITY Progesterone Receptor: 0%, NEGATIVE Proliferation Marker Ki67: 85% COMMENT: The negative hormone receptor study(ies) in this case has an internal positive control. REFERENCE RANGE ESTROGEN RECEPTOR NEGATIVE 0% POSITIVE =>1% REFERENCE RANGE PROGESTERONE RECEPTOR NEGATIVE 0% POSITIVE =>1% All controls stained appropriately Martha Males MD Pathologist, Electronic Signature ( Signed 09/04/2019) FINAL DIAGNOSIS 1 of 3 FINAL for Martha, Castro ([id number]) Diagnosis 1. Breast, right, needle core biopsy, 12:30 o'clock - INVASIVE DUCTAL CARCINOMA, SEE COMMENT. 2. Lymph node, needle/core biopsy, right axilla - REACTIVE LYMPH NODE WITH DERMATOPATHIC CHANGE. Microscopic Comment 1. The carcinoma appears grade 3 and measures 7 mm in greatest linear extent. Prognostic makers will be ordered. Dr. Jeannie Castro has reviewed the case. The case was called to [facility] on 09/03/2019. Martha Males MD Pathologist, Electronic Signature (Case signed 09/03/2019)     The patient was called back from screening mammography due to a right breast mass.  EXAM: DIGITAL  DIAGNOSTIC RIGHT MAMMOGRAM WITH TOMO  ULTRASOUND RIGHT BREAST  COMPARISON: Recent screening mammography  ACR Breast Density Category a: The breast tissue is almost entirely fatty.  FINDINGS: There is an irregular mass at approximately 12 o'clock in the right breast at a mid depth.  On physical exam, no suspicious lumps are identified.  Targeted ultrasound is performed, showing an irregular mass in the right breast at 12:30, 7 cm from the nipple measuring 9 x 6 by 10 mm. There is a mildly abnormal lymph node with a cortex measuring 4.4 mm in the right axilla.  IMPRESSION: Suspicious mass in the right breast at 12:30, 7 cm from the nipple. Mildly abnormal right axillary lymph node.  RECOMMENDATION: Recommend ultrasound-guided biopsy of the 12:30 right breast mass and the mildly abnormal node in the right axilla.  I have discussed the findings and recommendations with the patient. If applicable, a reminder letter will be sent to the patient regarding the next appointment.  BI-RADS CATEGORY 4: Suspicious.   Electronically Signed By: Dorise Bullion III M.D On: 08/21/2019 14:58.  The patient is a 69 year old female.   Past Surgical History Sharyn Lull R. Brooks, CMA; 09/09/2019 10:53 AM) Breast Biopsy Right. Colon Polyp Removal - Colonoscopy Foot Surgery Bilateral. Gallbladder Surgery - Laparoscopic Hysterectomy (not due to cancer) - Partial Spinal Surgery - Lower Back  Diagnostic Studies History Sharyn Lull R. Brooks, CMA; 09/09/2019 10:53 AM) Colonoscopy 5-10 years ago Mammogram within last year Pap Smear >5 years ago  Allergies Darden Palmer, RMA; 09/09/2019 9:36 AM) No Known Drug Allergies [09/09/2019]:  Medication History Darden Palmer, RMA; 09/09/2019 9:37 AM) Sertraline HCl (100MG Tablet, Oral) Active. Rosuvastatin Calcium (10MG Tablet, Oral) Active. hydroCHLOROthiazide (25MG Tablet, Oral) Active. amLODIPine Besy-Benazepril HCl (10-20MG  Capsule, Oral) Active. Eszopiclone (3MG Tablet, Oral) Active. ALPRAZolam (0.5MG Tablet, Oral) Active.  Social History (  Gaye Alken. Brooks, CMA; 09/09/2019 10:53 AM) Alcohol use Moderate alcohol use. Caffeine use Carbonated beverages, Tea. Tobacco use Current some day smoker.  Family History Sharyn Lull R. Rolena Infante, CMA; 09/09/2019 10:53 AM) Arthritis Brother, Mother, Sister. Cerebrovascular Accident Brother. Diabetes Mellitus Mother, Sister. Melanoma Brother. Prostate Cancer Father.  Pregnancy / Birth History Sharyn Lull R. Rolena Infante, CMA; 09/09/2019 10:53 AM) Age at menarche 54 years. Gravida 1 Irregular periods Maternal age 11-30 Para 1  Other Problems Sharyn Lull R. Brooks, CMA; 09/09/2019 10:53 AM) Anxiety Disorder Arthritis Back Pain Bladder Problems Breast Cancer Depression Gastroesophageal Reflux Disease High blood pressure Hypercholesterolemia     Review of Systems Sharyn Lull R. Brooks CMA; 09/09/2019 10:53 AM) Cardiovascular Present- Leg Cramps. Not Present- Chest Pain, Difficulty Breathing Lying Down, Palpitations, Rapid Heart Rate, Shortness of Breath and Swelling of Extremities. Gastrointestinal Present- Excessive gas. Not Present- Abdominal Pain, Bloating, Bloody Stool, Change in Bowel Habits, Chronic diarrhea, Constipation, Difficulty Swallowing, Gets full quickly at meals, Hemorrhoids, Indigestion, Nausea, Rectal Pain and Vomiting. Female Genitourinary Present- Frequency and Urgency. Not Present- Nocturia, Painful Urination and Pelvic Pain. Neurological Present- Trouble walking and Weakness. Not Present- Decreased Memory, Fainting, Headaches, Numbness, Seizures, Tingling and Tremor. Psychiatric Present- Anxiety. Not Present- Bipolar, Change in Sleep Pattern, Depression, Fearful and Frequent crying. Endocrine Present- Heat Intolerance. Not Present- Cold Intolerance, Excessive Hunger, Hair Changes, Hot flashes and New Diabetes. Hematology Present- Easy  Bruising. Not Present- Blood Thinners, Excessive bleeding, Gland problems, HIV and Persistent Infections.  Vitals Lattie Haw [place] RMA; 09/09/2019 9:39 AM) 09/09/2019 9:38 AM Weight: 215.25 lb Height: 64in Body Surface Area: 2.02 m Body Mass Index: 36.95 kg/m  Temp.: 97.27F  Pulse: 103 (Regular)  P.OX: 97% (Room air) BP: 124/78(Sitting, Left Arm, Standard)        Physical Exam (Bellamie Turney A. Eliza Green MD; 09/09/2019 12:53 PM)  General Mental Status-Alert. General Appearance-Consistent with stated age. Hydration-Well hydrated. Voice-Normal.  Head and Neck Head-normocephalic, atraumatic with no lesions or palpable masses. Trachea-midline. Thyroid Gland Characteristics - normal size and consistency.  Breast Note: bruising right breast noted no mass left breast normal  Cardiovascular Cardiovascular examination reveals -normal heart sounds, regular rate and rhythm with no murmurs and normal pedal pulses bilaterally.  Neurologic Neurologic evaluation reveals -alert and oriented x 3 with no impairment of recent or remote memory. Mental Status-Normal.  Lymphatic Axillary  General Axillary Region: Bilateral - Description - Normal. Tenderness - Non Tender.    Assessment & Plan (Shriyans Kuenzi A. Lovett Coffin MD; 09/09/2019 12:56 PM)  BREAST CANCER, STAGE 1, RIGHT (C50.911) Impression: discussed different surgical options of breast conservation vs mastectomy  she has opted for for right breast seed localized lumpectomy and SLN mapping refer to medical and radiation oncology  refer to PT    total time 45 minutes  Risk of lumpectomy include bleeding, infection, seroma, more surgery, use of seed/wire, wound care, cosmetic deformity and the need for other treatments, death , blood clots, death. Pt agrees to proceed. Risk of sentinel lymph node mapping include bleeding, infection, lymphedema, shoulder pain. stiffness, dye allergy. cosmetic deformity , arm  swelling blood clots, death, need for more surgery. Pt agrees to proceed.   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  You should hear from our office's scheduling department within 5 working days about the location, date, and time of surgery. We try to make accommodations for patient's preferences in scheduling surgery, but sometimes the OR schedule  or the surgeon's schedule prevents [place] from making those accommodations.  If you have not heard from our office [phone]) in 5 working days, call the office and ask for your surgeon's nurse.  If you have other questions about your diagnosis, plan, or surgery, call the office and ask for your surgeon's nurse.  Pt Education - [facility] Breast Cancer Information Given - Alight "Breast Journey" Package We discussed the staging and pathophysiology of breast cancer. We discussed all of the different options for treatment for breast cancer including surgery, chemotherapy, radiation therapy, Herceptin, and antiestrogen therapy. We discussed a sentinel lymph node biopsy as she does not appear to having lymph node involvement right now. We discussed the performance of that with injection of radioactive tracer and blue dye. We discussed that she would have an incision underneath her axillary hairline. We discussed that there is a bout a 10-20% chance of having a positive node with a sentinel lymph node biopsy and we will await the permanent pathology to make any other first further decisions in terms of her treatment. One of these options might be to return to the operating room to perform an axillary lymph node dissection. We discussed about a 1-2% risk lifetime of chronic shoulder pain as well as lymphedema associated with a sentinel lymph node biopsy. We discussed the options for treatment of the breast cancer which included lumpectomy  versus a mastectomy. We discussed the performance of the lumpectomy with a wire placement. We discussed a 10-20% chance of a positive margin requiring reexcision in the operating room. We also discussed that she may need radiation therapy or antiestrogen therapy or both if she undergoes lumpectomy. We discussed the mastectomy and the postoperative care for that as well. We discussed that there is no difference in her survival whether she undergoes lumpectomy with radiation therapy or antiestrogen therapy versus a mastectomy. There is a slight difference in the local recurrence rate being 3-5% with lumpectomy and about 1% with a mastectomy. We discussed the risks of operation including bleeding, infection, possible reoperation. She understands her further therapy will be based on what her stages at the time of her operation.  Pt Education - flb breast cancer surgery: discussed with patient and provided information. Pt Education - [facility] Breast Biopsy HCI: discussed with patient and provided information.

## 2019-09-11 ENCOUNTER — Telehealth: Payer: Self-pay | Admitting: Hematology and Oncology

## 2019-09-11 ENCOUNTER — Other Ambulatory Visit: Payer: Self-pay | Admitting: Surgery

## 2019-09-11 DIAGNOSIS — C50911 Malignant neoplasm of unspecified site of right female breast: Secondary | ICD-10-CM

## 2019-09-11 DIAGNOSIS — Z170 Estrogen receptor positive status [ER+]: Secondary | ICD-10-CM

## 2019-09-11 NOTE — Telephone Encounter (Signed)
Received a new pt referral from Dr. Brantley Stage for new dx of breast cancer. Pt has been cld and scheduled to see Dr. Lindi Adie on 8/3 at 345pm. [name] is aware to arrive 15 minutes early.

## 2019-09-15 DIAGNOSIS — C801 Malignant (primary) neoplasm, unspecified: Secondary | ICD-10-CM

## 2019-09-15 HISTORY — DX: Malignant (primary) neoplasm, unspecified: C80.1

## 2019-09-16 NOTE — Progress Notes (Signed)
[facility] CONSULT NOTE  Patient Care Team: Everardo Beals, NP as PCP - General  CHIEF COMPLAINTS/PURPOSE OF CONSULTATION:  Newly diagnosed breast cancer  HISTORY OF PRESENTING ILLNESS:  [name] 69 y.o. female is here because of recent diagnosis of invasive ductal carcinoma of the right breast. Screening mammogram on 08/06/19 detected a right breast mass, not palpable on exam. Diagnostic mammogram on 08/21/19 showed 1.0cm mass at the 12:30 position in the right breast, with a mildly abnormal right axillary lymph node, 4.68m. 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 She presents to the clinic today for initial evaluation and discussion of treatment options.   I reviewed her records extensively and collaborated the history with the patient.  SUMMARY OF ONCOLOGIC HISTORY: Oncology History   No history exists.    MEDICAL HISTORY:  Past Medical History:  Diagnosis Date  . Anxiety   . Arthritis   . Depression   . GERD (gastroesophageal reflux disease)   . Hernia    umbilical  . Hyperlipidemia   . Hypertension     SURGICAL HISTORY: Past Surgical History:  Procedure Laterality Date  . ABDOMINAL HYSTERECTOMY     Partial  . BACK SURGERY     4 Bilateral Titanium Screws near L4 and L5  . CHOLECYSTECTOMY    . EXCISION / CURETTAGE BONE CYST PHALANGES OF FOOT Right   . HEEL SPUR EXCISION Left   . SPINAL CORD STIMULATOR INSERTION  09/26/2012   Dr BRolena Infante . SPINAL CORD STIMULATOR INSERTION N/A 09/26/2012   Procedure: SPINAL CORD STIMULATOR PLACEMENT;  Surgeon: DMelina Schools MD;  Location[facility]  Service: Orthopedics;  Laterality: N/A;  . TONSILLECTOMY      SOCIAL HISTORY: Social History   Socioeconomic History  . Marital status: Divorced    Spouse name: Not on file  . Number of children: Not on file  . Years of education: Not on  file  . Highest education level: Not on file  Occupational History  . Not on file  Tobacco Use  . Smoking status: Former Smoker    Packs/day: 0.25    Years: 30.00    Pack years: 7.50    Types: Cigarettes    Quit date: 07/26/2011    Years since quitting: 8.1  . Smokeless tobacco: Never Used  . Tobacco comment: uses e-cigarette/ occ wine,  marijuna occ  Substance and Sexual Activity  . Alcohol use: Yes    Comment: socially  . Drug use: No    Comment: former cocaine and marijuana user  . Sexual activity: Never  Other Topics Concern  . Not on file  Social History Narrative  . Not on file   Social Determinants of Health   Financial Resource Strain:   . Difficulty of Paying Living Expenses:   Food Insecurity:   . Worried About RCharity fundraiserin the Last Year:   . RArboriculturistin the Last Year:   Transportation Needs:   . LFilm/video editor(Medical):   .Marland KitchenLack of Transportation (Non-Medical):   Physical Activity:   . Days of Exercise per Week:   . Minutes of Exercise per Session:   Stress:   . Feeling of Stress :   Social Connections:   . Frequency of Communication with Friends and Family:   . Frequency of Social Gatherings with Friends and  Family:   . Attends Religious Services:   . Active Member of Clubs or Organizations:   . Attends Archivist Meetings:   Marland Kitchen Marital Status:   Intimate Partner Violence:   . Fear of Current or Ex-Partner:   . Emotionally Abused:   Marland Kitchen Physically Abused:   . Sexually Abused:     FAMILY HISTORY: Family History  Problem Relation Age of Onset  . Diabetes Mother   . Cancer Father        prostate  . Emphysema Maternal Grandfather     ALLERGIES:  has No Known Allergies.  MEDICATIONS:  Current Outpatient Medications  Medication Sig Dispense Refill  . ALPRAZolam (XANAX) 0.5 MG tablet Take 0.25-0.5 mg by mouth 2 (two) times daily as needed. For anxiety    . amLODipine-benazepril (LOTREL) 10-20 MG per capsule Take  1 capsule by mouth daily.    . Aspirin-Salicylamide-Caffeine (BC HEADACHE POWDER PO) Take 1 Package by mouth every 6 (six) hours as needed (pain).    . cefUROXime (CEFTIN) 250 MG tablet Take 1 tablet (250 mg total) by mouth 2 (two) times daily with a meal. 6 tablet 0  . cholecalciferol (VITAMIN D) 400 UNITS TABS Take 400 Units by mouth daily.    . eszopiclone (LUNESTA) 2 MG TABS Take 3 mg by mouth at bedtime as needed for sleep. Take immediately before bedtime    . hydrochlorothiazide (HYDRODIURIL) 25 MG tablet Take 25 mg by mouth daily.    . ondansetron (ZOFRAN) 4 MG tablet Take 1 tablet (4 mg total) by mouth every 6 (six) hours as needed for nausea. 20 tablet 0  . sertraline (ZOLOFT) 100 MG tablet Take 200 mg by mouth daily.     No current facility-administered medications for this visit.    REVIEW OF SYSTEMS:   Constitutional: Denies fevers, chills or abnormal night sweats Eyes: Denies blurriness of vision, double vision or watery eyes Ears, nose, mouth, throat, and face: Denies mucositis or sore throat Respiratory: Denies cough, dyspnea or wheezes Cardiovascular: Denies palpitation, chest discomfort or lower extremity swelling Gastrointestinal:  Denies nausea, heartburn or change in bowel habits Skin: Denies abnormal skin rashes Lymphatics: Denies new lymphadenopathy or easy bruising Neurological:Denies numbness, tingling or new weaknesses Behavioral/Psych: Mood is stable, no new changes  Breast: Denies any palpable lumps or discharge All other systems were reviewed with the patient and are negative.  PHYSICAL EXAMINATION: ECOG PERFORMANCE STATUS: 1 - Symptomatic but completely ambulatory  Vitals:   09/17/19 1602  BP: (!) 126/94  Pulse: (!) 102  Resp: 18  Temp: 98.5 F (36.9 C)  SpO2: 99%   Filed Weights   09/17/19 1602  Weight: 215 lb 9.6 oz (97.8 kg)    GENERAL:alert, no distress and comfortable SKIN: skin color, texture, turgor are normal, no rashes or significant  lesions EYES: normal, conjunctiva are pink and non-injected, sclera clear OROPHARYNX:no exudate, no erythema and lips, buccal mucosa, and tongue normal  NECK: supple, thyroid normal size, non-tender, without nodularity LYMPH:  no palpable lymphadenopathy in the cervical, axillary or inguinal LUNGS: clear to auscultation and percussion with normal breathing effort HEART: regular rate & rhythm and no murmurs and no lower extremity edema ABDOMEN:abdomen soft, non-tender and normal bowel sounds Musculoskeletal:no cyanosis of digits and no clubbing  PSYCH: alert & oriented x 3 with fluent speech NEURO: no focal motor/sensory deficits BREAST: No palpable nodules in breast. No palpable axillary or supraclavicular lymphadenopathy (exam performed in the presence of a chaperone)   LABORATORY  DATA:  I have reviewed the data as listed Lab Results  Component Value Date   WBC 10.9 (H) 08/04/2014   HGB 11.0 (L) 08/04/2014   HCT 34.0 (L) 08/04/2014   MCV 91.9 08/04/2014   PLT 335 08/04/2014   Lab Results  Component Value Date   NA 138 08/05/2014   K 3.7 08/05/2014   CL 106 08/05/2014   CO2 20 (L) 08/05/2014    RADIOGRAPHIC STUDIES: I have personally reviewed the radiological reports and agreed with the findings in the report.  ASSESSMENT AND PLAN:  Malignant neoplasm of upper-outer quadrant of right breast in female, estrogen receptor positive ([place]) Screening mammogram on 08/06/19 detected a right breast mass, not palpable on exam. Diagnostic mammogram on 08/21/19 showed 1.0cm mass at the 12:30 position in the right breast, with a mildly abnormal right axillary lymph node, 4.55m. 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  Pathology and radiology counseling:Discussed with the patient, the details of pathology including the type of breast cancer,the clinical staging, the  significance of ER, PR and HER-2/neu receptors and the implications for treatment. After reviewing the pathology in detail, we proceeded to discuss the different treatment options between surgery, radiation, chemotherapy, antiestrogen therapies.  Recommendations: 1. Breast conserving surgery with sentinel lymph node biopsy followed by 2. Oncotype DX testing to determine if chemotherapy would be of any benefit followed by 3. Adjuvant radiation therapy followed by 4. Adjuvant antiestrogen therapy  Oncotype counseling: I discussed Oncotype DX test. I explained to the patient that this is a 21 gene panel to evaluate patient tumors DNA to calculate recurrence score. This would help determine whether patient has high risk or intermediate risk or low risk breast cancer. She understands that if her tumor was found to be high risk, she would benefit from systemic chemotherapy. If low risk, no need of chemotherapy. If she was found to be intermediate risk, we would need to evaluate the score as well as other risk factors and determine if an abbreviated chemotherapy may be of benefit.  Back pain issues: I suspect this is related to arthritis.  In order to evaluate this further we will obtain CT chest abdomen pelvis with contrast.  Return to clinic after surgery to discuss final pathology report and then determine if Oncotype DX testing will need to be sent. She is scheduled for surgery on 17 August. Patient is here accompanied by her daughter who works in [place] is able to come here to assist her mother.   All questions were answered. The patient knows to call the clinic with any problems, questions or concerns.   VRulon Eisenmenger MD, MPH 09/17/2019    I, Molly Dorshimer, am acting as scribe for VNicholas Lose MD.  I have reviewed the above documentation for accuracy and completeness, and I agree with the above.

## 2019-09-17 ENCOUNTER — Other Ambulatory Visit: Payer: Self-pay

## 2019-09-17 ENCOUNTER — Inpatient Hospital Stay: Payer: Medicare PPO | Attending: Hematology and Oncology | Admitting: Hematology and Oncology

## 2019-09-17 DIAGNOSIS — C50411 Malignant neoplasm of upper-outer quadrant of right female breast: Secondary | ICD-10-CM | POA: Diagnosis present

## 2019-09-17 DIAGNOSIS — Z170 Estrogen receptor positive status [ER+]: Secondary | ICD-10-CM | POA: Insufficient documentation

## 2019-09-17 NOTE — Assessment & Plan Note (Signed)
Screening mammogram on 08/06/19 detected a right breast mass, not palpable on exam. Diagnostic mammogram on 08/21/19 showed 1.0cm mass at the 12:30 position in the right breast, with a mildly abnormal right axillary lymph node, 4.100m. 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  Pathology and radiology counseling:Discussed with the patient, the details of pathology including the type of breast cancer,the clinical staging, the significance of ER, PR and HER-2/neu receptors and the implications for treatment. After reviewing the pathology in detail, we proceeded to discuss the different treatment options between surgery, radiation, chemotherapy, antiestrogen therapies.  Recommendations: 1. Breast conserving surgery followed by 2. Oncotype DX testing to determine if chemotherapy would be of any benefit followed by 3. Adjuvant radiation therapy followed by 4. Adjuvant antiestrogen therapy  Oncotype counseling: I discussed Oncotype DX test. I explained to the patient that this is a 21 gene panel to evaluate patient tumors DNA to calculate recurrence score. This would help determine whether patient has high risk or intermediate risk or low risk breast cancer. She understands that if her tumor was found to be high risk, she would benefit from systemic chemotherapy. If low risk, no need of chemotherapy. If she was found to be intermediate risk, we would need to evaluate the score as well as other risk factors and determine if an abbreviated chemotherapy may be of benefit.  Return to clinic after surgery to discuss final pathology report and then determine if Oncotype DX testing will need to be sent.

## 2019-09-18 ENCOUNTER — Telehealth: Payer: Self-pay | Admitting: *Deleted

## 2019-09-18 ENCOUNTER — Encounter: Payer: Self-pay | Admitting: *Deleted

## 2019-09-18 DIAGNOSIS — C50411 Malignant neoplasm of upper-outer quadrant of right female breast: Secondary | ICD-10-CM

## 2019-09-18 DIAGNOSIS — Z170 Estrogen receptor positive status [ER+]: Secondary | ICD-10-CM

## 2019-09-18 NOTE — Telephone Encounter (Signed)
Spoke to pt daughter regarding new pt appt with Dr. Lindi Adie. Denies questions or concerns regarding dx or treatment care plan. Gave number to call central scheduling for CT cap per request. Referral placed for PT evaluation prior to surgery. Gave navigation resources and contact information for questions or needs.

## 2019-09-19 ENCOUNTER — Encounter: Payer: Self-pay | Admitting: Rehabilitation

## 2019-09-19 ENCOUNTER — Ambulatory Visit: Payer: Medicare PPO | Admitting: Radiation Oncology

## 2019-09-19 ENCOUNTER — Encounter: Payer: Self-pay | Admitting: Licensed Clinical Social Worker

## 2019-09-19 ENCOUNTER — Telehealth: Payer: Self-pay | Admitting: Hematology and Oncology

## 2019-09-19 ENCOUNTER — Ambulatory Visit: Payer: Medicare PPO

## 2019-09-19 ENCOUNTER — Other Ambulatory Visit: Payer: Self-pay

## 2019-09-19 ENCOUNTER — Ambulatory Visit: Payer: Medicare PPO | Attending: Hematology and Oncology | Admitting: Rehabilitation

## 2019-09-19 ENCOUNTER — Encounter: Payer: Self-pay | Admitting: *Deleted

## 2019-09-19 DIAGNOSIS — R293 Abnormal posture: Secondary | ICD-10-CM | POA: Diagnosis present

## 2019-09-19 DIAGNOSIS — C50411 Malignant neoplasm of upper-outer quadrant of right female breast: Secondary | ICD-10-CM | POA: Insufficient documentation

## 2019-09-19 DIAGNOSIS — Z170 Estrogen receptor positive status [ER+]: Secondary | ICD-10-CM | POA: Diagnosis present

## 2019-09-19 NOTE — Telephone Encounter (Signed)
Scheduled per 8/3 los, called and spoke with pt, confirmed 8/18 and 8/24 appts

## 2019-09-19 NOTE — Therapy (Addendum)
[place], [place], [address] Phone: [phone]   Fax:  [phone]  Physical Therapy Evaluation  Patient Details  Name: [name] MRN: [id number] Date of Birth: December 09, 1950 Referring Provider (PT): Dr. Lindi Adie   Encounter Date: 09/19/2019   PT End of Session - 09/19/19 0948    Visit Number 1    Number of Visits 2    Date for PT Re-Evaluation 10/29/19    Authorization Type [employer]    PT Start Time 0915    PT Stop Time 0946    PT Time Calculation (min) 31 min    Activity Tolerance Patient tolerated treatment well    Behavior During Therapy [facility] for tasks assessed/performed           Past Medical History:  Diagnosis Date  . Anxiety   . Arthritis   . Depression   . GERD (gastroesophageal reflux disease)   . Hernia    umbilical  . Hyperlipidemia   . Hypertension     Past Surgical History:  Procedure Laterality Date  . ABDOMINAL HYSTERECTOMY     Partial  . BACK SURGERY     4 Bilateral Titanium Screws near L4 and L5  . CHOLECYSTECTOMY    . EXCISION / CURETTAGE BONE CYST PHALANGES OF FOOT Right   . HEEL SPUR EXCISION Left   . SPINAL CORD STIMULATOR INSERTION  09/26/2012   Dr Rolena Infante  . SPINAL CORD STIMULATOR INSERTION N/A 09/26/2012   Procedure: SPINAL CORD STIMULATOR PLACEMENT;  Surgeon: Melina Schools, MD;  Location: [facility];  Service: Orthopedics;  Laterality: N/A;  . TONSILLECTOMY      There were no vitals filed for this visit.    Subjective Assessment - 09/19/19 0916    Subjective I just have back trouble    Pertinent History diagnosed with Rt ER positive, PR/HER2 negative IDC with plan for lumpectomy and SLNB scheduled for 10/01/19.  other health history of HTN, anxiety, hysterectomy, spinal stimulator that was removed and pt reports only the leads are in place now    Limitations Walking   always use the walker   Patient Stated Goals get information for after surgery     Currently in Pain? Yes    Pain Score 9     Pain Location Back    Pain Orientation Lower    Pain Descriptors / Indicators Aching;Burning    Pain Type Chronic pain    Pain Onset More than a month ago    Pain Frequency Constant    Aggravating Factors  walking, movement    Pain Relieving Factors nothing              [facility] PT Assessment - 09/19/19 0001      Assessment   Medical Diagnosis Rt breast cancer    Referring Provider (PT) Dr. Lindi Adie    Onset Date/Surgical Date 09/19/19    Hand Dominance Right    Prior Therapy no      Precautions   Precaution Comments active cancer      Restrictions   Weight Bearing Restrictions No      Balance Screen   Has the patient fallen in the past 6 months No    Has the patient had a decrease in activity level because of a fear of falling?  No    Is the patient reluctant to leave their home because of a fear of falling?  No      Home Environment  Living Environment Private residence    Living Arrangements Children    Available Help at Discharge Family    Type of [place] One level      Prior Function   Level of [place] Retired;On disability    Leisure I would like to try to walk      Cognition   Overall Cognitive Status Within Functional Limits for tasks assessed      Observation/Other Assessments   Observations walks in in lumbar flexion with RW      Posture/Postural Control   Posture/Postural Control Postural limitations    Postural Limitations Rounded Shoulders;Forward head;Increased lumbar lordosis;Increased thoracic kyphosis      ROM / Strength   AROM / PROM / Strength AROM      AROM   Overall AROM Comments cervical WNL no pain    AROM Assessment Site Shoulder;Cervical    Right/Left Shoulder Right;Left    Right Shoulder Extension 80 Degrees    Right Shoulder Flexion 155 Degrees    Right Shoulder ABduction 162 Degrees    Right Shoulder Internal Rotation 80 Degrees     Right Shoulder External Rotation 103 Degrees      Ambulation/Gait   Assistive device Rolling walker    Gait Comments limited mobility due to back pain             LYMPHEDEMA/ONCOLOGY QUESTIONNAIRE - 09/19/19 0001      Type   Cancer Type Rt breast cancer      Lymphedema Assessments   Lymphedema Assessments Upper extremities      Right Upper Extremity Lymphedema   15 cm Proximal to Olecranon Process 37.8 cm    10 cm Proximal to Olecranon Process 33.7 cm    Olecranon Process 27.5 cm    15 cm Proximal to Ulnar Styloid Process 26.3 cm    10 cm Proximal to Ulnar Styloid Process 23.2 cm    Just Proximal to Ulnar Styloid Process 16.5 cm    Across Hand at [employer] 19.8 cm    At [place] of 2nd Digit 6.7 cm    Other 7      Left Upper Extremity Lymphedema   15 cm Proximal to Olecranon Process 36.2 cm    10 cm Proximal to Olecranon Process 34 cm    Olecranon Process 26.5 cm    15 cm Proximal to Ulnar Styloid Process 25 cm    10 cm Proximal to Ulnar Styloid Process 22.5 cm    Just Proximal to Ulnar Styloid Process 17 cm    Across Hand at [employer] 20.4 cm    At [place] of 2nd Digit 6.5 cm    Other 7                   Objective measurements completed on examination: See above findings.               PT Education - 09/19/19 [phone]    Education Details briefly lymphedema and surveillance, post op care and stretches    Person(s) Educated Patient    Methods Explanation    Comprehension Verbalized understanding                Breast Clinic Goals - 09/19/19 0953      Patient will be able to verbalize understanding of pertinent lymphedema risk reduction practices relevant to her diagnosis specifically related to skin care.   Status Achieved  Patient will be able to return demonstrate and/or verbalize understanding of the post-op home exercise program related to regaining shoulder range of motion.   Status Achieved      Patient will be able to  verbalize understanding of the importance of attending the postoperative After Breast Cancer Class for further lymphedema risk reduction education and therapeutic exercise.   Status Achieved                 Plan - 09/19/19 0949    Clinical Impression Statement Pt presents pre Rt lumpectomy and SLNB due to Rt breast cancer for baseline assessment and post op education.  Pt is limited in mobility due to chronic back pain requiring walker use.  SOZO was not completed today due to indwelling spinal stimulator implant but circumferential measurements were taken for fluid analysis.  Pt will return in 3-4 weeks after surgery for reassessment    Personal Factors and Comorbidities Age;Fitness    Examination-Activity Limitations Stand;Locomotion Level;Bend;Carry    Examination-Participation Restrictions Cleaning;Yard Work;Community Activity;Occupation;Laundry;Shop    Stability/Clinical Decision Making Stable/Uncomplicated    Clinical Decision Making Low    Rehab Potential Excellent    PT Frequency --   post op assessment with future visits as needed   PT Duration 6 weeks    PT Treatment/Interventions ADLs/Self Care Home Management;Patient/family education;Manual lymph drainage;Manual techniques;Therapeutic activities;Therapeutic exercise;Passive range of motion;Taping    PT Next Visit Plan post op check; will do ABC class or need in clinic packet? , more visits?    PT Home Exercise Plan post op    Consulted and Agree with Plan of Care Patient           Patient will benefit from skilled therapeutic intervention in order to improve the following deficits and impairments:  Postural dysfunction  Visit Diagnosis: Abnormal posture  Malignant neoplasm of upper-outer quadrant of right breast in female, estrogen receptor positive ([place])     Problem List Patient Active Problem List   Diagnosis Date Noted  . Malignant neoplasm of upper-outer quadrant of right breast in female, estrogen receptor  positive ([place]) 09/17/2019  . AKI (acute kidney injury) ([place]) 08/03/2014  . Nausea with vomiting 08/03/2014  . Dehydration 08/03/2014  . Bilateral flank pain 05/09/2012  . HTN (hypertension) 05/09/2012  . GERD (gastroesophageal reflux disease) 05/09/2012  . Adjustment disorder with mixed anxiety and depressed mood 05/09/2012    Shan Levans, PT 09/19/2019, 9:54 AM  [place], [place], [address] Phone: [phone]   Fax:  [phone]  Name: [name] MRN: [id number] Date of Birth: Jul 08, 1950  PHYSICAL THERAPY DISCHARGE SUMMARY  Visits from Start of Care: 2  Current functional level related to goals / functional outcomes: All met   Remaining deficits: none   Education / Equipment: See above Plan: Patient agrees to discharge.  Patient goals were met. Patient is being discharged due to meeting the stated rehab goals.  ?????   Shan Levans, PT

## 2019-09-19 NOTE — Progress Notes (Signed)
[place] Clinical Social Work  [place] representative contacted patient by phone  to offer support and assess for needs for new patient. Patient reports doing well at this time. She has dealt with back pain for 20 years, so feels she can handle anything that comes with this new diagnosis. She also has her daughter available for support.  CSW informed patient of the support team and support services at [facility].  CSW provided contact information and encouraged patient to call with any questions or concerns.        [name], [place], [place] Worker [facility]

## 2019-09-19 NOTE — Patient Instructions (Signed)

## 2019-09-20 ENCOUNTER — Telehealth: Payer: Self-pay | Admitting: Hematology and Oncology

## 2019-09-20 NOTE — Telephone Encounter (Signed)
Scheduled appts pt 8/5 staff msg. Pt's daughter confirmed appt date and time.

## 2019-09-23 ENCOUNTER — Encounter (HOSPITAL_BASED_OUTPATIENT_CLINIC_OR_DEPARTMENT_OTHER): Payer: Self-pay | Admitting: Surgery

## 2019-09-23 ENCOUNTER — Telehealth: Payer: Self-pay | Admitting: Hematology and Oncology

## 2019-09-23 ENCOUNTER — Other Ambulatory Visit: Payer: Self-pay

## 2019-09-23 NOTE — Progress Notes (Signed)
Location of Breast Cancer: Malignant neoplasm of Right Breast  Did patient present with symptoms (if so, please note symptoms) or was this found on screening mammography?: Routine mammogram  Diagnostic Mammogram/US 08/21/2019: Suspicious mass in the right breast at 12:30, 7 cm from the nipple measuring 9 x 6 x 10 mm.  Mildly abnormal right axillary lymph node with a cortex measuring 4.4 mm.  Screening Mammogram 08/06/2019: Possible mass in the right breast.  Histology per Pathology Report:   Receptor Status: ER(70% +), PR (0% -), Her2-neu (-), Ki-67(85%)     Past/Anticipated interventions by surgeon, if any: Dr. Brantley Stage 09/09/2019 -She has opted for right breast seed localized lumpectomy and SLN mapping.  Refer to medical and radiation oncology. -Refer to PT. -Right breast lumpectomy with radioactive seed and SLN biopsy 10/01/2019  Past/Anticipated interventions by medical oncology, if any: Chemotherapy  Dr. Lindi Adie 10/02/2019, 10/08/2019   Lymphedema issues, if any: No swelling at the site just some itching.  Pain issues, if any:  No  SAFETY ISSUES:  Prior radiation? No  Pacemaker/ICD? No  Possible current pregnancy? Hysterectomy  Is the patient on methotrexate? No  Current Complaints / other details:      Cori Razor, RN 09/23/2019,11:47 AM

## 2019-09-23 NOTE — Telephone Encounter (Signed)
Scheduled per 8/5 staff msg, called and confirmed 8/13 appt

## 2019-09-24 ENCOUNTER — Ambulatory Visit
Admission: RE | Admit: 2019-09-24 | Discharge: 2019-09-24 | Disposition: A | Discharge status: Home / Self Care | Payer: Medicare PPO | Source: Ambulatory Visit | Attending: Radiation Oncology | Admitting: Radiation Oncology

## 2019-09-24 ENCOUNTER — Other Ambulatory Visit: Payer: Self-pay

## 2019-09-24 ENCOUNTER — Encounter: Payer: Self-pay | Admitting: *Deleted

## 2019-09-24 ENCOUNTER — Encounter: Payer: Self-pay | Admitting: Radiation Oncology

## 2019-09-24 ENCOUNTER — Encounter: Payer: Self-pay | Admitting: Licensed Clinical Social Worker

## 2019-09-24 VITALS — Ht 63.0 in | Wt 215.0 lb

## 2019-09-24 DIAGNOSIS — Z170 Estrogen receptor positive status [ER+]: Secondary | ICD-10-CM

## 2019-09-24 DIAGNOSIS — C50411 Malignant neoplasm of upper-outer quadrant of right female breast: Secondary | ICD-10-CM

## 2019-09-24 NOTE — Progress Notes (Signed)
[place] Psychosocial Distress Screening Clinical Social Work  Clinical Social Work was referred by distress screening protocol.  The patient scored a 10 on the Psychosocial Distress Thermometer which indicates severe distress. Clinical Social Worker contacted patient by phone to assess for distress and other psychosocial needs.   CSW had spoken with patient last week and she reported that this diagnosis is "a piece of cake" compared to what she's dealt with with her back for 20 years. Feels the same today and said "I'm leaning towards everything is going to be fine". No needs at this time.  ONCBCN DISTRESS SCREENING 09/24/2019  Screening Type Initial Screening  Distress experienced in past week (1-10) 10    Clinical Social Worker follow up needed: No.  If yes, follow up plan:  Armandina Iman, [place], LCSW

## 2019-09-24 NOTE — Progress Notes (Signed)
Radiation Oncology         [phone] ________________________________  Name: [name]        MRN: [id number]  Date of Service: 09/24/2019 DOB: 12/19/1950  WU:[id number], Joelene Millin, NP  Erroll Luna, MD     REFERRING PHYSICIAN: Erroll Luna, MD   DIAGNOSIS: The encounter diagnosis was Malignant neoplasm of upper-outer quadrant of right breast in female, estrogen receptor positive ([place]).   HISTORY OF PRESENT ILLNESS: [name] is a 69 y.o. female with  a new diagnosis of right breast cancer. The patient was noted to have screening detected mass in the right breast. This was not palpable on examination, but diagnostic imaging revealed a 1 cm mass at the 12:30 position with a mildly abnormal appearing right axillary node measuring 4.4 mm. The location of the mass was at 1230 o'clock. On 09/02/2019 she underwent a biopsy of this area as well as the lymph node. 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 She has met with Dr. Brantley Stage and Dr. Lindi Adie and plans to undergo lumpectomy with sentinel lymph node biopsy on 10/01/19. Dr. Lindi Adie has recommended Oncotype scoring to determine a role for systemic therapy, and she is contacted today to discuss options of adjuvant radiotherapy.     PREVIOUS RADIATION THERAPY: No   PAST MEDICAL HISTORY:  Past Medical History:  Diagnosis Date  . Anxiety   . Arthritis   . Depression   . GERD (gastroesophageal reflux disease)   . Hernia    umbilical  . Hyperlipidemia   . Hypertension   . Sleep apnea        PAST SURGICAL HISTORY: Past Surgical History:  Procedure Laterality Date  . ABDOMINAL HYSTERECTOMY     Partial  . BACK SURGERY     4 Bilateral Titanium Screws near L4 and L5  . CHOLECYSTECTOMY    . EXCISION / CURETTAGE BONE CYST PHALANGES OF FOOT Right   . HEEL SPUR EXCISION Left   .  SPINAL CORD STIMULATOR INSERTION  09/26/2012   Dr Rolena Infante  . SPINAL CORD STIMULATOR INSERTION N/A 09/26/2012   Procedure: SPINAL CORD STIMULATOR PLACEMENT;  Surgeon: Melina Schools, MD;  Location: [facility];  Service: Orthopedics;  Laterality: N/A;  . TONSILLECTOMY       FAMILY HISTORY:  Family History  Problem Relation Age of Onset  . Diabetes Mother   . Cancer Father        prostate  . Emphysema Maternal Grandfather      SOCIAL HISTORY:  reports that she quit smoking about 8 years ago. Her smoking use included cigarettes. She has a 7.50 pack-year smoking history. She has never used smokeless tobacco. She reports current alcohol use. She reports that she does not use drugs. The patient is divorced and lives in [place].  ALLERGIES: Ceftin [cefuroxime] and Zofran [ondansetron]   MEDICATIONS:  Current Outpatient Medications  Medication Sig Dispense Refill  . ALPRAZolam (XANAX) 0.5 MG tablet Take 0.25-0.5 mg by mouth 2 (two) times daily as needed. For anxiety    . amLODipine-benazepril (LOTREL) 10-20 MG per capsule Take 1 capsule by mouth daily.    . cholecalciferol (VITAMIN D) 400 UNITS TABS Take 400 Units by mouth daily.    . eszopiclone (LUNESTA) 2 MG TABS Take 3 mg by mouth at bedtime as needed for sleep. Take immediately before bedtime    . hydrochlorothiazide (  HYDRODIURIL) 25 MG tablet Take 25 mg by mouth daily.    . Omeprazole (PRILOSEC PO) Take by mouth.    . rosuvastatin (CRESTOR) 10 MG tablet     . sertraline (ZOLOFT) 100 MG tablet Take 200 mg by mouth daily.     No current facility-administered medications for this encounter.     REVIEW OF SYSTEMS: On review of systems, the patient reports that she is doing well overall. She does have concerns about getting around to the doctor's appointments given that she doesn't get out and about much. She has had significant trouble with her back in the last twenty years that has kept her from being active. She did have a spinal cord  stimulator placed several years ago, though the battery pack has been removed, she still has in situ leads.    PHYSICAL EXAM:  Wt Readings from Last 3 Encounters:  09/24/19 215 lb (97.5 kg)  09/17/19 215 lb 9.6 oz (97.8 kg)  08/03/14 211 lb (95.7 kg)   Unable to assess given encounter type   ECOG = 0  0 - Asymptomatic (Fully active, able to carry on all predisease activities without restriction)  1 - Symptomatic but completely ambulatory (Restricted in physically strenuous activity but ambulatory and able to carry out work of a light or sedentary nature. For example, light housework, office work)  2 - Symptomatic, <50% in bed during the day (Ambulatory and capable of all self care but unable to carry out any work activities. Up and about more than 50% of waking hours)  3 - Symptomatic, >50% in bed, but not bedbound (Capable of only limited self-care, confined to bed or chair 50% or more of waking hours)  4 - Bedbound (Completely disabled. Cannot carry on any self-care. Totally confined to bed or chair)  5 - Death   [name] MM, [name], [name], et al. [phone]). "Toxicity and response criteria of the [facility]". Natalia Oncol. 5 (6): 649-55    LABORATORY DATA:  Lab Results  Component Value Date   WBC 10.9 (H) 08/04/2014   HGB 11.0 (L) 08/04/2014   HCT 34.0 (L) 08/04/2014   MCV 91.9 08/04/2014   PLT 335 08/04/2014   Lab Results  Component Value Date   NA 138 08/05/2014   K 3.7 08/05/2014   CL 106 08/05/2014   CO2 20 (L) 08/05/2014   Lab Results  Component Value Date   ALT 22 08/03/2014   AST 25 08/03/2014   ALKPHOS 121 08/03/2014   BILITOT 0.7 08/03/2014      RADIOGRAPHY: [place] AXILLARY NODE CORE BIOPSY RIGHT  Addendum Date: 09/03/2019   ADDENDUM REPORT: 09/03/2019 14:57 ADDENDUM: Pathology revealed GRADE III INVASIVE DUCTAL CARCINOMA of the RIGHT breast, 12:30 o'clock. This was found to be concordant by Dr. Lovey Newcomer. Pathology  revealed REACTIVE LYMPH NODE WITH DERMATOPATHIC CHANGE of the RIGHT axilla. This was found to be concordant by Dr. Lovey Newcomer. Pathology results were discussed with the patient by telephone. The patient reported doing well after the biopsies with tenderness at the sites. Post biopsy instructions and care were reviewed and questions were answered. The patient was encouraged to call [facility] for any additional concerns. Surgical consultation has been arranged with Dr. Erroll Luna at [facility] on September 09, 2019. Pathology results reported by [name] on 09/03/2019. Electronically Signed   By: Martha Newcomer M.D.   On: 09/03/2019 14:57   Result  Date: 09/03/2019 CLINICAL DATA:  Patient with suspicious right breast mass and right axillary lymph node. EXAM: ULTRASOUND GUIDED RIGHT BREAST CORE NEEDLE BIOPSY COMPARISON:  Previous exam(s). PROCEDURE: I met with the patient and we discussed the procedure of ultrasound-guided biopsy, including benefits and alternatives. We discussed the high likelihood of a successful procedure. We discussed the risks of the procedure, including infection, bleeding, tissue injury, clip migration, and inadequate sampling. Informed written consent was given. The usual time-out protocol was performed immediately prior to the procedure. Site 1: Right breast mass 12:30 o'clock Lesion quadrant: Upper inner quadrant Using sterile technique and 1% Lidocaine as local anesthetic, under direct ultrasound visualization, a 14 gauge spring-loaded device was used to perform biopsy of right breast mass 12:30 o'clock using a lateral approach. At the conclusion of the procedure ribbon shaped tissue marker clip was deployed into the biopsy cavity. Follow up 2 view mammogram was performed and dictated separately. Site 2: Right axillary node. Lesion quadrant: Upper outer quadrant Using sterile technique and 1% Lidocaine as local anesthetic, under direct ultrasound  visualization, a 14 gauge spring-loaded device was used to perform biopsy of right axillary node using a lateral approach. At the conclusion of the procedure [facility] tissue marker clip was deployed into the biopsy cavity. Follow up 2 view mammogram was performed and dictated separately. IMPRESSION: Ultrasound guided biopsy of right breast mass and right axillary node. No apparent complications. Electronically Signed: By: Martha Newcomer M.D. On: 09/02/2019 14:47   MM CLIP PLACEMENT RIGHT  Result Date: 09/02/2019 CLINICAL DATA:  Patient status post ultrasound-guided biopsy right breast mass and right axillary lymph node EXAM: DIAGNOSTIC RIGHT MAMMOGRAM POST ULTRASOUND BIOPSY COMPARISON:  Previous exam(s). FINDINGS: Site 1: Right breast mass 12:30 o'clock: Ribbon shaped clip: In appropriate position Site 2: Right axillary lymph node: HydroMARK clip: In appropriate position. IMPRESSION: Appropriate position biopsy marking clips as above. Final Assessment: Post Procedure Mammograms for Marker Placement Electronically Signed   By: Martha Newcomer M.D.   On: 09/02/2019 14:49   [place] RT BREAST BX W LOC DEV 1ST LESION IMG BX SPEC US GUIDE  Addendum Date: 09/03/2019   ADDENDUM REPORT: 09/03/2019 14:57 ADDENDUM: Pathology revealed GRADE III INVASIVE DUCTAL CARCINOMA of the RIGHT breast, 12:30 o'clock. This was found to be concordant by Dr. Lovey Newcomer. Pathology revealed REACTIVE LYMPH NODE WITH DERMATOPATHIC CHANGE of the RIGHT axilla. This was found to be concordant by Dr. Lovey Newcomer. Pathology results were discussed with the patient by telephone. The patient reported doing well after the biopsies with tenderness at the sites. Post biopsy instructions and care were reviewed and questions were answered. The patient was encouraged to call [facility] for any additional concerns. Surgical consultation has been arranged with Dr. Erroll Luna at [facility] on September 09, 2019. Pathology  results reported by [name] on 09/03/2019. Electronically Signed   By: Martha Newcomer M.D.   On: 09/03/2019 14:57   Result Date: 09/03/2019 CLINICAL DATA:  Patient with suspicious right breast mass and right axillary lymph node. EXAM: ULTRASOUND GUIDED RIGHT BREAST CORE NEEDLE BIOPSY COMPARISON:  Previous exam(s). PROCEDURE: I met with the patient and we discussed the procedure of ultrasound-guided biopsy, including benefits and alternatives. We discussed the high likelihood of a successful procedure. We discussed the risks of the procedure, including infection, bleeding, tissue injury, clip migration, and inadequate sampling. Informed written consent was given. The usual time-out protocol was performed immediately prior to the procedure. Site 1:  Right breast mass 12:30 o'clock Lesion quadrant: Upper inner quadrant Using sterile technique and 1% Lidocaine as local anesthetic, under direct ultrasound visualization, a 14 gauge spring-loaded device was used to perform biopsy of right breast mass 12:30 o'clock using a lateral approach. At the conclusion of the procedure ribbon shaped tissue marker clip was deployed into the biopsy cavity. Follow up 2 view mammogram was performed and dictated separately. Site 2: Right axillary node. Lesion quadrant: Upper outer quadrant Using sterile technique and 1% Lidocaine as local anesthetic, under direct ultrasound visualization, a 14 gauge spring-loaded device was used to perform biopsy of right axillary node using a lateral approach. At the conclusion of the procedure [facility] tissue marker clip was deployed into the biopsy cavity. Follow up 2 view mammogram was performed and dictated separately. IMPRESSION: Ultrasound guided biopsy of right breast mass and right axillary node. No apparent complications. Electronically Signed: By: Martha Newcomer M.D. On: 09/02/2019 14:47       IMPRESSION/PLAN: 1. Stage IB, [id number], grade 3, ER positive invasive ductal carcinoma of the  right breast. [name] discusses the pathology findings and reviews the nature of right breast disease. The consensus from the breast conference includes breast conservation with lumpectomy with sentinel node biopsy. Dr. Lindi Adie has recommended Oncotype Dx score to determine a role for systemic therapy. Provided that chemotherapy is not indicated, the patient's course would then be followed by external radiotherapy to the breast followed by antiestrogen therapy. We discussed the risks, benefits, short, and long term effects of radiotherapy, and the patient is interested in proceeding. [name] discusses the delivery and logistics of radiotherapy and anticipates a course of 4 - 6 1/2 weeks of radiotherapy. We will see her back about 2 weeks after surgery to discuss the simulation process and anticipate we starting radiotherapy about 4-6 weeks after surgery.  2. In situ spinal cord stimulator. The leads are still present but the battery pack has been removed. [name] does not anticipate any issues with proceeding as this is not a complete or functional device.   Given current concerns for patient exposure during the COVID-19 pandemic, this encounter was conducted via telephone.  The patient has provided two factor identification and has given verbal consent for this type of encounter and has been advised to only accept a meeting of this type in a secure network environment. The time spent during this encounter was 60 minutes including preparation, discussion, and coordination of the patient's care. The attendants for this meeting include Martha Nicely, Castro, [name], [name]  and [name].  During the encounter,  Martha Nicely, Castro, [name], and [name] were located at [facility] Radiation Oncology Department.  RAYNETTE ARRAS was located at home.    The above documentation reflects my direct findings during this shared patient visit. Please see the  separate note by [name] on this date for the remainder of the patient's plan of care.    [name], [name]

## 2019-09-26 ENCOUNTER — Other Ambulatory Visit: Payer: Self-pay | Admitting: *Deleted

## 2019-09-26 DIAGNOSIS — C50411 Malignant neoplasm of upper-outer quadrant of right female breast: Secondary | ICD-10-CM

## 2019-09-26 DIAGNOSIS — Z170 Estrogen receptor positive status [ER+]: Secondary | ICD-10-CM

## 2019-09-27 ENCOUNTER — Inpatient Hospital Stay: Payer: Medicare PPO

## 2019-09-27 ENCOUNTER — Other Ambulatory Visit (HOSPITAL_COMMUNITY)
Admission: RE | Admit: 2019-09-27 | Discharge: 2019-09-27 | Disposition: A | Discharge status: Home / Self Care | Payer: Medicare PPO | Source: Ambulatory Visit | Attending: Surgery | Admitting: Surgery

## 2019-09-27 ENCOUNTER — Encounter (HOSPITAL_BASED_OUTPATIENT_CLINIC_OR_DEPARTMENT_OTHER)
Admission: RE | Admit: 2019-09-27 | Discharge: 2019-09-27 | Disposition: A | Discharge status: Home / Self Care | Payer: Medicare PPO | Source: Ambulatory Visit | Attending: Surgery | Admitting: Surgery

## 2019-09-27 DIAGNOSIS — Z01810 Encounter for preprocedural cardiovascular examination: Secondary | ICD-10-CM | POA: Insufficient documentation

## 2019-09-27 DIAGNOSIS — Z01812 Encounter for preprocedural laboratory examination: Secondary | ICD-10-CM | POA: Insufficient documentation

## 2019-09-27 DIAGNOSIS — Z20822 Contact with and (suspected) exposure to covid-19: Secondary | ICD-10-CM | POA: Insufficient documentation

## 2019-09-27 LAB — COMPREHENSIVE METABOLIC PANEL
ALT: 32 U/L (ref 0–44)
AST: 43 U/L — ABNORMAL HIGH (ref 15–41)
Albumin: 3.6 g/dL (ref 3.5–5.0)
Alkaline Phosphatase: 96 U/L (ref 38–126)
Anion gap: 9 (ref 5–15)
BUN: 18 mg/dL (ref 8–23)
CO2: 23 mmol/L (ref 22–32)
Calcium: 9.1 mg/dL (ref 8.9–10.3)
Chloride: 106 mmol/L (ref 98–111)
Creatinine, Ser: 0.78 mg/dL (ref 0.44–1.00)
GFR calc Af Amer: >60 mL/min (ref >60)
GFR calc non Af Amer: >60 mL/min (ref >60)
Glucose, Bld: 100 mg/dL — ABNORMAL HIGH (ref 70–99)
Potassium: 3.9 mmol/L (ref 3.5–5.1)
Sodium: 138 mmol/L (ref 135–145)
Total Bilirubin: 0.2 mg/dL — ABNORMAL LOW (ref 0.3–1.2)
Total Protein: 7.6 g/dL (ref 6.5–8.1)

## 2019-09-27 LAB — CBC WITH DIFFERENTIAL/PLATELET
Abs Immature Granulocytes: 0.02 10*3/uL (ref 0.00–0.07)
Basophils Absolute: 0.1 10*3/uL (ref 0.0–0.1)
Basophils Relative: 1 %
Eosinophils Absolute: 0.2 10*3/uL (ref 0.0–0.5)
Eosinophils Relative: 3 %
HCT: 34.7 % — ABNORMAL LOW (ref 36.0–46.0)
Hemoglobin: 10.5 g/dL — ABNORMAL LOW (ref 12.0–15.0)
Immature Granulocytes: 0 %
Lymphocytes Relative: 39 %
Lymphs Abs: 2.6 10*3/uL (ref 0.7–4.0)
MCH: 25 pg — ABNORMAL LOW (ref 26.0–34.0)
MCHC: 30.3 g/dL (ref 30.0–36.0)
MCV: 82.6 fL (ref 80.0–100.0)
Monocytes Absolute: 0.7 10*3/uL (ref 0.1–1.0)
Monocytes Relative: 10 %
Neutro Abs: 3.1 10*3/uL (ref 1.7–7.7)
Neutrophils Relative %: 47 %
Platelets: 250 10*3/uL (ref 150–400)
RBC: 4.2 MIL/uL (ref 3.87–5.11)
RDW: 18.6 % — ABNORMAL HIGH (ref 11.5–15.5)
WBC: 6.5 10*3/uL (ref 4.0–10.5)
nRBC: 0 % (ref 0.0–0.2)

## 2019-09-27 LAB — SARS CORONAVIRUS 2 (TAT 6-24 HRS): SARS Coronavirus 2: NEGATIVE

## 2019-09-27 NOTE — Progress Notes (Signed)

## 2019-09-30 ENCOUNTER — Encounter (HOSPITAL_COMMUNITY): Payer: Self-pay

## 2019-09-30 ENCOUNTER — Other Ambulatory Visit: Payer: Self-pay

## 2019-09-30 ENCOUNTER — Ambulatory Visit (HOSPITAL_COMMUNITY)
Admission: RE | Admit: 2019-09-30 | Discharge: 2019-09-30 | Disposition: A | Discharge status: Home / Self Care | Payer: Medicare PPO | Source: Ambulatory Visit | Attending: Hematology and Oncology | Admitting: Hematology and Oncology

## 2019-09-30 ENCOUNTER — Ambulatory Visit
Admission: RE | Admit: 2019-09-30 | Discharge: 2019-09-30 | Disposition: A | Discharge status: Home / Self Care | Payer: Medicare PPO | Source: Ambulatory Visit | Attending: Surgery | Admitting: Surgery

## 2019-09-30 DIAGNOSIS — C50411 Malignant neoplasm of upper-outer quadrant of right female breast: Secondary | ICD-10-CM | POA: Diagnosis present

## 2019-09-30 DIAGNOSIS — C50911 Malignant neoplasm of unspecified site of right female breast: Secondary | ICD-10-CM

## 2019-09-30 DIAGNOSIS — Z170 Estrogen receptor positive status [ER+]: Secondary | ICD-10-CM | POA: Diagnosis present

## 2019-09-30 MED ORDER — IOHEXOL 300 MG/ML  SOLN
100.0000 mL | Freq: Once | INTRAMUSCULAR | Status: AC | PRN
  Administered 2019-09-30: 100 mL via INTRAVENOUS

## 2019-09-30 MED ORDER — SODIUM CHLORIDE (PF) 0.9 % IJ SOLN
INTRAMUSCULAR | Status: AC
  Filled 2019-09-30: qty 50

## 2019-10-01 ENCOUNTER — Other Ambulatory Visit: Payer: Self-pay

## 2019-10-01 ENCOUNTER — Ambulatory Visit
Admission: RE | Admit: 2019-10-01 | Discharge: 2019-10-01 | Disposition: A | Discharge status: Home / Self Care | Payer: Medicare PPO | Source: Ambulatory Visit | Attending: Surgery | Admitting: Surgery

## 2019-10-01 ENCOUNTER — Encounter (HOSPITAL_BASED_OUTPATIENT_CLINIC_OR_DEPARTMENT_OTHER): Payer: Self-pay | Admitting: Surgery

## 2019-10-01 ENCOUNTER — Ambulatory Visit (HOSPITAL_COMMUNITY)
Admission: RE | Admit: 2019-10-01 | Discharge: 2019-10-01 | Disposition: A | Discharge status: Home / Self Care | Payer: Medicare PPO | Source: Ambulatory Visit | Attending: Surgery | Admitting: Surgery

## 2019-10-01 ENCOUNTER — Telehealth: Payer: Self-pay | Admitting: Hematology and Oncology

## 2019-10-01 ENCOUNTER — Ambulatory Visit (HOSPITAL_BASED_OUTPATIENT_CLINIC_OR_DEPARTMENT_OTHER)
Admission: RE | Admit: 2019-10-01 | Discharge: 2019-10-01 | Disposition: A | Discharge status: Home / Self Care | Payer: Medicare PPO | Attending: Surgery | Admitting: Surgery

## 2019-10-01 ENCOUNTER — Encounter (HOSPITAL_BASED_OUTPATIENT_CLINIC_OR_DEPARTMENT_OTHER)
Admission: RE | Disposition: A | Discharge status: Home / Self Care | Payer: Self-pay | Source: Home / Self Care | Attending: Surgery

## 2019-10-01 ENCOUNTER — Ambulatory Visit (HOSPITAL_BASED_OUTPATIENT_CLINIC_OR_DEPARTMENT_OTHER): Payer: Medicare PPO | Admitting: Anesthesiology

## 2019-10-01 ENCOUNTER — Encounter: Payer: Self-pay | Admitting: *Deleted

## 2019-10-01 DIAGNOSIS — I10 Essential (primary) hypertension: Secondary | ICD-10-CM | POA: Diagnosis not present

## 2019-10-01 DIAGNOSIS — M1990 Unspecified osteoarthritis, unspecified site: Secondary | ICD-10-CM | POA: Insufficient documentation

## 2019-10-01 DIAGNOSIS — E7800 Pure hypercholesterolemia, unspecified: Secondary | ICD-10-CM | POA: Diagnosis not present

## 2019-10-01 DIAGNOSIS — C50911 Malignant neoplasm of unspecified site of right female breast: Secondary | ICD-10-CM

## 2019-10-01 DIAGNOSIS — Z170 Estrogen receptor positive status [ER+]: Secondary | ICD-10-CM

## 2019-10-01 DIAGNOSIS — F419 Anxiety disorder, unspecified: Secondary | ICD-10-CM | POA: Insufficient documentation

## 2019-10-01 DIAGNOSIS — Z79899 Other long term (current) drug therapy: Secondary | ICD-10-CM | POA: Diagnosis not present

## 2019-10-01 DIAGNOSIS — C50211 Malignant neoplasm of upper-inner quadrant of right female breast: Secondary | ICD-10-CM | POA: Insufficient documentation

## 2019-10-01 DIAGNOSIS — Z6838 Body mass index (BMI) 38.0-38.9, adult: Secondary | ICD-10-CM | POA: Diagnosis not present

## 2019-10-01 DIAGNOSIS — G4730 Sleep apnea, unspecified: Secondary | ICD-10-CM | POA: Diagnosis not present

## 2019-10-01 DIAGNOSIS — F329 Major depressive disorder, single episode, unspecified: Secondary | ICD-10-CM | POA: Insufficient documentation

## 2019-10-01 DIAGNOSIS — Z87891 Personal history of nicotine dependence: Secondary | ICD-10-CM | POA: Diagnosis not present

## 2019-10-01 HISTORY — DX: Sleep apnea, unspecified: G47.30

## 2019-10-01 HISTORY — PX: BREAST LUMPECTOMY WITH RADIOACTIVE SEED AND SENTINEL LYMPH NODE BIOPSY: SHX6550

## 2019-10-01 HISTORY — PX: BREAST LUMPECTOMY: SHX2

## 2019-10-01 SURGERY — BREAST LUMPECTOMY WITH RADIOACTIVE SEED AND SENTINEL LYMPH NODE BIOPSY
Anesthesia: Regional | Site: Breast | Laterality: Right

## 2019-10-01 MED ORDER — FENTANYL CITRATE (PF) 100 MCG/2ML IJ SOLN: 25.0000 ug | INTRAMUSCULAR | Status: DC | PRN

## 2019-10-01 MED ORDER — CEFAZOLIN SODIUM-DEXTROSE 2-4 GM/100ML-% IV SOLN
INTRAVENOUS | Status: AC
  Filled 2019-10-01: qty 100

## 2019-10-01 MED ORDER — MIDAZOLAM HCL 2 MG/2ML IJ SOLN
INTRAMUSCULAR | Status: AC
  Filled 2019-10-01: qty 2

## 2019-10-01 MED ORDER — IBUPROFEN 800 MG PO TABS
800.0000 mg | ORAL_TABLET | Freq: Three times a day (TID) | ORAL | 0 refills | Status: DC | PRN
Start: 2019-10-01 — End: 2019-11-21

## 2019-10-01 MED ORDER — FENTANYL CITRATE (PF) 100 MCG/2ML IJ SOLN
100.0000 ug | Freq: Once | INTRAMUSCULAR | Status: AC
  Administered 2019-10-01: 100 ug via INTRAVENOUS

## 2019-10-01 MED ORDER — EPHEDRINE SULFATE 50 MG/ML IJ SOLN
INTRAMUSCULAR | Status: DC | PRN
  Administered 2019-10-01 (×2): 10 mg via INTRAVENOUS
  Administered 2019-10-01: 20 mg via INTRAVENOUS
  Administered 2019-10-01: 10 mg via INTRAVENOUS

## 2019-10-01 MED ORDER — SODIUM CHLORIDE (PF) 0.9 % IJ SOLN
INTRAMUSCULAR | Status: AC
  Filled 2019-10-01: qty 10

## 2019-10-01 MED ORDER — BUPIVACAINE HCL (PF) 0.25 % IJ SOLN
INTRAMUSCULAR | Status: DC | PRN
  Administered 2019-10-01: 18 mL

## 2019-10-01 MED ORDER — FENTANYL CITRATE (PF) 100 MCG/2ML IJ SOLN
INTRAMUSCULAR | Status: AC
  Filled 2019-10-01: qty 2

## 2019-10-01 MED ORDER — METHYLENE BLUE 0.5 % INJ SOLN
INTRAVENOUS | Status: AC
  Filled 2019-10-01: qty 10

## 2019-10-01 MED ORDER — LIDOCAINE 2% (20 MG/ML) 5 ML SYRINGE
INTRAMUSCULAR | Status: AC
  Filled 2019-10-01: qty 5

## 2019-10-01 MED ORDER — SODIUM CHLORIDE (PF) 0.9 % IJ SOLN: INTRAVENOUS | Status: DC | PRN

## 2019-10-01 MED ORDER — DEXAMETHASONE SODIUM PHOSPHATE 4 MG/ML IJ SOLN
INTRAMUSCULAR | Status: DC | PRN
  Administered 2019-10-01: 10 mg via INTRAVENOUS

## 2019-10-01 MED ORDER — LIDOCAINE HCL (CARDIAC) PF 100 MG/5ML IV SOSY
PREFILLED_SYRINGE | INTRAVENOUS | Status: DC | PRN
  Administered 2019-10-01: 60 mg via INTRAVENOUS

## 2019-10-01 MED ORDER — PROMETHAZINE HCL 25 MG/ML IJ SOLN: 6.2500 mg | INTRAMUSCULAR | Status: DC | PRN

## 2019-10-01 MED ORDER — HYDROCODONE-ACETAMINOPHEN 5-325 MG PO TABS: 1.0000 | ORAL_TABLET | Freq: Four times a day (QID) | ORAL | 0 refills | Status: DC | PRN

## 2019-10-01 MED ORDER — BUPIVACAINE-EPINEPHRINE (PF) 0.5% -1:200000 IJ SOLN
INTRAMUSCULAR | Status: DC | PRN
  Administered 2019-10-01: 30 mL

## 2019-10-01 MED ORDER — LACTATED RINGERS IV SOLN: INTRAVENOUS | Status: DC

## 2019-10-01 MED ORDER — BUPIVACAINE HCL (PF) 0.25 % IJ SOLN
INTRAMUSCULAR | Status: AC
  Filled 2019-10-01: qty 30

## 2019-10-01 MED ORDER — PROPOFOL 10 MG/ML IV BOLUS
INTRAVENOUS | Status: AC
  Filled 2019-10-01: qty 20

## 2019-10-01 MED ORDER — CEFAZOLIN SODIUM-DEXTROSE 2-4 GM/100ML-% IV SOLN
2.0000 g | INTRAVENOUS | Status: AC
  Administered 2019-10-01: 2 g via INTRAVENOUS

## 2019-10-01 MED ORDER — DEXAMETHASONE SODIUM PHOSPHATE 10 MG/ML IJ SOLN
INTRAMUSCULAR | Status: AC
  Filled 2019-10-01: qty 1

## 2019-10-01 MED ORDER — FENTANYL CITRATE (PF) 100 MCG/2ML IJ SOLN
INTRAMUSCULAR | Status: DC | PRN
  Administered 2019-10-01 (×2): 25 ug via INTRAVENOUS
  Administered 2019-10-01: 50 ug via INTRAVENOUS

## 2019-10-01 MED ORDER — MIDAZOLAM HCL 2 MG/2ML IJ SOLN
2.0000 mg | Freq: Once | INTRAMUSCULAR | Status: AC
  Administered 2019-10-01: 2 mg via INTRAVENOUS

## 2019-10-01 MED ORDER — PROPOFOL 10 MG/ML IV BOLUS
INTRAVENOUS | Status: DC | PRN
  Administered 2019-10-01: 200 mg via INTRAVENOUS

## 2019-10-01 MED ORDER — CHLORHEXIDINE GLUCONATE CLOTH 2 % EX PADS: 6.0000 | MEDICATED_PAD | Freq: Once | CUTANEOUS | Status: DC

## 2019-10-01 MED ORDER — TECHNETIUM TC 99M SULFUR COLLOID FILTERED
1.0000 | Freq: Once | INTRAVENOUS | Status: AC | PRN
  Administered 2019-10-01: 1 via INTRADERMAL

## 2019-10-01 SURGICAL SUPPLY — 55 items
ADH SKN CLS APL DERMABOND .7 (GAUZE/BANDAGES/DRESSINGS) ×1
APL PRP STRL LF DISP 70% ISPRP (MISCELLANEOUS) ×1
APPLIER CLIP 9.375 MED OPEN (MISCELLANEOUS) ×2
APR CLP MED 9.3 20 MLT OPN (MISCELLANEOUS) ×1
BINDER BREAST LRG (GAUZE/BANDAGES/DRESSINGS) IMPLANT
BINDER BREAST MEDIUM (GAUZE/BANDAGES/DRESSINGS) IMPLANT
BINDER BREAST XLRG (GAUZE/BANDAGES/DRESSINGS) IMPLANT
BINDER BREAST XXLRG (GAUZE/BANDAGES/DRESSINGS) ×1 IMPLANT
BLADE SURG 15 STRL LF DISP TIS (BLADE) ×1 IMPLANT
BLADE SURG 15 STRL SS (BLADE) ×2
CANISTER SUC SOCK COL 7IN (MISCELLANEOUS) IMPLANT
CANISTER SUCT 1200ML W/VALVE (MISCELLANEOUS) ×2 IMPLANT
CHLORAPREP W/TINT 26 (MISCELLANEOUS) ×2 IMPLANT
CLIP APPLIE 9.375 MED OPEN (MISCELLANEOUS) ×1 IMPLANT
COVER BACK TABLE 60X90IN (DRAPES) ×2 IMPLANT
COVER MAYO STAND STRL (DRAPES) ×2 IMPLANT
COVER PROBE W GEL 5X96 (DRAPES) ×2 IMPLANT
COVER WAND RF STERILE (DRAPES) IMPLANT
DECANTER SPIKE VIAL GLASS SM (MISCELLANEOUS) IMPLANT
DERMABOND ADVANCED (GAUZE/BANDAGES/DRESSINGS) ×1
DERMABOND ADVANCED .7 DNX12 (GAUZE/BANDAGES/DRESSINGS) ×1 IMPLANT
DRAPE LAPAROSCOPIC ABDOMINAL (DRAPES) ×2 IMPLANT
DRAPE UTILITY XL STRL (DRAPES) ×2 IMPLANT
ELECT COATED BLADE 2.86 ST (ELECTRODE) ×2 IMPLANT
ELECT REM PT RETURN 9FT ADLT (ELECTROSURGICAL) ×2
ELECTRODE REM PT RTRN 9FT ADLT (ELECTROSURGICAL) ×1 IMPLANT
GLOVE BIO SURGEON STRL SZ7 (GLOVE) ×1 IMPLANT
GLOVE BIOGEL PI IND STRL 7.0 (GLOVE) IMPLANT
GLOVE BIOGEL PI IND STRL 8 (GLOVE) ×1 IMPLANT
GLOVE BIOGEL PI INDICATOR 7.0 (GLOVE) ×1
GLOVE BIOGEL PI INDICATOR 8 (GLOVE) ×1
GLOVE ECLIPSE 8.0 STRL XLNG CF (GLOVE) ×2 IMPLANT
GOWN STRL REUS W/ TWL LRG LVL3 (GOWN DISPOSABLE) ×2 IMPLANT
GOWN STRL REUS W/ TWL XL LVL3 (GOWN DISPOSABLE) IMPLANT
GOWN STRL REUS W/TWL LRG LVL3 (GOWN DISPOSABLE) ×2
GOWN STRL REUS W/TWL XL LVL3 (GOWN DISPOSABLE) ×2
HEMOSTAT ARISTA ABSORB 3G PWDR (HEMOSTASIS) IMPLANT
HEMOSTAT SNOW SURGICEL 2X4 (HEMOSTASIS) IMPLANT
KIT MARKER MARGIN INK (KITS) ×2 IMPLANT
NDL HYPO 25X1 1.5 SAFETY (NEEDLE) ×1 IMPLANT
NDL SAFETY ECLIPSE 18X1.5 (NEEDLE) IMPLANT
NEEDLE HYPO 18GX1.5 SHARP (NEEDLE)
NEEDLE HYPO 25X1 1.5 SAFETY (NEEDLE) ×4 IMPLANT
NS IRRIG 1000ML POUR BTL (IV SOLUTION) ×2 IMPLANT
PACK BASIN DAY SURGERY FS (CUSTOM PROCEDURE TRAY) ×2 IMPLANT
PENCIL SMOKE EVACUATOR (MISCELLANEOUS) ×2 IMPLANT
SLEEVE SCD COMPRESS KNEE MED (MISCELLANEOUS) ×2 IMPLANT
SPONGE LAP 4X18 RFD (DISPOSABLE) ×3 IMPLANT
SUT MNCRL AB 4-0 PS2 18 (SUTURE) ×3 IMPLANT
SUT VICRYL 3-0 CR8 SH (SUTURE) ×3 IMPLANT
SYR CONTROL 10ML LL (SYRINGE) ×3 IMPLANT
TOWEL GREEN STERILE FF (TOWEL DISPOSABLE) ×2 IMPLANT
TRAY FAXITRON CT DISP (TRAY / TRAY PROCEDURE) ×2 IMPLANT
TUBE CONNECTING 20X1/4 (TUBING) ×2 IMPLANT
YANKAUER SUCT BULB TIP NO VENT (SUCTIONS) ×2 IMPLANT

## 2019-10-01 NOTE — Progress Notes (Signed)
error    This encounter was created in error - please disregard.

## 2019-10-01 NOTE — Transfer of Care (Signed)
Immediate Anesthesia Transfer of Care Note  Patient: [name]  Procedure(s) Performed: RIGHT BREAST LUMPECTOMY WITH RADIOACTIVE SEED AND SENTINEL LYMPH NODE BIOPSY (Right Breast)  Patient Location: PACU  Anesthesia Type:GA combined with regional for post-op pain  Level of Consciousness: sedated  Airway & Oxygen Therapy: Patient Spontanous Breathing and Patient connected to face mask oxygen  Post-op Assessment: Report given to RN and Post -op Vital signs reviewed and stable  Post vital signs: Reviewed and stable  Last Vitals:  Vitals Value Taken Time  BP 111/80 10/01/19 1402  Temp    Pulse 85 10/01/19 1406  Resp 15 10/01/19 1406  SpO2 100 % 10/01/19 1406  Vitals shown include unvalidated device data.  Last Pain:  Vitals:   10/01/19 1112  PainSc: 10-Worst pain ever      Patients Stated Pain Goal: 4 (75/[id number])  Complications: No complications documented.

## 2019-10-01 NOTE — Anesthesia Procedure Notes (Signed)
Procedure Name: LMA Insertion Date/Time: 10/01/2019 12:52 PM Performed by: Maryella Shivers, CRNA Pre-anesthesia Checklist: Patient identified, Emergency Drugs available, Suction available and Patient being monitored Patient Re-evaluated:Patient Re-evaluated prior to induction Oxygen Delivery Method: Circle system utilized Preoxygenation: Pre-oxygenation with 100% oxygen Induction Type: IV induction Ventilation: Mask ventilation without difficulty LMA: LMA inserted LMA Size: 4.0 Number of attempts: 1 Airway Equipment and Method: Bite block Placement Confirmation: positive ETCO2 Tube secured with: Tape Dental Injury: Teeth and Oropharynx as per pre-operative assessment

## 2019-10-01 NOTE — Anesthesia Postprocedure Evaluation (Signed)
Anesthesia Post Note  Patient: [name]  Procedure(s) Performed: RIGHT BREAST LUMPECTOMY WITH RADIOACTIVE SEED AND SENTINEL LYMPH NODE BIOPSY (Right Breast)     Patient location during evaluation: Phase II Anesthesia Type: Regional Level of consciousness: awake Pain management: pain level controlled Vital Signs Assessment: post-procedure vital signs reviewed and stable Respiratory status: spontaneous breathing Cardiovascular status: stable Postop Assessment: no apparent nausea or vomiting Anesthetic complications: no   No complications documented.  Last Vitals:  Vitals:   10/01/19 1430 10/01/19 1444  BP: 117/79 123/79  Pulse: 77 77  Resp: 18 18  Temp:    SpO2: 98% 96%    Last Pain:  Vitals:   10/01/19 1444  PainSc: 2                  John F Salome Arnt

## 2019-10-01 NOTE — Op Note (Signed)
Preoperative diagnosis: Stage I right breast cancer upper inner quadrant  Postoperative diagnosis: Same  Procedure: Right breast seed lumpectomy with right axillary sentinel lymph node mapping using methylene blue dye  Surgeon: Erroll Luna, MD  Anesthesia: LMA with 0.25% Marcaine plain and pectoral block  EBL: Minimal  Specimen: Right breast tissue with seed and clip verified by Faxitron and 3 right axillary sentinel nodes blue and hot  Drains: None  Indications for procedure: The patient is a 69 year old female with stage I right breast cancer.  The patient opted for breast conserving surgery after being met in the multidisciplinary clinic.The procedure has been discussed with the patient. Alternatives to surgery have been discussed with the patient.  Risks of surgery include bleeding,  Infection,  Seroma formation, death,  and the need for further surgery.   The patient understands and wishes to proceed.Sentinel lymph node mapping and dissection has been discussed with the patient.  Risk of bleeding,  Infection, lymphedema seroma formation,  Additional procedures,,  Shoulder weakness ,  Shoulder stiffness,  Nerve and blood vessel injury and reaction to the mapping dyes have been discussed.  Alternatives to surgery have been discussed with the patient.  The patient agrees to proceed.  Description of procedure: The patient was met in the holding area and questions were answered.  She underwent pectoral block per anesthesia on the right.  She underwent injection of technetium sulfur colloid per radiology protocol.  Neoprobe was used to identify the seed in the right breast.  All questions were answered and the right side was marked as correct.  She was taken back the operating.  She is placed supine upon the OR table.  After induction of general anesthesia, right breast was prepped and draped in sterile fashion a timeout was performed.  Proper patient, site and procedure were verified.  Neoprobe  was used and seed identified in the right upper medial breast.  Transverse incision was made.  Dissection was carried down all tissue around the seed and clip were excised with a grossly negative margin.  The cavity was clipped.  It was then closed with 3-0 Vicryl and 4-0 Monocryl.  The specimen was oriented with ink and images on the Faxitron revealed the seed and clip to be present.  4 cc of methylene blue dye were injected in a subareolar position and massaged for 5 minutes due to poor nuc med signal.  This was massaged for 5 minutes.  Incision was made the right axilla.  The signal did improve.  3 blue and hot nodes were identified in the right axillary basin level 1.  These were dissected out.  Background counts approached 0.  Hemostasis was achieved.  Wound closed with 3-0 Vicryl and 4 Monocryl.  Dermabond applied to all incisions.  All counts were found to be correct.  The patient was awoke extubated taken to recovery in satisfactory condition.

## 2019-10-01 NOTE — Interval H&P Note (Signed)
History and Physical Interval Note:  10/01/2019 12:00 PM  [name]  has presented today for surgery, with the diagnosis of RIGHT BREAST CANCER.  The various methods of treatment have been discussed with the patient and family. After consideration of risks, benefits and other options for treatment, the patient has consented to  Procedure(s) with comments: RIGHT BREAST LUMPECTOMY WITH RADIOACTIVE SEED AND SENTINEL LYMPH NODE BIOPSY (Right) - PEC BLOCK as a surgical intervention.  The patient's history has been reviewed, patient examined, no change in status, stable for surgery.  I have reviewed the patient's chart and labs.  Questions were answered to the patient's satisfaction.     Turner Daniels MD

## 2019-10-01 NOTE — Progress Notes (Signed)
Assisted Dr. Elgie Congo with right, ultrasound guided, pectoralis block. Side rails up, monitors on throughout procedure. See vital signs in flow sheet. Tolerated Procedure well.

## 2019-10-01 NOTE — Anesthesia Preprocedure Evaluation (Addendum)
Anesthesia Evaluation  Patient identified by MRN, date of birth, ID band  Reviewed: Allergy & Precautions, NPO status , Patient's Chart, lab work & pertinent test results  Airway Mallampati: II  TM Distance: >3 FB Neck ROM: Full    Dental no notable dental hx.    Pulmonary sleep apnea , former smoker,    Pulmonary exam normal breath sounds clear to auscultation       Cardiovascular Exercise Tolerance: Good hypertension, Normal cardiovascular exam Rhythm:Regular Rate:Normal  27-Sep-2019 14:35:39 [facility] ROUTINE RECORD Normal sinus rhythm Nonspecific T wave abnormality No significant change since last tracing   Neuro/Psych PSYCHIATRIC DISORDERS Anxiety Depression    GI/Hepatic GERD  ,  Endo/Other  Morbid obesity  Renal/GU Renal disease  negative genitourinary   Musculoskeletal  (+) Arthritis ,   Abdominal   Peds  Hematology  (+) anemia ,   Anesthesia Other Findings   Reproductive/Obstetrics                            Anesthesia Physical Anesthesia Plan  ASA: III  Anesthesia Plan: General and Regional   Post-op Pain Management: GA combined w/ Regional for post-op pain   Induction: Intravenous  PONV Risk Score and Plan: 3 and Dexamethasone, Midazolam and Treatment may vary due to age or medical condition  Airway Management Planned: LMA  Additional Equipment:   Intra-op Plan:   Post-operative Plan:   Informed Consent: I have reviewed the patients History and Physical, chart, labs and discussed the procedure including the risks, benefits and alternatives for the proposed anesthesia with the patient or authorized representative who has indicated his/her understanding and acceptance.       Plan Discussed with:   Anesthesia Plan Comments:         Anesthesia Quick Evaluation

## 2019-10-01 NOTE — Anesthesia Procedure Notes (Addendum)
Anesthesia Regional Block: Pectoralis block   Pre-Anesthetic Checklist: ,, timeout performed, Correct Patient, Correct Site, Correct Laterality, Correct Procedure, Correct Position, site marked, Risks and benefits discussed,  Surgical consent,  Pre-op evaluation,  At surgeon's request and post-op pain management  Laterality: Right  Prep: chloraprep       Needles:  Injection technique: Single-shot  Needle Type: Stimiplex     Needle Length: 9cm  Needle Gauge: 21     Additional Needles:   Procedures:,,,, ultrasound used (permanent image in chart),,,,  Narrative:  Start time: 10/01/2019 11:32 AM End time: 10/01/2019 11:42 AM Injection made incrementally with aspirations every 5 mL.  Performed by: Personally  Anesthesiologist: Merlinda Frederick, MD

## 2019-10-01 NOTE — Progress Notes (Signed)
Nuc med injections completed. Patient tolerated well.   

## 2019-10-01 NOTE — Discharge Instructions (Signed)
[facility] °Office Phone Number [phone] ° °BREAST BIOPSY/ PARTIAL MASTECTOMY: POST OP INSTRUCTIONS ° °Always review your discharge instruction sheet given to you by the facility where your surgery was performed. ° °IF YOU HAVE DISABILITY OR FAMILY LEAVE FORMS, YOU MUST BRING THEM TO THE OFFICE FOR PROCESSING.  DO NOT GIVE THEM TO YOUR DOCTOR. ° °1. A prescription for pain medication may be given to you upon discharge.  Take your pain medication as prescribed, if needed.  If narcotic pain medicine is not needed, then you may take acetaminophen (Tylenol) or ibuprofen (Advil) as needed. °2. Take your usually prescribed medications unless otherwise directed °3. If you need a refill on your pain medication, please contact your pharmacy.  They will contact our office to request authorization.  Prescriptions will not be filled after 5pm or on week-ends. °4. You should eat very light the first 24 hours after surgery, such as soup, crackers, pudding, etc.  Resume your normal diet the day after surgery. °5. Most patients will experience some swelling and bruising in the breast.  Ice packs and a good support bra will help.  Swelling and bruising can take several days to resolve.  °6. It is common to experience some constipation if taking pain medication after surgery.  Increasing fluid intake and taking a stool softener will usually help or prevent this problem from occurring.  A mild laxative (Milk of Magnesia or Miralax) should be taken according to package directions if there are no bowel movements after 48 hours. °7. Unless discharge instructions indicate otherwise, you may remove your bandages 24-48 hours after surgery, and you may shower at that time.  You may have steri-strips (small skin tapes) in place directly over the incision.  These strips should be left on the skin for 7-10 days.  If your surgeon used skin glue on the incision, you may shower in 24 hours.  The glue will flake off over the  next 2-3 weeks.  Any sutures or staples will be removed at the office during your follow-up visit. °8. ACTIVITIES:  You may resume regular daily activities (gradually increasing) beginning the next day.  Wearing a good support bra or sports bra minimizes pain and swelling.  You may have sexual intercourse when it is comfortable. °a. You may drive when you no longer are taking prescription pain medication, you can comfortably wear a seatbelt, and you can safely maneuver your car and apply brakes. °b. RETURN TO WORK:  ______________________________________________________________________________________ °9. You should see your doctor in the office for a follow-up appointment approximately two weeks after your surgery.  Your doctor’s nurse will typically make your follow-up appointment when she calls you with your pathology report.  Expect your pathology report 2-3 business days after your surgery.  You may call to check if you do not hear from us after three days. °10. OTHER INSTRUCTIONS: _______________________________________________________________________________________________ _____________________________________________________________________________________________________________________________________ °_____________________________________________________________________________________________________________________________________ °_____________________________________________________________________________________________________________________________________ ° °WHEN TO CALL YOUR DOCTOR: °1. Fever over 101.0 °2. Nausea and/or vomiting. °3. Extreme swelling or bruising. °4. Continued bleeding from incision. °5. Increased pain, redness, or drainage from the incision. ° °The clinic staff is available to answer your questions during regular business hours.  Please don’t hesitate to call and ask to speak to one of the nurses for clinical concerns.  If you have a medical emergency, go to the nearest  emergency room or call 911.  A surgeon from [facility] is always on call at the hospital. ° °For further questions, please visit [email or web address]  ° ° ° ° °  Post Anesthesia Home Care Instructions ° °Activity: °Get plenty of rest for the remainder of the day. A responsible individual must stay with you for 24 hours following the procedure.  °For the next 24 hours, DO NOT: °-Drive a car °-Operate machinery °-Drink alcoholic beverages °-Take any medication unless instructed by your physician °-Make any legal decisions or sign important papers. ° °Meals: °Start with liquid foods such as gelatin or soup. Progress to regular foods as tolerated. Avoid greasy, spicy, heavy foods. If nausea and/or vomiting occur, drink only clear liquids until the nausea and/or vomiting subsides. Call your physician if vomiting continues. ° °Special Instructions/Symptoms: °Your throat may feel dry or sore from the anesthesia or the breathing tube placed in your throat during surgery. If this causes discomfort, gargle with warm salt water. The discomfort should disappear within 24 hours. ° °If you had a scopolamine patch placed behind your ear for the management of post- operative nausea and/or vomiting: ° °1. The medication in the patch is effective for 72 hours, after which it should be removed.  Wrap patch in a tissue and discard in the trash. Wash hands thoroughly with soap and water. °2. You may remove the patch earlier than 72 hours if you experience unpleasant side effects which may include dry mouth, dizziness or visual disturbances. °3. Avoid touching the patch. Wash your hands with soap and water after contact with the patch. °  ° °

## 2019-10-02 ENCOUNTER — Encounter (HOSPITAL_BASED_OUTPATIENT_CLINIC_OR_DEPARTMENT_OTHER): Payer: Self-pay | Admitting: Surgery

## 2019-10-02 ENCOUNTER — Inpatient Hospital Stay: Payer: Medicare PPO | Admitting: Hematology and Oncology

## 2019-10-04 LAB — SURGICAL PATHOLOGY

## 2019-10-07 ENCOUNTER — Encounter: Payer: Self-pay | Admitting: *Deleted

## 2019-10-07 NOTE — Progress Notes (Signed)
Received order for oncotype testing. Requisition faxed to pathology and [facility] °

## 2019-10-07 NOTE — Progress Notes (Signed)
Patient Care Team: Everardo Beals, NP as PCP - General Mauro Kaufmann, RN as Oncology Nurse Navigator Rockwell Germany, RN as Oncology Nurse Navigator  DIAGNOSIS:    ICD-10-CM   1. Malignant neoplasm of upper-outer quadrant of right breast in female, estrogen receptor positive ([place])  C50.411    Z17.0     SUMMARY OF ONCOLOGIC HISTORY: Oncology History  Malignant neoplasm of upper-outer quadrant of right breast in female, estrogen receptor positive ([place])  09/17/2019 Initial Diagnosis   Screening mammogram detected a right breast mass, not palpable on exam. Diagnostic mammogram showed 1.0cm mass at the 12:30 position in the right breast, with a mildly abnormal right axillary lymph node, 4.54m. Biopsy showed IDC in the breast, grade 3, HER-2 negative (1+), ER+ 70%, PR- 0%, Ki67 85%, and the lymph node negative for carcinoma.   09/24/2019 Cancer Staging   Staging form: Breast, AJCC 8th Edition - Clinical stage from 09/24/2019: Stage IB (cT1b, cN0(f), cM0, G3, ER+, PR-, HER2-) - Signed by GNicholas Lose MD on 10/08/2019   10/01/2019 Surgery   Right lumpectomy ([facility]): IDC, grade 3, 2.8cm, clear margins, 7 right axillary lymph nodes negative for carcinoma.     CHIEF COMPLIANT: Follow-up s/p right lumpectomy   INTERVAL HISTORY: Martha SAMONTEis a 69y.o. with above-mentioned history of right breast cancer. [name] underwent a right lumpectomy on 10/01/19 with Dr. 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 [name] presents to the clinic today to discuss the pathology report and further treatment.   ALLERGIES:  is allergic to ceftin [cefuroxime] and zofran [ondansetron].  MEDICATIONS:  Current Outpatient Medications  Medication Sig Dispense Refill  . ALPRAZolam (XANAX) 0.5 MG tablet Take 0.25-0.5 mg by mouth 2 (two) times daily as needed. For anxiety    . amLODipine-benazepril (LOTREL)  10-20 MG per capsule Take 1 capsule by mouth daily.    . cholecalciferol (VITAMIN D) 400 UNITS TABS Take 400 Units by mouth daily.    . eszopiclone (LUNESTA) 2 MG TABS Take 3 mg by mouth at bedtime as needed for sleep. Take immediately before bedtime    . hydrochlorothiazide (HYDRODIURIL) 25 MG tablet Take 25 mg by mouth daily.    .Marland KitchenHYDROcodone-acetaminophen (NORCO/VICODIN) 5-325 MG tablet Take 1 tablet by mouth every 6 (six) hours as needed for moderate pain. 15 tablet 0  . ibuprofen (ADVIL) 800 MG tablet Take 1 tablet (800 mg total) by mouth every 8 (eight) hours as needed. 30 tablet 0  . Omeprazole (PRILOSEC PO) Take by mouth.    . rosuvastatin (CRESTOR) 10 MG tablet     . sertraline (ZOLOFT) 100 MG tablet Take 200 mg by mouth daily.     No current facility-administered medications for this visit.    PHYSICAL EXAMINATION: ECOG PERFORMANCE STATUS: 1 - Symptomatic but completely ambulatory  There were no vitals filed for this visit. There were no vitals filed for this visit.  LABORATORY DATA:  I have reviewed the data as listed CMP Latest Ref Rng & Units 09/27/2019 08/05/2014 08/04/2014  Glucose 70 - 99 mg/dL 100(H) 106(H) 115(H)  BUN 8 - 23 mg/dL '18 11 20  ' Creatinine 0.44 - 1.00 mg/dL 0.78 0.83 1.14(H)  Sodium 135 - 145 mmol/L 138 138 136  Potassium 3.5 - 5.1 mmol/L 3.9 3.7 3.6  Chloride 98 - 111 mmol/L 106 106 106  CO2 22 - 32 mmol/L 23 20(L) 23  Calcium 8.9 -  10.3 mg/dL 9.1 8.7(L) 8.2(L)  Total Protein 6.5 - 8.1 g/dL 7.6 - -  Total Bilirubin 0.3 - 1.2 mg/dL 0.2(L) - -  Alkaline Phos 38 - 126 U/L 96 - -  AST 15 - 41 U/L 43(H) - -  ALT 0 - 44 U/L 32 - -    Lab Results  Component Value Date   WBC 6.5 09/27/2019   HGB 10.5 (L) 09/27/2019   HCT 34.7 (L) 09/27/2019   MCV 82.6 09/27/2019   PLT 250 09/27/2019   NEUTROABS 3.1 09/27/2019    ASSESSMENT & PLAN:  Malignant neoplasm of upper-outer quadrant of right breast in female, estrogen receptor positive ([place]) 10/01/2019:Right  lumpectomy ([facility]): IDC, grade 3, 2.8cm, clear margins, 7 right axillary lymph nodes negative for carcinoma.  ER 90%, PR 0%, Ki-67 85%, HER-2 negative T2N0 stage Ib  Pathology counseling: I discussed the final pathology report of the patient provided  a copy of this report. I discussed the margins as well as lymph node surgeries. We also discussed the final staging along with previously performed ER/PR and HER-2/neu testing.  Treatment plan: 1. Oncotype DX testing to determine if chemotherapy would be of any benefit followed by 2. Adjuvant radiation therapy followed by 3. Adjuvant antiestrogen therapy  Chronic back issues: Had seen orthopedics in the past and had scans of all kinds done before. CT of the chest abdomen and pelvis: 09/30/2019: Right breast lesion, internal mammary lymph nodes, upper abdominal nodal enlargement (probably related to liver cirrhosis) small lung nodules No clear-cut evidence of metastatic disease.  Return to clinic based upon Oncotype DX test result    No orders of the defined types were placed in this encounter.  The patient has a good understanding of the overall plan. [name] agrees with it. [name] will call with any problems that may develop before the next visit here.  Total time spent: 30 mins including face to face time and time spent for planning, charting and coordination of care  Nicholas Lose, MD 10/08/2019  I, Cloyde Reams Dorshimer, am acting as scribe for Dr. Nicholas Lose.  I have reviewed the above documentation for accuracy and completeness, and I agree with the above.

## 2019-10-08 ENCOUNTER — Other Ambulatory Visit: Payer: Self-pay

## 2019-10-08 ENCOUNTER — Inpatient Hospital Stay: Payer: Medicare PPO | Admitting: Hematology and Oncology

## 2019-10-08 DIAGNOSIS — C50411 Malignant neoplasm of upper-outer quadrant of right female breast: Secondary | ICD-10-CM

## 2019-10-08 DIAGNOSIS — Z170 Estrogen receptor positive status [ER+]: Secondary | ICD-10-CM

## 2019-10-08 NOTE — Assessment & Plan Note (Signed)
10/01/2019:Right lumpectomy ([facility]): IDC, grade 3, 2.8cm, clear margins, 7 right axillary lymph nodes negative for carcinoma.  ER 90%, PR 0%, Ki-67 85%, HER-2 negative T2N0 stage Ib  Pathology counseling: I discussed the final pathology report of the patient provided  a copy of this report. I discussed the margins as well as lymph node surgeries. We also discussed the final staging along with previously performed ER/PR and HER-2/neu testing.  Treatment plan: 1. Oncotype DX testing to determine if chemotherapy would be of any benefit followed by 2. Adjuvant radiation therapy followed by 3. Adjuvant antiestrogen therapy  Return to clinic based upon Oncotype DX test result

## 2019-10-09 ENCOUNTER — Telehealth: Payer: Self-pay | Admitting: Hematology and Oncology

## 2019-10-09 NOTE — Telephone Encounter (Signed)
No 8/24 los. No changes made to pt's schedule.  

## 2019-10-22 ENCOUNTER — Encounter: Payer: Self-pay | Admitting: Rehabilitation

## 2019-10-22 ENCOUNTER — Ambulatory Visit: Payer: Medicare PPO | Attending: Hematology and Oncology | Admitting: Rehabilitation

## 2019-10-22 ENCOUNTER — Other Ambulatory Visit: Payer: Self-pay

## 2019-10-22 DIAGNOSIS — Z170 Estrogen receptor positive status [ER+]: Secondary | ICD-10-CM | POA: Insufficient documentation

## 2019-10-22 DIAGNOSIS — C50411 Malignant neoplasm of upper-outer quadrant of right female breast: Secondary | ICD-10-CM | POA: Insufficient documentation

## 2019-10-22 DIAGNOSIS — R293 Abnormal posture: Secondary | ICD-10-CM

## 2019-10-22 NOTE — Patient Instructions (Signed)
Scar Massage  Scar massage is done to improve the mobility of scar, decrease scar tissue from building up, reduce adhesions, and prevent Keloids from forming. Start scar massage after scabs have fallen off by themselves and no open areas. The first few weeks after surgery, it is normal for a scar to appear pink or red and slightly raised. Scars can itch or have areas of numbness. Some scars may be sensitive.   Direct Scar massage: after scar is healed, no opening, no scab 1.  Place pads of two fingers together directly on the scar starting at one end of the scar. Move the fingers up and down across the scar holding 5 seconds one direction.  Then go opposite direction hold 5 seconds.  2. Move over to the next section of the scar and repeat.  Work your way along the entire length of the scar.   3. Next make diagonal movements along the scar holding 5 seconds at one direction. 4. Next movement is side to side. 5. Do not rub fingers over the scar.  Instead keep firm pressure and move scar over the tissue it is on top   Scar Lift and Roll 12 weeks after surgery. 1. Pinch a small amount of the scar between your first two fingers and thumb.  2. Roll the scar between your fingers for 5 to 15 seconds. 3. Move along the scar and repeat until you have massaged the entire length of scar.   Stop the massage and call your doctor if you notice: 1. Increased redness 2. Bleeding from scar 3. Seepage coming from the scar 4. Scar is warmer and has increased pain    

## 2019-10-22 NOTE — Therapy (Signed)
[place], [place], [address] Phone: [phone]   Fax:  [phone]  Physical Therapy Treatment  Patient Details  Name: [name] MRN: [id number] Date of Birth: January 18, 1951 Referring Provider (PT): Dr. Lindi Adie   Encounter Date: 10/22/2019   PT End of Session - 10/22/19 1636    Visit Number 2    Number of Visits 2    Date for PT Re-Evaluation 10/29/19    PT Start Time 1606    PT Stop Time 1633    PT Time Calculation (min) 27 min    Activity Tolerance Patient tolerated treatment well    Behavior During Therapy [facility] for tasks assessed/performed           Past Medical History:  Diagnosis Date  . Anxiety   . Arthritis   . Depression   . GERD (gastroesophageal reflux disease)   . Hernia    umbilical  . Hyperlipidemia   . Hypertension   . Sleep apnea     Past Surgical History:  Procedure Laterality Date  . ABDOMINAL HYSTERECTOMY     Partial  . BACK SURGERY     4 Bilateral Titanium Screws near L4 and L5  . BREAST LUMPECTOMY WITH RADIOACTIVE SEED AND SENTINEL LYMPH NODE BIOPSY Right 10/01/2019   Procedure: RIGHT BREAST LUMPECTOMY WITH RADIOACTIVE SEED AND SENTINEL LYMPH NODE BIOPSY;  Surgeon: Erroll Luna, MD;  Location: [facility];  Service: General;  Laterality: Right;  PEC BLOCK  . CHOLECYSTECTOMY    . EXCISION / CURETTAGE BONE CYST PHALANGES OF FOOT Right   . HEEL SPUR EXCISION Left   . SPINAL CORD STIMULATOR INSERTION  09/26/2012   Dr Rolena Infante  . SPINAL CORD STIMULATOR INSERTION N/A 09/26/2012   Procedure: SPINAL CORD STIMULATOR PLACEMENT;  Surgeon: Melina Schools, MD;  Location: [facility];  Service: Orthopedics;  Laterality: N/A;  . TONSILLECTOMY      There were no vitals filed for this visit.   Subjective Assessment - 10/22/19 1606    Subjective It was a breeze.  The armpit burns all the time.  I am not sure where to get the bras.    Pertinent History diagnosed with Rt  ER positive, PR/HER2 negative IDC with lumpectomy and SLNB on 10/01/19 with 0/7 lymph nodes removed.  other health history of HTN, anxiety, hysterectomy, spinal stimulator that was removed and pt reports only the leads are in place now    Patient Stated Goals get information for after surgery    Currently in Pain? No/denies   the arm pit is just sensitive             [facility] PT Assessment - 10/22/19 0001      AROM   Right Shoulder Extension 75 Degrees    Right Shoulder Flexion 153 Degrees    Right Shoulder ABduction 163 Degrees      Palpation   Palpation comment some fibrosis surrounding lumpectomy incision so pt was educated on scar massage for 4-6 weeks post op; general softness in the Rt axilla as well as the Lt              LYMPHEDEMA/ONCOLOGY QUESTIONNAIRE - 10/22/19 0001      Surgeries   Lumpectomy Date 10/01/19    Axillary Lymph Node Dissection Date 10/01/19    Number Lymph Nodes Removed 7      Right Upper Extremity Lymphedema   10 cm Proximal to Olecranon Process 32.7 cm    Olecranon  Process 27.5 cm    10 cm Proximal to Ulnar Styloid Process 22.4 cm    Just Proximal to Ulnar Styloid Process 16.6 cm              Quick Dash - 10/22/19 0001    Open a tight or new jar Mild difficulty    Do heavy household chores (wash walls, wash floors) Mild difficulty    Carry a shopping bag or briefcase No difficulty    Wash your back Moderate difficulty    Use a knife to cut food Mild difficulty    Recreational activities in which you take some force or impact through your arm, shoulder, or hand (golf, hammering, tennis) No difficulty    During the past week, to what extent has your arm, shoulder or hand problem interfered with your normal social activities with family, friends, neighbors, or groups? Not at all    During the past week, to what extent has your arm, shoulder or hand problem limited your work or other regular daily activities Not at all    Arm, shoulder, or hand  pain. Mild    Tingling (pins and needles) in your arm, shoulder, or hand None    Difficulty Sleeping No difficulty    DASH Score 13.64 %                          PT Education - 10/22/19 1635    Education Details where to get bras and prescription for this issued, watching for breast lymphedema and UE lymphedema, scar massage    Person(s) Educated Patient    Methods Explanation;Demonstration;Tactile cues;Verbal cues;Handout    Comprehension Verbalized understanding                      Plan - 10/22/19 1636    Clinical Impression Statement Pt returns 3 weeks post lumpectomy and SLNB frustrated becuase she is unaware of the plan for the rest of her treatment and the outcome of her surgery.  Pt reports she is awaiting a call back from Dr. Geralyn Flash office.  Pt has returned to baseline AROM without cording evident, no increase in UE circumferences and no evidence of breast edema.  Pt does have some lumpectomy incision fibrosis and some general axillary post op edema and sensitivity.  Pt was educated and given instruction on scar massage, where to get bras, how to usepillow or towel roll in the axilla and pt was issued a 1/2" gray foam rectangle with immediate relief.  No formal PT needed at this time and pt understands to return with any increased swelling or pain.    Consulted and Agree with Plan of Care Patient           Patient will benefit from skilled therapeutic intervention in order to improve the following deficits and impairments:     Visit Diagnosis: Abnormal posture  Malignant neoplasm of upper-outer quadrant of right breast in female, estrogen receptor positive ([place])     Problem List Patient Active Problem List   Diagnosis Date Noted  . Malignant neoplasm of upper-outer quadrant of right breast in female, estrogen receptor positive ([place]) 09/17/2019  . AKI (acute kidney injury) ([place]) 08/03/2014  . Nausea with vomiting 08/03/2014  . Dehydration  08/03/2014  . Bilateral flank pain 05/09/2012  . HTN (hypertension) 05/09/2012  . GERD (gastroesophageal reflux disease) 05/09/2012  . Adjustment disorder with mixed anxiety and depressed mood 05/09/2012    Quintina Hakeem,  Adrian Prince 10/22/2019, 4:41 PM  [place], [place], [address] Phone: [phone]   Fax:  [phone]  Name: [name] MRN: [id number] Date of Birth: [birth year]

## 2019-10-23 ENCOUNTER — Telehealth: Payer: Self-pay | Admitting: *Deleted

## 2019-10-23 NOTE — Telephone Encounter (Signed)
Received oncotype score of 68. Physician team notified.

## 2019-10-24 ENCOUNTER — Encounter: Payer: Self-pay | Admitting: *Deleted

## 2019-10-24 ENCOUNTER — Telehealth: Payer: Self-pay | Admitting: *Deleted

## 2019-10-24 NOTE — Telephone Encounter (Signed)
Called [name] to discuss oncotype score and f/u appt with Dr. Lindi Adie. Informed [name] oncotype score is 68. Scheduled and confirmed appt with Dr. Lindi Adie on 9/13/221. [name] request this day so her daughter can accompany her to the appts. Denies further needs or questions at this time.

## 2019-10-27 NOTE — Progress Notes (Signed)
Patient Care Team: Everardo Beals, NP as PCP - General Mauro Kaufmann, RN as Oncology Nurse Navigator Rockwell Germany, RN as Oncology Nurse Navigator  DIAGNOSIS:    ICD-10-CM   1. Malignant neoplasm of upper-outer quadrant of right breast in female, estrogen receptor positive ([place])  C50.411    Z17.0     SUMMARY OF ONCOLOGIC HISTORY: Oncology History  Malignant neoplasm of upper-outer quadrant of right breast in female, estrogen receptor positive ([place])  09/17/2019 Initial Diagnosis   Screening mammogram detected a right breast mass, not palpable on exam. Diagnostic mammogram showed 1.0cm mass at the 12:30 position in the right breast, with a mildly abnormal right axillary lymph node, 4.89m. Biopsy showed IDC in the breast, grade 3, HER-2 negative (1+), ER+ 70%, PR- 0%, Ki67 85%, and the lymph node negative for carcinoma.   09/24/2019 Cancer Staging   Staging form: Breast, AJCC 8th Edition - Clinical stage from 09/24/2019: Stage IB (cT1b, cN0(f), cM0, G3, ER+, PR-, HER2-) - Signed by GNicholas Lose MD on 10/08/2019   10/01/2019 Surgery   Right lumpectomy ([facility]): IDC, grade 3, 2.8cm, clear margins, 7 right axillary lymph nodes negative for carcinoma.   10/23/2019 Oncotype testing   Oncotype DX recurrence score 68: Greater than 39% risk of distant recurrence at 9 years     CHIEF COMPLIANT: Follow-up to discuss further treatment, review Oncotype results   INTERVAL HISTORY: [name] a 69y.o. with above-mentioned history of right breast cancer who underwent a right lumpectomy. Oncotype testing showed a score of 68. She presents to the clinic today to discuss adjuvant chemotherapy.  She uses a walker to get around.  Her daughter tells me that she is not very physically active.  ALLERGIES:  is allergic to ceftin [cefuroxime] and zofran [ondansetron].  MEDICATIONS:  Current Outpatient Medications  Medication Sig Dispense Refill  . ALPRAZolam (XANAX) 0.5 MG tablet Take  0.25-0.5 mg by mouth 2 (two) times daily as needed. For anxiety    . amLODipine-benazepril (LOTREL) 10-20 MG per capsule Take 1 capsule by mouth daily.    . cholecalciferol (VITAMIN D) 400 UNITS TABS Take 400 Units by mouth daily.    . eszopiclone (LUNESTA) 2 MG TABS Take 3 mg by mouth at bedtime as needed for sleep. Take immediately before bedtime    . hydrochlorothiazide (HYDRODIURIL) 25 MG tablet Take 25 mg by mouth daily.    .Marland KitchenHYDROcodone-acetaminophen (NORCO/VICODIN) 5-325 MG tablet Take 1 tablet by mouth every 6 (six) hours as needed for moderate pain. 15 tablet 0  . ibuprofen (ADVIL) 800 MG tablet Take 1 tablet (800 mg total) by mouth every 8 (eight) hours as needed. 30 tablet 0  . Omeprazole (PRILOSEC PO) Take by mouth.    . rosuvastatin (CRESTOR) 10 MG tablet     . sertraline (ZOLOFT) 100 MG tablet Take 200 mg by mouth daily.     No current facility-administered medications for this visit.    PHYSICAL EXAMINATION: ECOG PERFORMANCE STATUS: 2 - Symptomatic, <50% confined to bed  Vitals:   10/28/19 1113  BP: 133/78  Pulse: 87  Resp: 16  Temp: (!) 97.5 F (36.4 C)  SpO2: 99%   Filed Weights   10/28/19 1113  Weight: 218 lb 1.6 oz (98.9 kg)     LABORATORY DATA:  I have reviewed the data as listed CMP Latest Ref Rng & Units 09/27/2019 08/05/2014 08/04/2014  Glucose 70 - 99 mg/dL 100(H) 106(H) 115(H)  BUN 8 - 23 mg/dL  '18 11 20  ' Creatinine 0.44 - 1.00 mg/dL 0.78 0.83 1.14(H)  Sodium 135 - 145 mmol/L 138 138 136  Potassium 3.5 - 5.1 mmol/L 3.9 3.7 3.6  Chloride 98 - 111 mmol/L 106 106 106  CO2 22 - 32 mmol/L 23 20(L) 23  Calcium 8.9 - 10.3 mg/dL 9.1 8.7(L) 8.2(L)  Total Protein 6.5 - 8.1 g/dL 7.6 - -  Total Bilirubin 0.3 - 1.2 mg/dL 0.2(L) - -  Alkaline Phos 38 - 126 U/L 96 - -  AST 15 - 41 U/L 43(H) - -  ALT 0 - 44 U/L 32 - -    Lab Results  Component Value Date   WBC 6.5 09/27/2019   HGB 10.5 (L) 09/27/2019   HCT 34.7 (L) 09/27/2019   MCV 82.6 09/27/2019   PLT  250 09/27/2019   NEUTROABS 3.1 09/27/2019    ASSESSMENT & PLAN:  Malignant neoplasm of upper-outer quadrant of right breast in female, estrogen receptor positive ([place]) 10/01/2019:Right lumpectomy ([facility]): IDC, grade 3, 2.8cm, clear margins, 7 right axillary lymph nodes negative for carcinoma.  ER 90%, PR 0%, Ki-67 85%, HER-2 negative T2N0 stage Ib Oncotype DX recurrence score 68, greater than 39% risk of distant recurrence at 9 years  Treatment plan: 1. Adjuvant chemotherapy with CMF 2. Adjuvant radiation therapy followed by 3. Adjuvant antiestrogen therapy  Chemotherapy Counseling: I discussed the risks and benefits of chemotherapy including the risks of nausea/ vomiting, risk of infection from low WBC count, fatigue due to chemo or anemia, bruising or bleeding due to low platelets, mouth sores, loss/ change in taste and decreased appetite. Liver and kidney function will be monitored through out chemotherapy as abnormalities in liver and kidney function may be a side effect of treatment. Risk of permanent bone marrow dysfunction and leukemia due to chemo were also discussed.   Chronic back issues: Had seen orthopedics in the past and had scans of all kinds done before. CT of the chest abdomen and pelvis: 09/30/2019: Right breast lesion, internal mammary lymph nodes, upper abdominal nodal enlargement (probably related to liver cirrhosis) small lung nodules No clear-cut evidence of metastatic disease.  Return to clinic based in 2 weeks to start chemo.  I requested Dr. Brantley Stage to place a port.    No orders of the defined types were placed in this encounter.  The patient has a good understanding of the overall plan. she agrees with it. she will call with any problems that may develop before the next visit here.  Total time spent: 30 mins including face to face time and time spent for planning, charting and coordination of care  Nicholas Lose, MD 10/28/2019  I, Cloyde Reams Dorshimer, am  acting as scribe for Dr. Nicholas Lose.  I have reviewed the above documentation for accuracy and completeness, and I agree with the above.

## 2019-10-28 ENCOUNTER — Inpatient Hospital Stay: Payer: Medicare PPO | Attending: Hematology and Oncology | Admitting: Hematology and Oncology

## 2019-10-28 ENCOUNTER — Encounter (HOSPITAL_COMMUNITY): Payer: Self-pay | Admitting: Hematology and Oncology

## 2019-10-28 ENCOUNTER — Ambulatory Visit: Payer: Self-pay | Admitting: Surgery

## 2019-10-28 ENCOUNTER — Encounter: Payer: Self-pay | Admitting: *Deleted

## 2019-10-28 ENCOUNTER — Other Ambulatory Visit: Payer: Self-pay

## 2019-10-28 VITALS — BP 133/78 | HR 87 | Temp 97.5°F | Resp 16 | Ht 63.0 in | Wt 218.1 lb

## 2019-10-28 DIAGNOSIS — Z888 Allergy status to other drugs, medicaments and biological substances status: Secondary | ICD-10-CM | POA: Insufficient documentation

## 2019-10-28 DIAGNOSIS — Z5111 Encounter for antineoplastic chemotherapy: Secondary | ICD-10-CM | POA: Insufficient documentation

## 2019-10-28 DIAGNOSIS — Z79899 Other long term (current) drug therapy: Secondary | ICD-10-CM | POA: Insufficient documentation

## 2019-10-28 DIAGNOSIS — Z881 Allergy status to other antibiotic agents status: Secondary | ICD-10-CM | POA: Diagnosis not present

## 2019-10-28 DIAGNOSIS — C50411 Malignant neoplasm of upper-outer quadrant of right female breast: Secondary | ICD-10-CM | POA: Diagnosis present

## 2019-10-28 DIAGNOSIS — Z170 Estrogen receptor positive status [ER+]: Secondary | ICD-10-CM | POA: Insufficient documentation

## 2019-10-28 DIAGNOSIS — N6312 Unspecified lump in the right breast, upper inner quadrant: Secondary | ICD-10-CM | POA: Diagnosis not present

## 2019-10-28 MED ORDER — PROCHLORPERAZINE MALEATE 10 MG PO TABS: 10.0000 mg | ORAL_TABLET | Freq: Four times a day (QID) | ORAL | 1 refills | Status: DC | PRN

## 2019-10-28 MED ORDER — LIDOCAINE-PRILOCAINE 2.5-2.5 % EX CREA: TOPICAL_CREAM | CUTANEOUS | 3 refills | Status: DC

## 2019-10-28 NOTE — Progress Notes (Signed)
START OFF PATHWAY REGIMEN - Breast   [id number]:CMF (IV cyclophosphamide) q21 days:   A cycle is every 21 days:     Cyclophosphamide      Methotrexate      Fluorouracil   **Always confirm dose/schedule in your pharmacy ordering system**  Patient Characteristics: Preoperative or Nonsurgical Candidate (Clinical Staging), Neoadjuvant Therapy followed by Surgery, Invasive Disease, Chemotherapy, HER2 Negative/Unknown/Equivocal, ER Positive Therapeutic Status: Preoperative or Nonsurgical Candidate (Clinical Staging) AJCC M Category: cM0 AJCC Grade: G3 Breast Surgical Plan: Neoadjuvant Therapy followed by Surgery ER Status: Positive (+) AJCC 8 Stage Grouping: IIIA HER2 Status: Negative (-) AJCC T Category: cT2 AJCC N Category: cN1 PR Status: Negative (-) Intent of Therapy: Curative Intent, Discussed with Patient

## 2019-10-28 NOTE — Assessment & Plan Note (Addendum)
10/01/2019:Right lumpectomy ([facility]): IDC, grade 3, 2.8cm, clear margins, 7 right axillary lymph nodes negative for carcinoma.  ER 90%, PR 0%, Ki-67 85%, HER-2 negative T2N0 stage Ib Oncotype DX recurrence score 68, greater than 39% risk of distant recurrence at 9 years  Treatment plan: 1. Adjuvant chemotherapy with Dose dense AC followed by Taxol 2. Adjuvant radiation therapy followed by 3. Adjuvant antiestrogen therapy  Chemotherapy Counseling: I discussed the risks and benefits of chemotherapy including the risks of nausea/ vomiting, risk of infection from low WBC count, fatigue due to chemo or anemia, bruising or bleeding due to low platelets, mouth sores, loss/ change in taste and decreased appetite. Liver and kidney function will be monitored through out chemotherapy as abnormalities in liver and kidney function may be a side effect of treatment. Cardiac dysfunction due to Adriamycin and neuropathy risk of Taxol were discussed in detail. Risk of permanent bone marrow dysfunction and leukemia due to chemo were also discussed.   Chronic back issues: Had seen orthopedics in the past and had scans of all kinds done before. CT of the chest abdomen and pelvis: 09/30/2019: Right breast lesion, internal mammary lymph nodes, upper abdominal nodal enlargement (probably related to liver cirrhosis) small lung nodules No clear-cut evidence of metastatic disease.  Return to clinic based in 2 weeks to start chemo

## 2019-10-29 ENCOUNTER — Telehealth: Payer: Self-pay | Admitting: Hematology and Oncology

## 2019-10-29 NOTE — Telephone Encounter (Signed)
Scheduled per 9/13 los. Called and left a msg, mailing appt letter and calendar printout

## 2019-10-31 ENCOUNTER — Encounter: Payer: Self-pay | Admitting: *Deleted

## 2019-11-01 ENCOUNTER — Telehealth: Payer: Self-pay | Admitting: Hematology and Oncology

## 2019-11-01 ENCOUNTER — Encounter: Payer: Self-pay | Admitting: Hematology and Oncology

## 2019-11-01 NOTE — Telephone Encounter (Signed)
Scheduled appt per 9/16 sch msg - pt to get an updated schedule on 9/20 at chemo edu

## 2019-11-04 ENCOUNTER — Other Ambulatory Visit: Payer: Self-pay

## 2019-11-04 ENCOUNTER — Encounter: Payer: Self-pay | Admitting: *Deleted

## 2019-11-04 ENCOUNTER — Inpatient Hospital Stay: Payer: Medicare PPO

## 2019-11-05 NOTE — Progress Notes (Signed)
11/05/19  Udenyca not approved by [facility] without peer to peer.  [employer] is requiring P2P for [employer].   Reason:  Other reason: - Breast cancer. Adjuvant CMF. Low risk for Febrile neutropenia. [place] 3100 on 09/27/19. Will need P2P. No NCD. Unable to approve per [place], [place], [place] [id number].   P2P can be done by calling Dr. Duke Salvia phone# [phone]   ID# [id number]  DOB 1950-08-01    Dr Lindi Adie wants to:  Lets try to treat without udenyca.  If she gets neutropenic we can try to get it.  Udenyca day 3 of each cycle has been deleted.  Henreitta Leber, PharmD

## 2019-11-06 ENCOUNTER — Other Ambulatory Visit: Payer: Self-pay

## 2019-11-06 ENCOUNTER — Encounter (HOSPITAL_BASED_OUTPATIENT_CLINIC_OR_DEPARTMENT_OTHER): Payer: Self-pay | Admitting: Surgery

## 2019-11-07 NOTE — Progress Notes (Signed)
Pharmacist Chemotherapy Monitoring - Initial Assessment    Anticipated start date: 11/13/19   Regimen:  . Are orders appropriate based on the patient's diagnosis, regimen, and cycle? Yes . Does the plan date match the patient's scheduled date? Yes . Is the sequencing of drugs appropriate? Yes . Are the premedications appropriate for the patient's regimen? Yes . Prior Authorization for treatment is: Pending o If applicable, is the correct biosimilar selected based on the patient's insurance? not applicable  Organ Function and Labs: Marland Kitchen Are dose adjustments needed based on the patient's renal function, hepatic function, or hematologic function? No . Are appropriate labs ordered prior to the start of patient's treatment? Yes . Other organ system assessment, if indicated: N/A . The following baseline labs, if indicated, have been ordered: N/A  Dose Assessment: . Are the drug doses appropriate? Yes . Are the following correct: o Drug concentrations Yes o IV fluid compatible with drug Yes o Administration routes Yes o Timing of therapy Yes . If applicable, does the patient have documented access for treatment and/or plans for port-a-cath placement? not applicable . If applicable, have lifetime cumulative doses been properly documented and assessed? not applicable Lifetime Dose Tracking  No doses have been documented on this patient for the following tracked chemicals: Doxorubicin, Epirubicin, Idarubicin, Daunorubicin, Mitoxantrone, Bleomycin, Oxaliplatin, Carboplatin, Liposomal Doxorubicin  o   Toxicity Monitoring/Prevention: . The patient has the following take home antiemetics prescribed: Prochlorperazine . The patient has the following take home medications prescribed: N/A . Medication allergies and previous infusion related reactions, if applicable, have been reviewed and addressed. Yes . The patient's current medication list has been assessed for drug-drug interactions with their  chemotherapy regimen. no significant drug-drug interactions were identified on review.  Order Review: . Are the treatment plan orders signed? Yes . Is the patient scheduled to see a provider prior to their treatment? Yes  I verify that I have reviewed each item in the above checklist and answered each question accordingly.  Wileen Duncanson K 11/07/2019 12:11 PM

## 2019-11-08 ENCOUNTER — Encounter (HOSPITAL_BASED_OUTPATIENT_CLINIC_OR_DEPARTMENT_OTHER)
Admission: RE | Admit: 2019-11-08 | Discharge: 2019-11-08 | Disposition: A | Discharge status: Home / Self Care | Payer: Medicare PPO | Source: Ambulatory Visit | Attending: Surgery | Admitting: Surgery

## 2019-11-08 ENCOUNTER — Other Ambulatory Visit (HOSPITAL_COMMUNITY)
Admission: RE | Admit: 2019-11-08 | Discharge: 2019-11-08 | Disposition: A | Discharge status: Home / Self Care | Payer: Medicare PPO | Source: Ambulatory Visit | Attending: Surgery | Admitting: Surgery

## 2019-11-08 DIAGNOSIS — E785 Hyperlipidemia, unspecified: Secondary | ICD-10-CM | POA: Diagnosis not present

## 2019-11-08 DIAGNOSIS — Z20822 Contact with and (suspected) exposure to covid-19: Secondary | ICD-10-CM | POA: Diagnosis not present

## 2019-11-08 DIAGNOSIS — Z01812 Encounter for preprocedural laboratory examination: Secondary | ICD-10-CM | POA: Insufficient documentation

## 2019-11-08 DIAGNOSIS — Z87891 Personal history of nicotine dependence: Secondary | ICD-10-CM | POA: Diagnosis not present

## 2019-11-08 DIAGNOSIS — C50911 Malignant neoplasm of unspecified site of right female breast: Secondary | ICD-10-CM | POA: Diagnosis not present

## 2019-11-08 DIAGNOSIS — K219 Gastro-esophageal reflux disease without esophagitis: Secondary | ICD-10-CM | POA: Diagnosis not present

## 2019-11-08 DIAGNOSIS — Z888 Allergy status to other drugs, medicaments and biological substances status: Secondary | ICD-10-CM | POA: Diagnosis not present

## 2019-11-08 DIAGNOSIS — F329 Major depressive disorder, single episode, unspecified: Secondary | ICD-10-CM | POA: Diagnosis not present

## 2019-11-08 DIAGNOSIS — M1990 Unspecified osteoarthritis, unspecified site: Secondary | ICD-10-CM | POA: Diagnosis not present

## 2019-11-08 DIAGNOSIS — Z452 Encounter for adjustment and management of vascular access device: Secondary | ICD-10-CM | POA: Diagnosis present

## 2019-11-08 DIAGNOSIS — F419 Anxiety disorder, unspecified: Secondary | ICD-10-CM | POA: Diagnosis not present

## 2019-11-08 DIAGNOSIS — G4730 Sleep apnea, unspecified: Secondary | ICD-10-CM | POA: Diagnosis not present

## 2019-11-08 DIAGNOSIS — Z79899 Other long term (current) drug therapy: Secondary | ICD-10-CM | POA: Diagnosis not present

## 2019-11-08 DIAGNOSIS — Z6838 Body mass index (BMI) 38.0-38.9, adult: Secondary | ICD-10-CM | POA: Diagnosis not present

## 2019-11-08 DIAGNOSIS — I10 Essential (primary) hypertension: Secondary | ICD-10-CM | POA: Diagnosis not present

## 2019-11-08 DIAGNOSIS — Z881 Allergy status to other antibiotic agents status: Secondary | ICD-10-CM | POA: Diagnosis not present

## 2019-11-08 LAB — CBC WITH DIFFERENTIAL/PLATELET
Abs Immature Granulocytes: 0.02 10*3/uL (ref 0.00–0.07)
Basophils Absolute: 0 10*3/uL (ref 0.0–0.1)
Basophils Relative: 0 %
Eosinophils Absolute: 0.2 10*3/uL (ref 0.0–0.5)
Eosinophils Relative: 4 %
HCT: 37 % (ref 36.0–46.0)
Hemoglobin: 11.1 g/dL — ABNORMAL LOW (ref 12.0–15.0)
Immature Granulocytes: 0 %
Lymphocytes Relative: 29 %
Lymphs Abs: 1.9 10*3/uL (ref 0.7–4.0)
MCH: 25.3 pg — ABNORMAL LOW (ref 26.0–34.0)
MCHC: 30 g/dL (ref 30.0–36.0)
MCV: 84.3 fL (ref 80.0–100.0)
Monocytes Absolute: 0.6 10*3/uL (ref 0.1–1.0)
Monocytes Relative: 9 %
Neutro Abs: 3.9 10*3/uL (ref 1.7–7.7)
Neutrophils Relative %: 58 %
Platelets: 255 10*3/uL (ref 150–400)
RBC: 4.39 MIL/uL (ref 3.87–5.11)
RDW: 18.5 % — ABNORMAL HIGH (ref 11.5–15.5)
WBC: 6.8 10*3/uL (ref 4.0–10.5)
nRBC: 0 % (ref 0.0–0.2)

## 2019-11-08 LAB — COMPREHENSIVE METABOLIC PANEL
ALT: 22 U/L (ref 0–44)
AST: 35 U/L (ref 15–41)
Albumin: 4 g/dL (ref 3.5–5.0)
Alkaline Phosphatase: 101 U/L (ref 38–126)
Anion gap: 9 (ref 5–15)
BUN: 14 mg/dL (ref 8–23)
CO2: 29 mmol/L (ref 22–32)
Calcium: 9.5 mg/dL (ref 8.9–10.3)
Chloride: 102 mmol/L (ref 98–111)
Creatinine, Ser: 0.99 mg/dL (ref 0.44–1.00)
GFR calc Af Amer: >60 mL/min (ref >60)
GFR calc non Af Amer: 58 mL/min — ABNORMAL LOW (ref >60)
Glucose, Bld: 105 mg/dL — ABNORMAL HIGH (ref 70–99)
Potassium: 4 mmol/L (ref 3.5–5.1)
Sodium: 140 mmol/L (ref 135–145)
Total Bilirubin: 0.8 mg/dL (ref 0.3–1.2)
Total Protein: 7.9 g/dL (ref 6.5–8.1)

## 2019-11-08 LAB — SARS CORONAVIRUS 2 (TAT 6-24 HRS): SARS Coronavirus 2: NEGATIVE

## 2019-11-08 MED ORDER — CHLORHEXIDINE GLUCONATE CLOTH 2 % EX PADS: 6.0000 | MEDICATED_PAD | Freq: Once | CUTANEOUS | Status: DC

## 2019-11-08 NOTE — Progress Notes (Signed)
      Enhanced Recovery after Surgery for Orthopedics Enhanced Recovery after Surgery is a protocol used to improve the stress on your body and your recovery after surgery.  Patient Instructions  . The night before surgery:  o No food after midnight. ONLY clear liquids after midnight  . The day of surgery (if you do NOT have diabetes):  o Drink ONE (1) Pre-Surgery Clear Ensure as directed.   o This drink was given to you during your hospital  pre-op appointment visit. o The pre-op nurse will instruct you on the time to drink the  Pre-Surgery Ensure depending on your surgery time. o Finish the drink at the designated time by the pre-op nurse.  o Nothing else to drink after completing the  Pre-Surgery Clear Ensure.  . The day of surgery (if you have diabetes): o Drink ONE (1) Gatorade 2 (G2) as directed. o This drink was given to you during your hospital  pre-op appointment visit.  o The pre-op nurse will instruct you on the time to drink the   Gatorade 2 (G2) depending on your surgery time. o Color of the Gatorade may vary. Red is not allowed. o Nothing else to drink after completing the  Gatorade 2 (G2).   Patient given CHG pre-surgical soap. Patient verbalized understanding.         If you have questions, please contact your surgeon's office.

## 2019-11-12 ENCOUNTER — Other Ambulatory Visit: Payer: Self-pay

## 2019-11-12 ENCOUNTER — Ambulatory Visit (HOSPITAL_COMMUNITY): Payer: Medicare PPO

## 2019-11-12 ENCOUNTER — Ambulatory Visit (HOSPITAL_BASED_OUTPATIENT_CLINIC_OR_DEPARTMENT_OTHER)
Admission: RE | Admit: 2019-11-12 | Discharge: 2019-11-12 | Disposition: A | Discharge status: Home / Self Care | Payer: Medicare PPO | Attending: Surgery | Admitting: Surgery

## 2019-11-12 ENCOUNTER — Encounter (HOSPITAL_BASED_OUTPATIENT_CLINIC_OR_DEPARTMENT_OTHER): Payer: Self-pay | Admitting: Surgery

## 2019-11-12 ENCOUNTER — Ambulatory Visit (HOSPITAL_BASED_OUTPATIENT_CLINIC_OR_DEPARTMENT_OTHER): Payer: Medicare PPO | Admitting: Anesthesiology

## 2019-11-12 ENCOUNTER — Encounter (HOSPITAL_BASED_OUTPATIENT_CLINIC_OR_DEPARTMENT_OTHER)
Admission: RE | Disposition: A | Discharge status: Home / Self Care | Payer: Self-pay | Source: Home / Self Care | Attending: Surgery

## 2019-11-12 DIAGNOSIS — C50911 Malignant neoplasm of unspecified site of right female breast: Secondary | ICD-10-CM | POA: Insufficient documentation

## 2019-11-12 DIAGNOSIS — Z6838 Body mass index (BMI) 38.0-38.9, adult: Secondary | ICD-10-CM | POA: Insufficient documentation

## 2019-11-12 DIAGNOSIS — F329 Major depressive disorder, single episode, unspecified: Secondary | ICD-10-CM | POA: Insufficient documentation

## 2019-11-12 DIAGNOSIS — Z79899 Other long term (current) drug therapy: Secondary | ICD-10-CM | POA: Insufficient documentation

## 2019-11-12 DIAGNOSIS — E785 Hyperlipidemia, unspecified: Secondary | ICD-10-CM | POA: Insufficient documentation

## 2019-11-12 DIAGNOSIS — M1990 Unspecified osteoarthritis, unspecified site: Secondary | ICD-10-CM | POA: Insufficient documentation

## 2019-11-12 DIAGNOSIS — Z888 Allergy status to other drugs, medicaments and biological substances status: Secondary | ICD-10-CM | POA: Insufficient documentation

## 2019-11-12 DIAGNOSIS — F419 Anxiety disorder, unspecified: Secondary | ICD-10-CM | POA: Diagnosis not present

## 2019-11-12 DIAGNOSIS — G4730 Sleep apnea, unspecified: Secondary | ICD-10-CM | POA: Insufficient documentation

## 2019-11-12 DIAGNOSIS — Z452 Encounter for adjustment and management of vascular access device: Secondary | ICD-10-CM | POA: Diagnosis not present

## 2019-11-12 DIAGNOSIS — Z87891 Personal history of nicotine dependence: Secondary | ICD-10-CM | POA: Insufficient documentation

## 2019-11-12 DIAGNOSIS — Z95828 Presence of other vascular implants and grafts: Secondary | ICD-10-CM

## 2019-11-12 DIAGNOSIS — K219 Gastro-esophageal reflux disease without esophagitis: Secondary | ICD-10-CM | POA: Insufficient documentation

## 2019-11-12 DIAGNOSIS — Z881 Allergy status to other antibiotic agents status: Secondary | ICD-10-CM | POA: Insufficient documentation

## 2019-11-12 DIAGNOSIS — I10 Essential (primary) hypertension: Secondary | ICD-10-CM | POA: Insufficient documentation

## 2019-11-12 HISTORY — PX: PORTACATH PLACEMENT: SHX2246

## 2019-11-12 SURGERY — INSERTION, TUNNELED CENTRAL VENOUS DEVICE, WITH PORT
Anesthesia: General | Site: Neck | Laterality: Right

## 2019-11-12 MED ORDER — LIDOCAINE HCL (CARDIAC) PF 100 MG/5ML IV SOSY
PREFILLED_SYRINGE | INTRAVENOUS | Status: DC | PRN
  Administered 2019-11-12: 50 mg via INTRAVENOUS

## 2019-11-12 MED ORDER — HEPARIN SOD (PORK) LOCK FLUSH 100 UNIT/ML IV SOLN
INTRAVENOUS | Status: DC | PRN
  Administered 2019-11-12: 500 [IU] via INTRAVENOUS

## 2019-11-12 MED ORDER — FENTANYL CITRATE (PF) 100 MCG/2ML IJ SOLN
INTRAMUSCULAR | Status: AC
  Filled 2019-11-12: qty 2

## 2019-11-12 MED ORDER — CEFAZOLIN SODIUM-DEXTROSE 2-4 GM/100ML-% IV SOLN: 2.0000 g | INTRAVENOUS | Status: DC

## 2019-11-12 MED ORDER — OXYCODONE HCL 5 MG/5ML PO SOLN: 5.0000 mg | Freq: Once | ORAL | Status: DC | PRN

## 2019-11-12 MED ORDER — MIDAZOLAM HCL 2 MG/2ML IJ SOLN
INTRAMUSCULAR | Status: AC
  Filled 2019-11-12: qty 2

## 2019-11-12 MED ORDER — AMISULPRIDE (ANTIEMETIC) 5 MG/2ML IV SOLN: 10.0000 mg | Freq: Once | INTRAVENOUS | Status: DC | PRN

## 2019-11-12 MED ORDER — DROPERIDOL 2.5 MG/ML IJ SOLN
INTRAMUSCULAR | Status: DC | PRN
  Administered 2019-11-12: .625 mg via INTRAVENOUS

## 2019-11-12 MED ORDER — FENTANYL CITRATE (PF) 100 MCG/2ML IJ SOLN
INTRAMUSCULAR | Status: DC | PRN
Start: 2019-11-12 — End: 2019-11-12
  Administered 2019-11-12 (×2): 50 ug via INTRAVENOUS

## 2019-11-12 MED ORDER — LACTATED RINGERS IV SOLN: INTRAVENOUS | Status: DC

## 2019-11-12 MED ORDER — CLINDAMYCIN PHOSPHATE 900 MG/50ML IV SOLN
900.0000 mg | INTRAVENOUS | Status: AC
  Administered 2019-11-12: 900 mg via INTRAVENOUS

## 2019-11-12 MED ORDER — EPHEDRINE 5 MG/ML INJ
INTRAVENOUS | Status: AC
  Filled 2019-11-12: qty 10

## 2019-11-12 MED ORDER — HEPARIN (PORCINE) IN NACL 2-0.9 UNITS/ML
INTRAMUSCULAR | Status: AC | PRN
  Administered 2019-11-12: 500 mL via INTRAVENOUS

## 2019-11-12 MED ORDER — CLINDAMYCIN PHOSPHATE 900 MG/50ML IV SOLN
INTRAVENOUS | Status: AC
  Filled 2019-11-12: qty 50

## 2019-11-12 MED ORDER — OXYCODONE HCL 5 MG PO TABS: 5.0000 mg | ORAL_TABLET | Freq: Four times a day (QID) | ORAL | 0 refills | Status: DC | PRN

## 2019-11-12 MED ORDER — GLYCOPYRROLATE PF 0.2 MG/ML IJ SOSY
PREFILLED_SYRINGE | INTRAMUSCULAR | Status: AC
  Filled 2019-11-12: qty 1

## 2019-11-12 MED ORDER — PROPOFOL 10 MG/ML IV BOLUS
INTRAVENOUS | Status: DC | PRN
  Administered 2019-11-12: 200 mg via INTRAVENOUS

## 2019-11-12 MED ORDER — DROPERIDOL 2.5 MG/ML IJ SOLN
INTRAMUSCULAR | Status: AC
  Filled 2019-11-12: qty 2

## 2019-11-12 MED ORDER — HYDROMORPHONE HCL 1 MG/ML IJ SOLN: 0.2500 mg | INTRAMUSCULAR | Status: DC | PRN

## 2019-11-12 MED ORDER — DEXAMETHASONE SODIUM PHOSPHATE 4 MG/ML IJ SOLN
INTRAMUSCULAR | Status: DC | PRN
  Administered 2019-11-12: 5 mg via INTRAVENOUS

## 2019-11-12 MED ORDER — MEPERIDINE HCL 25 MG/ML IJ SOLN: 6.2500 mg | INTRAMUSCULAR | Status: DC | PRN

## 2019-11-12 MED ORDER — PROMETHAZINE HCL 25 MG/ML IJ SOLN: 6.2500 mg | INTRAMUSCULAR | Status: DC | PRN

## 2019-11-12 MED ORDER — OXYCODONE HCL 5 MG PO TABS: 5.0000 mg | ORAL_TABLET | Freq: Once | ORAL | Status: DC | PRN

## 2019-11-12 MED ORDER — EPHEDRINE SULFATE 50 MG/ML IJ SOLN
INTRAMUSCULAR | Status: DC | PRN
  Administered 2019-11-12 (×5): 10 mg via INTRAVENOUS

## 2019-11-12 MED ORDER — IBUPROFEN 800 MG PO TABS: 800.0000 mg | ORAL_TABLET | Freq: Three times a day (TID) | ORAL | 0 refills | Status: DC | PRN

## 2019-11-12 MED ORDER — BUPIVACAINE HCL (PF) 0.25 % IJ SOLN
INTRAMUSCULAR | Status: DC | PRN
  Administered 2019-11-12: 10 mL

## 2019-11-12 MED ORDER — MIDAZOLAM HCL 5 MG/5ML IJ SOLN
INTRAMUSCULAR | Status: DC | PRN
  Administered 2019-11-12: 2 mg via INTRAVENOUS

## 2019-11-12 MED ORDER — GLYCOPYRROLATE 0.2 MG/ML IJ SOLN
INTRAMUSCULAR | Status: DC | PRN
  Administered 2019-11-12: .2 mg via INTRAVENOUS

## 2019-11-12 SURGICAL SUPPLY — 62 items
ADH SKN CLS APL DERMABOND .7 (GAUZE/BANDAGES/DRESSINGS) ×1
APL PRP STRL LF DISP 70% ISPRP (MISCELLANEOUS) ×1
APL SKNCLS STERI-STRIP NONHPOA (GAUZE/BANDAGES/DRESSINGS)
BAG DECANTER FOR FLEXI CONT (MISCELLANEOUS) ×2 IMPLANT
BENZOIN TINCTURE PRP APPL 2/3 (GAUZE/BANDAGES/DRESSINGS) IMPLANT
BLADE HEX COATED 2.75 (ELECTRODE) ×2 IMPLANT
BLADE SURG 11 STRL SS (BLADE) ×2 IMPLANT
BLADE SURG 15 STRL LF DISP TIS (BLADE) ×1 IMPLANT
BLADE SURG 15 STRL SS (BLADE) ×2
CANISTER SUCT 1200ML W/VALVE (MISCELLANEOUS) IMPLANT
CHLORAPREP W/TINT 26 (MISCELLANEOUS) ×2 IMPLANT
COVER BACK TABLE 60X90IN (DRAPES) ×2 IMPLANT
COVER MAYO STAND STRL (DRAPES) ×2 IMPLANT
COVER PROBE 5X48 (MISCELLANEOUS) ×2
COVER WAND RF STERILE (DRAPES) IMPLANT
DECANTER SPIKE VIAL GLASS SM (MISCELLANEOUS) IMPLANT
DERMABOND ADVANCED (GAUZE/BANDAGES/DRESSINGS) ×1
DERMABOND ADVANCED .7 DNX12 (GAUZE/BANDAGES/DRESSINGS) ×1 IMPLANT
DRAPE C-ARM 42X72 X-RAY (DRAPES) ×2 IMPLANT
DRAPE LAPAROSCOPIC ABDOMINAL (DRAPES) ×2 IMPLANT
DRAPE UTILITY XL STRL (DRAPES) ×2 IMPLANT
DRSG TEGADERM 2-3/8X2-3/4 SM (GAUZE/BANDAGES/DRESSINGS) ×2 IMPLANT
DRSG TEGADERM 4X4.75 (GAUZE/BANDAGES/DRESSINGS) ×1 IMPLANT
ELECT REM PT RETURN 9FT ADLT (ELECTROSURGICAL) ×2
ELECTRODE REM PT RTRN 9FT ADLT (ELECTROSURGICAL) ×1 IMPLANT
GAUZE SPONGE 4X4 12PLY STRL LF (GAUZE/BANDAGES/DRESSINGS) IMPLANT
GLOVE BIOGEL PI IND STRL 7.0 (GLOVE) IMPLANT
GLOVE BIOGEL PI IND STRL 8 (GLOVE) ×1 IMPLANT
GLOVE BIOGEL PI INDICATOR 7.0 (GLOVE) ×2
GLOVE BIOGEL PI INDICATOR 8 (GLOVE) ×1
GLOVE ECLIPSE 6.5 STRL STRAW (GLOVE) ×1 IMPLANT
GLOVE ECLIPSE 8.0 STRL XLNG CF (GLOVE) ×2 IMPLANT
GOWN STRL REUS W/ TWL LRG LVL3 (GOWN DISPOSABLE) ×2 IMPLANT
GOWN STRL REUS W/TWL LRG LVL3 (GOWN DISPOSABLE) ×4
IV KIT MINILOC 20X1 SAFETY (NEEDLE) IMPLANT
KIT CVR 48X5XPRB PLUP LF (MISCELLANEOUS) ×1 IMPLANT
KIT PORT POWER 8FR ISP CVUE (Port) ×1 IMPLANT
NDL HYPO 25X1 1.5 SAFETY (NEEDLE) ×1 IMPLANT
NDL SAFETY ECLIPSE 18X1.5 (NEEDLE) IMPLANT
NDL SPNL 22GX3.5 QUINCKE BK (NEEDLE) IMPLANT
NEEDLE HYPO 18GX1.5 SHARP (NEEDLE)
NEEDLE HYPO 22GX1.5 SAFETY (NEEDLE) IMPLANT
NEEDLE HYPO 25X1 1.5 SAFETY (NEEDLE) ×2 IMPLANT
NEEDLE SPNL 22GX3.5 QUINCKE BK (NEEDLE) IMPLANT
PACK BASIN DAY SURGERY FS (CUSTOM PROCEDURE TRAY) ×2 IMPLANT
PENCIL SMOKE EVACUATOR (MISCELLANEOUS) ×2 IMPLANT
SET SHEATH INTRODUCER 10FR (MISCELLANEOUS) IMPLANT
SHEATH COOK PEEL AWAY SET 9F (SHEATH) IMPLANT
SLEEVE SCD COMPRESS KNEE MED (MISCELLANEOUS) ×2 IMPLANT
SPONGE LAP 4X18 RFD (DISPOSABLE) IMPLANT
STRIP CLOSURE SKIN 1/2X4 (GAUZE/BANDAGES/DRESSINGS) IMPLANT
SUT MON AB 4-0 PC3 18 (SUTURE) ×2 IMPLANT
SUT PROLENE 2 0 CT2 30 (SUTURE) IMPLANT
SUT PROLENE 2 0 SH DA (SUTURE) ×2 IMPLANT
SUT SILK 2 0 TIES 17X18 (SUTURE)
SUT SILK 2-0 18XBRD TIE BLK (SUTURE) IMPLANT
SUT VICRYL 3-0 CR8 SH (SUTURE) ×2 IMPLANT
SYR 5ML LUER SLIP (SYRINGE) ×2 IMPLANT
SYR CONTROL 10ML LL (SYRINGE) ×2 IMPLANT
TOWEL GREEN STERILE FF (TOWEL DISPOSABLE) ×4 IMPLANT
TUBE CONNECTING 20X1/4 (TUBING) IMPLANT
YANKAUER SUCT BULB TIP NO VENT (SUCTIONS) IMPLANT

## 2019-11-12 NOTE — H&P (Signed)
[name] is an 69 y.o. female.   Chief Complaint: breast cancer need a port for chemotherapy  HPI: Pt presents for port placement for chemotherapy  Past Medical History:  Diagnosis Date   Anxiety    Arthritis    Depression    GERD (gastroesophageal reflux disease)    Hernia    umbilical   Hyperlipidemia    Hypertension    Sleep apnea     Past Surgical History:  Procedure Laterality Date   ABDOMINAL HYSTERECTOMY     Partial   BACK SURGERY     4 Bilateral Titanium Screws near L4 and L5   BREAST LUMPECTOMY WITH RADIOACTIVE SEED AND SENTINEL LYMPH NODE BIOPSY Right 10/01/2019   Procedure: RIGHT BREAST LUMPECTOMY WITH RADIOACTIVE SEED AND SENTINEL LYMPH NODE BIOPSY;  Surgeon: Erroll Luna, MD;  Location: [facility];  Service: General;  Laterality: Right;  PEC BLOCK   CHOLECYSTECTOMY     EXCISION / CURETTAGE BONE CYST PHALANGES OF FOOT Right    HEEL SPUR EXCISION Left    SPINAL CORD STIMULATOR INSERTION  09/26/2012   Dr Rolena Infante   SPINAL CORD STIMULATOR INSERTION N/A 09/26/2012   Procedure: SPINAL CORD STIMULATOR PLACEMENT;  Surgeon: Melina Schools, MD;  Location: [facility];  Service: Orthopedics;  Laterality: N/A;   TONSILLECTOMY      Family History  Problem Relation Age of Onset   Diabetes Mother    Cancer Father        prostate   Emphysema Maternal Grandfather    Social History:  reports that she quit smoking about 8 years ago. Her smoking use included cigarettes and e-cigarettes. She has a 7.50 pack-year smoking history. She has never used smokeless tobacco. She reports current alcohol use. She reports current drug use. Drug: Marijuana.  Allergies:  Allergies  Allergen Reactions   Ceftin [Cefuroxime] Other (See Comments)    Renal or swelling, patient cannot remember what happens    Zofran [Ondansetron] Other (See Comments)    Swelling     Medications Prior to Admission  Medication Sig Dispense Refill   ALPRAZolam (XANAX) 0.5  MG tablet Take 0.25-0.5 mg by mouth 2 (two) times daily as needed. For anxiety     amLODipine-benazepril (LOTREL) 10-20 MG per capsule Take 1 capsule by mouth daily.     cholecalciferol (VITAMIN D) 400 UNITS TABS Take 400 Units by mouth daily.     eszopiclone (LUNESTA) 2 MG TABS Take 3 mg by mouth at bedtime as needed for sleep. Take immediately before bedtime     hydrochlorothiazide (HYDRODIURIL) 25 MG tablet Take 25 mg by mouth daily.     Omeprazole (PRILOSEC PO) Take by mouth.     rosuvastatin (CRESTOR) 10 MG tablet      sertraline (ZOLOFT) 100 MG tablet Take 200 mg by mouth daily.     ibuprofen (ADVIL) 800 MG tablet Take 1 tablet (800 mg total) by mouth every 8 (eight) hours as needed. 30 tablet 0   lidocaine-prilocaine (EMLA) cream Apply to affected area once 30 g 3   prochlorperazine (COMPAZINE) 10 MG tablet Take 1 tablet (10 mg total) by mouth every 6 (six) hours as needed (Nausea or vomiting). 30 tablet 1    No results found for this or any previous visit (from the past 48 hour(s)). No results found.  Review of Systems  Constitutional: Negative.     Blood pressure 120/87, pulse 90, temperature 98.4 F (36.9 C), temperature source Oral, resp. rate 18, height 5'  3" (1.6 m), weight 98.5 kg, SpO2 100 %. Physical Exam Constitutional:      Appearance: Normal appearance.  HENT:     Nose: Nose normal.  Eyes:     Pupils: Pupils are equal, round, and reactive to light.  Cardiovascular:     Rate and Rhythm: Normal rate.     Pulses: Normal pulses.  Pulmonary:     Effort: Pulmonary effort is normal.     Breath sounds: Normal breath sounds.     Comments: Scars noted  Skin:    General: Skin is warm.  Neurological:     General: No focal deficit present.     Mental Status: She is alert.      Assessment/Plan Breast cancer in need of port for chemotherapy  Port placement  The procedure has been discussed with the patient.  Alternative therapies have been discussed with  the patient.  Operative risks include bleeding,  Infection,  Organ injury,   Collapse lung , bleeding around the lung , catheter migration/embolization, Nerve injury,  Blood vessel injury,  DVT,  Pulmonary embolism,  Death,  And possible reoperation.  Medical management risks include worsening of present situation.  The success of the procedure is 50 -90 % at treating patients symptoms.  The patient understands and agrees to proceed.  Turner Daniels, MD 11/12/2019, 1:33 PM

## 2019-11-12 NOTE — Interval H&P Note (Signed)
History and Physical Interval Note:  11/12/2019 1:35 PM  [name]  has presented today for surgery, with the diagnosis of POOR  VENOUS ACCESS/BREAST CANCER.  The various methods of treatment have been discussed with the patient and family. After consideration of risks, benefits and other options for treatment, the patient has consented to  Procedure(s): INSERTION PORT-A-CATH WITH ULTRASOUND GUIDANCE (N/A) as a surgical intervention.  The patient's history has been reviewed, patient examined, no change in status, stable for surgery.  I have reviewed the patient's chart and labs.  Questions were answered to the patient's satisfaction.     Turner Daniels MD

## 2019-11-12 NOTE — Anesthesia Preprocedure Evaluation (Signed)
Anesthesia Evaluation  Patient identified by MRN, date of birth, ID band  Reviewed: Allergy & Precautions, NPO status , Patient's Chart, lab work & pertinent test results  Airway Mallampati: II  TM Distance: >3 FB Neck ROM: Full    Dental no notable dental hx.    Pulmonary sleep apnea , former smoker,    Pulmonary exam normal breath sounds clear to auscultation       Cardiovascular Exercise Tolerance: Good hypertension, Pt. on medications Normal cardiovascular exam Rhythm:Regular Rate:Normal  27-Sep-2019 14:35:39 [name] ROUTINE RECORD Normal sinus rhythm Nonspecific T wave abnormality No significant change since last tracing   Neuro/Psych PSYCHIATRIC DISORDERS Anxiety Depression    GI/Hepatic GERD  ,  Endo/Other  Morbid obesity  Renal/GU Renal disease  negative genitourinary   Musculoskeletal  (+) Arthritis ,   Abdominal   Peds  Hematology  (+) anemia ,   Anesthesia Other Findings Breast Cancer  Reproductive/Obstetrics                             Anesthesia Physical  Anesthesia Plan  ASA: III  Anesthesia Plan: General   Post-op Pain Management:    Induction: Intravenous  PONV Risk Score and Plan: 3 and Dexamethasone, Midazolam, Treatment may vary due to age or medical condition and Ondansetron  Airway Management Planned: LMA  Additional Equipment:   Intra-op Plan:   Post-operative Plan: Extubation in OR  Informed Consent: I have reviewed the patients History and Physical, chart, labs and discussed the procedure including the risks, benefits and alternatives for the proposed anesthesia with the patient or authorized representative who has indicated his/her understanding and acceptance.       Plan Discussed with:   Anesthesia Plan Comments:         Anesthesia Quick Evaluation

## 2019-11-12 NOTE — Progress Notes (Signed)
Patient Care Team: Everardo Beals, NP as PCP - General Mauro Kaufmann, RN as Oncology Nurse Navigator Rockwell Germany, RN as Oncology Nurse Navigator  DIAGNOSIS:    ICD-10-CM   1. Malignant neoplasm of upper-outer quadrant of right breast in female, estrogen receptor positive ([place])  C50.411    Z17.0     SUMMARY OF ONCOLOGIC HISTORY: Oncology History  Malignant neoplasm of upper-outer quadrant of right breast in female, estrogen receptor positive ([place])  09/17/2019 Initial Diagnosis   Screening mammogram detected a right breast mass, not palpable on exam. Diagnostic mammogram showed 1.0cm mass at the 12:30 position in the right breast, with a mildly abnormal right axillary lymph node, 4.71m. Biopsy showed IDC in the breast, grade 3, HER-2 negative (1+), ER+ 70%, PR- 0%, Ki67 85%, and the lymph node negative for carcinoma.   09/24/2019 Cancer Staging   Staging form: Breast, AJCC 8th Edition - Clinical stage from 09/24/2019: Stage IB (cT1b, cN0(f), cM0, G3, ER+, PR-, HER2-) - Signed by GNicholas Lose MD on 10/08/2019   10/01/2019 Surgery   Right lumpectomy ([facility]): IDC, grade 3, 2.8cm, clear margins, 7 right axillary lymph nodes negative for carcinoma.   10/23/2019 Oncotype testing   Oncotype DX recurrence score 68: Greater than 39% risk of distant recurrence at 9 years   11/13/2019 -  Chemotherapy   The patient had palonosetron (ALOXI) injection 0.25 mg, 0.25 mg, Intravenous,  Once, 0 of 6 cycles methotrexate (PF) chemo injection 84 mg, 40 mg/m2 = 84 mg, Intravenous,  Once, 0 of 6 cycles cyclophosphamide (CYTOXAN) 1,260 mg in sodium chloride 0.9 % 250 mL chemo infusion, 600 mg/m2 = 1,260 mg, Intravenous,  Once, 0 of 6 cycles fluorouracil (ADRUCIL) chemo injection 1,250 mg, 600 mg/m2 = 1,250 mg, Intravenous,  Once, 0 of 6 cycles  for chemotherapy treatment.      CHIEF COMPLIANT: Cycle 1 CMF  INTERVAL HISTORY: Martha DEMARCOis a 69y.o. with above-mentioned history of right  breast cancer who underwent a right lumpectomy and is currently on adjuvant chemotherapy with CMF. Her port was placed on 11/12/19. She presents to the clinic today for cycle 1.   ALLERGIES:  is allergic to ceftin [cefuroxime] and zofran [ondansetron].  MEDICATIONS:  Current Outpatient Medications  Medication Sig Dispense Refill  . ALPRAZolam (XANAX) 0.5 MG tablet Take 0.25-0.5 mg by mouth 2 (two) times daily as needed. For anxiety    . amLODipine-benazepril (LOTREL) 10-20 MG per capsule Take 1 capsule by mouth daily.    . cholecalciferol (VITAMIN D) 400 UNITS TABS Take 400 Units by mouth daily.    . eszopiclone (LUNESTA) 2 MG TABS Take 3 mg by mouth at bedtime as needed for sleep. Take immediately before bedtime    . hydrochlorothiazide (HYDRODIURIL) 25 MG tablet Take 25 mg by mouth daily.    .Marland Kitchenibuprofen (ADVIL) 800 MG tablet Take 1 tablet (800 mg total) by mouth every 8 (eight) hours as needed. 30 tablet 0  . ibuprofen (ADVIL) 800 MG tablet Take 1 tablet (800 mg total) by mouth every 8 (eight) hours as needed. 30 tablet 0  . lidocaine-prilocaine (EMLA) cream Apply to affected area once 30 g 3  . Omeprazole (PRILOSEC PO) Take by mouth.    . oxyCODONE (OXY IR/ROXICODONE) 5 MG immediate release tablet Take 1 tablet (5 mg total) by mouth every 6 (six) hours as needed for severe pain. 15 tablet 0  . prochlorperazine (COMPAZINE) 10 MG tablet Take 1 tablet (10 mg total)  by mouth every 6 (six) hours as needed (Nausea or vomiting). 30 tablet 1  . rosuvastatin (CRESTOR) 10 MG tablet     . sertraline (ZOLOFT) 100 MG tablet Take 200 mg by mouth daily.     No current facility-administered medications for this visit.   Facility-Administered Medications Ordered in Other Visits  Medication Dose Route Frequency Provider Last Rate Last Admin  . sodium chloride flush (NS) 0.9 % injection 10 mL  10 mL Intravenous PRN Nicholas Lose, MD   10 mL at 11/13/19 0808    PHYSICAL EXAMINATION: ECOG PERFORMANCE  STATUS: 1 - Symptomatic but completely ambulatory  There were no vitals filed for this visit. There were no vitals filed for this visit.   LABORATORY DATA:  I have reviewed the data as listed CMP Latest Ref Rng & Units 11/08/2019 09/27/2019 08/05/2014  Glucose 70 - 99 mg/dL 105(H) 100(H) 106(H)  BUN 8 - 23 mg/dL '14 18 11  ' Creatinine 0.44 - 1.00 mg/dL 0.99 0.78 0.83  Sodium 135 - 145 mmol/L 140 138 138  Potassium 3.5 - 5.1 mmol/L 4.0 3.9 3.7  Chloride 98 - 111 mmol/L 102 106 106  CO2 22 - 32 mmol/L 29 23 20(L)  Calcium 8.9 - 10.3 mg/dL 9.5 9.1 8.7(L)  Total Protein 6.5 - 8.1 g/dL 7.9 7.6 -  Total Bilirubin 0.3 - 1.2 mg/dL 0.8 0.2(L) -  Alkaline Phos 38 - 126 U/L 101 96 -  AST 15 - 41 U/L 35 43(H) -  ALT 0 - 44 U/L 22 32 -    Lab Results  Component Value Date   WBC 6.8 11/08/2019   HGB 11.1 (L) 11/08/2019   HCT 37.0 11/08/2019   MCV 84.3 11/08/2019   PLT 255 11/08/2019   NEUTROABS 3.9 11/08/2019    ASSESSMENT & PLAN:  Malignant neoplasm of upper-outer quadrant of right breast in female, estrogen receptor positive ([place]) 10/01/2019:Right lumpectomy ([facility]): IDC, grade 3, 2.8cm, clear margins, 7 right axillary lymph nodes negative for carcinoma.ER 90%, PR 0%, Ki-67 85%, HER-2 negative T2N0 stage Ib Oncotype DX recurrence score 68, greater than 39% risk of distant recurrence at 9 years  Treatment plan: 1.Adjuvant chemotherapy with CMF (chosen because of PS issues) 2. Adjuvant radiation therapy followed by 3. Adjuvant antiestrogen therapy  CT of the chest abdomen and pelvis: 09/30/2019: Right breast lesion, internal mammary lymph nodes, upper abdominal nodal enlargement (probably related to liver cirrhosis) small lung nodules ------------------------------------------------------------------------------------------------------------------------------------------ Current treatment: Cycle I CMF Labs reviewed, chemo education completed, antiemetics were reviewed. Return to  clinic in 1 week for toxicity check.  No orders of the defined types were placed in this encounter.  The patient has a good understanding of the overall plan. she agrees with it. she will call with any problems that may develop before the next visit here.  Total time spent: 30 mins including face to face time and time spent for planning, charting and coordination of care  Nicholas Lose, MD 11/13/2019  I, Cloyde Reams Dorshimer, am acting as scribe for Dr. Nicholas Lose.  I have reviewed the above documentation for accuracy and completeness, and I agree with the above.

## 2019-11-12 NOTE — Op Note (Signed)
Preoperative diagnosis: PAC needed for chemotherapy   Postoperative diagnosis: Same  Procedure: Portacath Placement with C arm and U/S   Surgeon: Turner Daniels, MD, FACS  Anesthesia: General and 0.25 % marcaine with epinephrine  Clinical History and Indications: The patient is getting ready to begin chemotherapy for her cancer. She  needs a Port-A-Cath for venous access. Risk of bleeding, infection,  Collapse lung,  Death,  DVT,  Organ injury,  Mediastinal injury,  Injury to heart,  Injury to blood vessels,  Nerves,  Migration of catheter,  Embolization of catheter and the need for more surgery.  Description of Procedure: I have seen the patient in the holding area and confirmed the plans for the procedure as noted above. I reviewed the risks and complications again and the patient has no further questions. She wishes to proceed.   The patient was then taken to the operating room. After satisfactory general  anesthesia had been obtained the upper chest and lower neck were prepped and draped as a sterile field. The timeout was done.  The right internal jugular vein  was entered under U/S guidance  and the guidewire threaded into the superior vena cava right atrial area under fluoroscopic guidance. An incision was then made on the anterior chest wall and a subcutaneous pocket fashioned for the port reservoir.  The port tubing was then brought through a subcutaneous tunnel from the port site to the guidewire site.  The port and catheter were attached, locked  and flushed. The catheter was measured and cut to appropriate length.The dilator and peel-away sheath were then advanced over the guidewire while monitoring this with fluoroscopy. The guidewire and dilator were removed and the tubing threaded to approximately 20 cm. The peel-away sheath was then removed. The catheter aspirated and flushed easily. Using fluoroscopy the tip was in the superior vena cava right atrial junction area. It aspirated  and flushed easily. That aspirated and flushed easily.  The reservoir was secured to the fascia with 1 sutures of 2-0 Prolene. A final check with fluoroscopy was done to make sure we had no kinks and good positioning of the tip of the catheter. Everything appeared to be okay. The catheter was aspirated, flushed with dilute heparin and then concentrated aqueous heparin. The port was left accessed.  The incision was then closed with interrupted 3-0 Vicryl, and 4-0 Monocryl subcuticular with Dermabond on the skin.  There were no operative complications. Estimated blood loss was minimal. All counts were correct. The patient tolerated the procedure well.  Turner Daniels, MD, FACS

## 2019-11-12 NOTE — Discharge Instructions (Signed)

## 2019-11-12 NOTE — Transfer of Care (Signed)
Immediate Anesthesia Transfer of Care Note  Patient: [name]  Procedure(s) Performed: INSERTION PORT-A-CATH WITH ULTRASOUND GUIDANCE, ACCESSED (Right Neck)  Patient Location: PACU  Anesthesia Type:General  Level of Consciousness: drowsy and patient cooperative  Airway & Oxygen Therapy: Patient Spontanous Breathing and Patient connected to face mask oxygen  Post-op Assessment: Report given to RN and Post -op Vital signs reviewed and stable  Post vital signs: Reviewed and stable  Last Vitals:  Vitals Value Taken Time  BP    Temp    Pulse 86 11/12/19 1506  Resp 15 11/12/19 1506  SpO2 100 % 11/12/19 1506  Vitals shown include unvalidated device data.  Last Pain:  Vitals:   11/12/19 1250  TempSrc: Oral  PainSc: 10-Worst pain ever      Patients Stated Pain Goal: 7 (15/61/53 7943)  Complications: No complications documented.

## 2019-11-12 NOTE — Anesthesia Postprocedure Evaluation (Signed)
Anesthesia Post Note  Patient: [name]  Procedure(s) Performed: INSERTION PORT-A-CATH WITH ULTRASOUND GUIDANCE, ACCESSED (Right Neck)     Patient location during evaluation: PACU Anesthesia Type: General Level of consciousness: awake and alert Pain management: pain level controlled Vital Signs Assessment: post-procedure vital signs reviewed and stable Respiratory status: spontaneous breathing, nonlabored ventilation and respiratory function stable Cardiovascular status: blood pressure returned to baseline and stable Postop Assessment: no apparent nausea or vomiting Anesthetic complications: no   No complications documented.  Last Vitals:  Vitals:   11/12/19 1530 11/12/19 1549  BP: 115/86 124/85  Pulse: 83 83  Resp: 18 18  Temp:  37.1 C  SpO2: 100% 99%    Last Pain:  Vitals:   11/12/19 1549  TempSrc: Oral  PainSc: 0-No pain                 Lynda Rainwater

## 2019-11-12 NOTE — Anesthesia Procedure Notes (Addendum)
Procedure Name: LMA Insertion Date/Time: 11/12/2019 2:04 PM Performed by: Bufford Spikes, CRNA Pre-anesthesia Checklist: Patient identified, Emergency Drugs available, Suction available and Patient being monitored Patient Re-evaluated:Patient Re-evaluated prior to induction Oxygen Delivery Method: Circle system utilized Preoxygenation: Pre-oxygenation with 100% oxygen Induction Type: IV induction Ventilation: Mask ventilation without difficulty LMA: LMA inserted LMA Size: 4.0 Number of attempts: 1 Placement Confirmation: positive ETCO2 Tube secured with: Tape Dental Injury: Teeth and Oropharynx as per pre-operative assessment

## 2019-11-13 ENCOUNTER — Encounter: Payer: Self-pay | Admitting: *Deleted

## 2019-11-13 ENCOUNTER — Inpatient Hospital Stay: Payer: Medicare PPO

## 2019-11-13 ENCOUNTER — Other Ambulatory Visit: Payer: Self-pay

## 2019-11-13 ENCOUNTER — Inpatient Hospital Stay (HOSPITAL_BASED_OUTPATIENT_CLINIC_OR_DEPARTMENT_OTHER): Payer: Medicare PPO | Admitting: Hematology and Oncology

## 2019-11-13 ENCOUNTER — Telehealth: Payer: Self-pay | Admitting: Hematology and Oncology

## 2019-11-13 DIAGNOSIS — Z170 Estrogen receptor positive status [ER+]: Secondary | ICD-10-CM | POA: Diagnosis not present

## 2019-11-13 DIAGNOSIS — C50411 Malignant neoplasm of upper-outer quadrant of right female breast: Secondary | ICD-10-CM

## 2019-11-13 DIAGNOSIS — Z5111 Encounter for antineoplastic chemotherapy: Secondary | ICD-10-CM | POA: Diagnosis not present

## 2019-11-13 DIAGNOSIS — Z95828 Presence of other vascular implants and grafts: Secondary | ICD-10-CM

## 2019-11-13 LAB — CBC WITH DIFFERENTIAL (CANCER CENTER ONLY)
Abs Immature Granulocytes: 0.04 10*3/uL (ref 0.00–0.07)
Basophils Absolute: 0 10*3/uL (ref 0.0–0.1)
Basophils Relative: 0 %
Eosinophils Absolute: 0 10*3/uL (ref 0.0–0.5)
Eosinophils Relative: 0 %
HCT: 33.6 % — ABNORMAL LOW (ref 36.0–46.0)
Hemoglobin: 10.6 g/dL — ABNORMAL LOW (ref 12.0–15.0)
Immature Granulocytes: 1 %
Lymphocytes Relative: 20 %
Lymphs Abs: 1.6 10*3/uL (ref 0.7–4.0)
MCH: 25.9 pg — ABNORMAL LOW (ref 26.0–34.0)
MCHC: 31.5 g/dL (ref 30.0–36.0)
MCV: 82 fL (ref 80.0–100.0)
Monocytes Absolute: 0.6 10*3/uL (ref 0.1–1.0)
Monocytes Relative: 7 %
Neutro Abs: 6.1 10*3/uL (ref 1.7–7.7)
Neutrophils Relative %: 72 %
Platelet Count: 260 10*3/uL (ref 150–400)
RBC: 4.1 MIL/uL (ref 3.87–5.11)
RDW: 18.4 % — ABNORMAL HIGH (ref 11.5–15.5)
WBC Count: 8.3 10*3/uL (ref 4.0–10.5)
nRBC: 0 % (ref 0.0–0.2)

## 2019-11-13 LAB — CMP (CANCER CENTER ONLY)
ALT: 20 U/L (ref 0–44)
AST: 27 U/L (ref 15–41)
Albumin: 3.7 g/dL (ref 3.5–5.0)
Alkaline Phosphatase: 102 U/L (ref 38–126)
Anion gap: 9 (ref 5–15)
BUN: 13 mg/dL (ref 8–23)
CO2: 27 mmol/L (ref 22–32)
Calcium: 9.3 mg/dL (ref 8.9–10.3)
Chloride: 101 mmol/L (ref 98–111)
Creatinine: 0.89 mg/dL (ref 0.44–1.00)
GFR, Est AFR Am: >60 mL/min (ref >60)
GFR, Estimated: >60 mL/min (ref >60)
Glucose, Bld: 117 mg/dL — ABNORMAL HIGH (ref 70–99)
Potassium: 3.5 mmol/L (ref 3.5–5.1)
Sodium: 137 mmol/L (ref 135–145)
Total Bilirubin: 0.4 mg/dL (ref 0.3–1.2)
Total Protein: 7.7 g/dL (ref 6.5–8.1)

## 2019-11-13 MED ORDER — FLUOROURACIL CHEMO INJECTION 2.5 GM/50ML
600.0000 mg/m2 | Freq: Once | INTRAVENOUS | Status: AC
  Administered 2019-11-13: 1250 mg via INTRAVENOUS
  Filled 2019-11-13: qty 25

## 2019-11-13 MED ORDER — PALONOSETRON HCL INJECTION 0.25 MG/5ML
INTRAVENOUS | Status: AC
  Filled 2019-11-13: qty 5

## 2019-11-13 MED ORDER — METHOTREXATE SODIUM (PF) CHEMO INJECTION 250 MG/10ML
40.0000 mg/m2 | Freq: Once | INTRAMUSCULAR | Status: AC
  Administered 2019-11-13: 84 mg via INTRAVENOUS
  Filled 2019-11-13: qty 3.36

## 2019-11-13 MED ORDER — SODIUM CHLORIDE 0.9 % IV SOLN
10.0000 mg | Freq: Once | INTRAVENOUS | Status: AC
  Administered 2019-11-13: 10 mg via INTRAVENOUS
  Filled 2019-11-13: qty 10

## 2019-11-13 MED ORDER — HEPARIN SOD (PORK) LOCK FLUSH 100 UNIT/ML IV SOLN
500.0000 [IU] | Freq: Once | INTRAVENOUS | Status: AC | PRN
  Administered 2019-11-13: 500 [IU]
  Filled 2019-11-13: qty 5

## 2019-11-13 MED ORDER — SODIUM CHLORIDE 0.9 % IV SOLN
600.0000 mg/m2 | Freq: Once | INTRAVENOUS | Status: AC
  Administered 2019-11-13: 1260 mg via INTRAVENOUS
  Filled 2019-11-13: qty 63

## 2019-11-13 MED ORDER — SODIUM CHLORIDE 0.9% FLUSH
10.0000 mL | INTRAVENOUS | Status: DC | PRN
  Administered 2019-11-13: 10 mL via INTRAVENOUS
  Filled 2019-11-13: qty 10

## 2019-11-13 MED ORDER — OMEPRAZOLE 20 MG PO CPDR: 20.0000 mg | DELAYED_RELEASE_CAPSULE | Freq: Every day | ORAL | Status: AC

## 2019-11-13 MED ORDER — SODIUM CHLORIDE 0.9% FLUSH
10.0000 mL | INTRAVENOUS | Status: DC | PRN
  Administered 2019-11-13: 10 mL
  Filled 2019-11-13: qty 10

## 2019-11-13 MED ORDER — PALONOSETRON HCL INJECTION 0.25 MG/5ML
0.2500 mg | Freq: Once | INTRAVENOUS | Status: AC
  Administered 2019-11-13: 0.25 mg via INTRAVENOUS

## 2019-11-13 MED ORDER — SODIUM CHLORIDE 0.9 % IV SOLN
Freq: Once | INTRAVENOUS | Status: AC
  Filled 2019-11-13: qty 250

## 2019-11-13 NOTE — Progress Notes (Signed)
Received notarized durable power of attorney paperwork from pt.  Placed in HIM folder to be scanned into epic.

## 2019-11-13 NOTE — Assessment & Plan Note (Signed)
10/01/2019:Right lumpectomy ([facility]): IDC, grade 3, 2.8cm, clear margins, 7 right axillary lymph nodes negative for carcinoma.ER 90%, PR 0%, Ki-67 85%, HER-2 negative T2N0 stage Ib Oncotype DX recurrence score 68, greater than 39% risk of distant recurrence at 9 years  Treatment plan: 1.Adjuvant chemotherapy with CMF (chosen because of PS issues) 2. Adjuvant radiation therapy followed by 3. Adjuvant antiestrogen therapy  CT of the chest abdomen and pelvis: 09/30/2019: Right breast lesion, internal mammary lymph nodes, upper abdominal nodal enlargement (probably related to liver cirrhosis) small lung nodules ------------------------------------------------------------------------------------------------------------------------------------------ Current treatment: Stage I CMF Labs reviewed, chemo education completed, antiemetics were reviewed. Return to clinic in 1 week for toxicity check.

## 2019-11-13 NOTE — Patient Instructions (Signed)

## 2019-11-13 NOTE — Telephone Encounter (Signed)
Cancelled appt on 10/1 per 9/29 staff message from carol and RN Lonn [place] due to insurance purposes.

## 2019-11-13 NOTE — Patient Instructions (Signed)
[facility] Discharge Instructions for Patients Receiving Chemotherapy  Today you received the following chemotherapy agents: cyclophosphamide, methotrexate, fluorouracil  To help prevent nausea and vomiting after your treatment, we encourage you to take your nausea medication as needed.   If you develop nausea and vomiting that is not controlled by your nausea medication, call the clinic.   BELOW ARE SYMPTOMS THAT SHOULD BE REPORTED IMMEDIATELY:  *FEVER GREATER THAN 100.5 F  *CHILLS WITH OR WITHOUT FEVER  NAUSEA AND VOMITING THAT IS NOT CONTROLLED WITH YOUR NAUSEA MEDICATION  *UNUSUAL SHORTNESS OF BREATH  *UNUSUAL BRUISING OR BLEEDING  TENDERNESS IN MOUTH AND THROAT WITH OR WITHOUT PRESENCE OF ULCERS  *URINARY PROBLEMS  *BOWEL PROBLEMS  UNUSUAL RASH Items with * indicate a potential emergency and should be followed up as soon as possible.  Feel free to call the clinic should you have any questions or concerns. The clinic phone number is [phone].  Please show the CHEMO ALERT CARD at check-in to the Emergency Department and triage nurse.  Cyclophosphamide Injection What is this medicine? CYCLOPHOSPHAMIDE (sye kloe FOSS fa mide) is a chemotherapy drug. It slows the growth of cancer cells. This medicine is used to treat many types of cancer like lymphoma, myeloma, leukemia, breast cancer, and ovarian cancer, to name a few. This medicine may be used for other purposes; ask your health care provider or pharmacist if you have questions. COMMON BRAND NAME(S): Cytoxan, Neosar What should I tell my health care provider before I take this medicine? They need to know if you have any of these conditions:  heart disease  history of irregular heartbeat  infection  kidney disease  liver disease  low blood counts, like white cells, platelets, or red blood cells  on hemodialysis  recent or ongoing radiation therapy  scarring or thickening of the  lungs  trouble passing urine  an unusual or allergic reaction to cyclophosphamide, other medicines, foods, dyes, or preservatives  pregnant or trying to get pregnant  breast-feeding How should I use this medicine? This drug is usually given as an injection into a vein or muscle or by infusion into a vein. It is administered in a hospital or clinic by a specially trained health care professional. Talk to your pediatrician regarding the use of this medicine in children. Special care may be needed. Overdosage: If you think you have taken too much of this medicine contact a poison control center or emergency room at once. NOTE: This medicine is only for you. Do not share this medicine with others. What if I miss a dose? It is important not to miss your dose. Call your doctor or health care professional if you are unable to keep an appointment. What may interact with this medicine?  amphotericin B  azathioprine  certain antivirals for HIV or hepatitis  certain medicines for blood pressure, heart disease, irregular heart beat  certain medicines that treat or prevent blood clots like warfarin  certain other medicines for cancer  cyclosporine  etanercept  indomethacin  medicines that relax muscles for surgery  medicines to increase blood counts  metronidazole This list may not describe all possible interactions. Give your health care provider a list of all the medicines, herbs, non-prescription drugs, or dietary supplements you use. Also tell them if you smoke, drink alcohol, or use illegal drugs. Some items may interact with your medicine. What should I watch for while using this medicine? Your condition will be monitored carefully while you are receiving this   medicine. You may need blood work done while you are taking this medicine. Drink water or other fluids as directed. Urinate often, even at night. Some products may contain alcohol. Ask your health care professional if  this medicine contains alcohol. Be sure to tell all health care professionals you are taking this medicine. Certain medicines, like metronidazole and disulfiram, can cause an unpleasant reaction when taken with alcohol. The reaction includes flushing, headache, nausea, vomiting, sweating, and increased thirst. The reaction can last from 30 minutes to several hours. Do not become pregnant while taking this medicine or for 1 year after stopping it. Women should inform their health care professional if they wish to become pregnant or think they might be pregnant. Men should not father a child while taking this medicine and for 4 months after stopping it. There is potential for serious side effects to an unborn child. Talk to your health care professional for more information. Do not breast-feed an infant while taking this medicine or for 1 week after stopping it. This medicine has caused ovarian failure in some women. This medicine may make it more difficult to get pregnant. Talk to your health care professional if you are concerned about your fertility. This medicine has caused decreased sperm counts in some men. This may make it more difficult to father a child. Talk to your health care professional if you are concerned about your fertility. Call your health care professional for advice if you get a fever, chills, or sore throat, or other symptoms of a cold or flu. Do not treat yourself. This medicine decreases your body's ability to fight infections. Try to avoid being around people who are sick. Avoid taking medicines that contain aspirin, acetaminophen, ibuprofen, naproxen, or ketoprofen unless instructed by your health care professional. These medicines may hide a fever. Talk to your health care professional about your risk of cancer. You may be more at risk for certain types of cancer if you take this medicine. If you are going to need surgery or other procedure, tell your health care professional that  you are using this medicine. Be careful brushing or flossing your teeth or using a toothpick because you may get an infection or bleed more easily. If you have any dental work done, tell your dentist you are receiving this medicine. What side effects may I notice from receiving this medicine? Side effects that you should report to your doctor or health care professional as soon as possible:  allergic reactions like skin rash, itching or hives, swelling of the face, lips, or tongue  breathing problems  nausea, vomiting  signs and symptoms of bleeding such as bloody or black, tarry stools; red or dark [place] urine; spitting up blood or [place] material that looks like coffee grounds; red spots on the skin; unusual bruising or bleeding from the eyes, gums, or nose  signs and symptoms of heart failure like fast, irregular heartbeat, sudden weight gain; swelling of the ankles, feet, hands  signs and symptoms of infection like fever; chills; cough; sore throat; pain or trouble passing urine  signs and symptoms of kidney injury like trouble passing urine or change in the amount of urine  signs and symptoms of liver injury like dark yellow or [place] urine; general ill feeling or flu-like symptoms; light-colored stools; loss of appetite; nausea; right upper belly pain; unusually weak or tired; yellowing of the eyes or skin Side effects that usually do not require medical attention (report to your doctor or health care   professional if they continue or are bothersome):  confusion  decreased hearing  diarrhea  facial flushing  hair loss  headache  loss of appetite  missed menstrual periods  signs and symptoms of low red blood cells or anemia such as unusually weak or tired; feeling faint or lightheaded; falls  skin discoloration This list may not describe all possible side effects. Call your doctor for medical advice about side effects. You may report side effects to FDA at  1-800-FDA-1088. Where should I keep my medicine? This drug is given in a hospital or clinic and will not be stored at home. NOTE: This sheet is a summary. It may not cover all possible information. If you have questions about this medicine, talk to your doctor, pharmacist, or health care provider.  2020 Elsevier/Gold Standard (2018-11-05 09:53:29)  Methotrexate injection What is this medicine? METHOTREXATE (METH oh TREX ate) is a chemotherapy drug used to treat cancer including breast cancer, leukemia, and lymphoma. This medicine can also be used to treat psoriasis and certain kinds of arthritis. This medicine may be used for other purposes; ask your health care provider or pharmacist if you have questions. What should I tell my health care provider before I take this medicine? They need to know if you have any of these conditions:  fluid in the stomach area or lungs  if you often drink alcohol  infection or immune system problems  kidney disease  liver disease  low blood counts, like low white cell, platelet, or red cell counts  lung disease  radiation therapy  stomach ulcers  ulcerative colitis  an unusual or allergic reaction to methotrexate, other medicines, foods, dyes, or preservatives  pregnant or trying to get pregnant  breast-feeding How should I use this medicine? This medicine is for infusion into a vein or for injection into muscle or into the spinal fluid (whichever applies). It is usually given by a health care professional in a hospital or clinic setting. In rare cases, you might get this medicine at home. You will be taught how to give this medicine. Use exactly as directed. Take your medicine at regular intervals. Do not take your medicine more often than directed. If this medicine is used for arthritis or psoriasis, it should be taken weekly, NOT daily. It is important that you put your used needles and syringes in a special sharps container. Do not put  them in a trash can. If you do not have a sharps container, call your pharmacist or healthcare provider to get one. Talk to your pediatrician regarding the use of this medicine in children. While this drug may be prescribed for children as young as 2 years for selected conditions, precautions do apply. Overdosage: If you think you have taken too much of this medicine contact a poison control center or emergency room at once. NOTE: This medicine is only for you. Do not share this medicine with others. What if I miss a dose? It is important not to miss your dose. Call your doctor or health care professional if you are unable to keep an appointment. If you give yourself the medicine and you miss a dose, talk with your doctor or health care professional. Do not take double or extra doses. What may interact with this medicine? This medicine may interact with the following medications:  acitretin  aspirin or aspirin-like medicines including salicylates  azathioprine  certain antibiotics like chloramphenicol, penicillin, tetracycline  certain medicines for stomach problems like esomeprazole, omeprazole, pantoprazole  cyclosporine    gold  hydroxychloroquine  live virus vaccines  mercaptopurine  NSAIDs, medicines for pain and inflammation, like ibuprofen or naproxen  other cytotoxic agents  penicillamine  phenylbutazone  phenytoin  probenacid  retinoids such as isotretinoin and tretinoin  steroid medicines like prednisone or cortisone  sulfonamides like sulfasalazine and trimethoprim/sulfamethoxazole  theophylline This list may not describe all possible interactions. Give your health care provider a list of all the medicines, herbs, non-prescription drugs, or dietary supplements you use. Also tell them if you smoke, drink alcohol, or use illegal drugs. Some items may interact with your medicine. What should I watch for while using this medicine? Avoid alcoholic drinks. In  some cases, you may be given additional medicines to help with side effects. Follow all directions for their use. This medicine can make you more sensitive to the sun. Keep out of the sun. If you cannot avoid being in the sun, wear protective clothing and use sunscreen. Do not use sun lamps or tanning beds/booths. You may get drowsy or dizzy. Do not drive, use machinery, or do anything that needs mental alertness until you know how this medicine affects you. Do not stand or sit up quickly, especially if you are an older patient. This reduces the risk of dizzy or fainting spells. You may need blood work done while you are taking this medicine. Call your doctor or health care professional for advice if you get a fever, chills or sore throat, or other symptoms of a cold or flu. Do not treat yourself. This drug decreases your body's ability to fight infections. Try to avoid being around people who are sick. This medicine may increase your risk to bruise or bleed. Call your doctor or health care professional if you notice any unusual bleeding. Check with your doctor or health care professional if you get an attack of severe diarrhea, nausea and vomiting, or if you sweat a lot. The loss of too much body fluid can make it dangerous for you to take this medicine. Talk to your doctor about your risk of cancer. You may be more at risk for certain types of cancers if you take this medicine. Both men and women must use effective birth control with this medicine. Do not become pregnant while taking this medicine or until at least 1 normal menstrual cycle has occurred after stopping it. Women should inform their doctor if they wish to become pregnant or think they might be pregnant. Men should not father a child while taking this medicine and for 3 months after stopping it. There is a potential for serious side effects to an unborn child. Talk to your health care professional or pharmacist for more information. Do not  breast-feed an infant while taking this medicine. What side effects may I notice from receiving this medicine? Side effects that you should report to your doctor or health care professional as soon as possible:  allergic reactions like skin rash, itching or hives, swelling of the face, lips, or tongue  back pain  breathing problems or shortness of breath  confusion  diarrhea  dry, nonproductive cough  low blood counts - this medicine may decrease the number of white blood cells, red blood cells and platelets. You may be at increased risk of infections and bleeding  mouth sores  redness, blistering, peeling or loosening of the skin, including inside the mouth  seizures  severe headaches  signs of infection - fever or chills, cough, sore throat, pain or difficulty passing urine  signs and   symptoms of bleeding such as bloody or black, tarry stools; red or dark-[place] urine; spitting up blood or [place] material that looks like coffee grounds; red spots on the skin; unusual bruising or bleeding from the eye, gums, or nose  signs and symptoms of kidney injury like trouble passing urine or change in the amount of urine  signs and symptoms of liver injury like dark yellow or [place] urine; general ill feeling or flu-like symptoms; light-colored stools; loss of appetite; nausea; right upper belly pain; unusually weak or tired; yellowing of the eyes or skin  stiff neck  vomiting Side effects that usually do not require medical attention (report to your doctor or health care professional if they continue or are bothersome):  dizziness  hair loss  headache  stomach pain  upset stomach This list may not describe all possible side effects. Call your doctor for medical advice about side effects. You may report side effects to FDA at 1-800-FDA-1088. Where should I keep my medicine? If you are using this medicine at home, you will be instructed on how to store this medicine. Throw away  any unused medicine after the expiration date on the label. NOTE: This sheet is a summary. It may not cover all possible information. If you have questions about this medicine, talk to your doctor, pharmacist, or health care provider.  2020 Elsevier/Gold Standard (2016-09-22 13:31:42)  Fluorouracil, 5-FU injection What is this medicine? FLUOROURACIL, 5-FU (flure oh YOOR a sil) is a chemotherapy drug. It slows the growth of cancer cells. This medicine is used to treat many types of cancer like breast cancer, colon or rectal cancer, pancreatic cancer, and stomach cancer. This medicine may be used for other purposes; ask your health care provider or pharmacist if you have questions. COMMON BRAND NAME(S): Adrucil What should I tell my health care provider before I take this medicine? They need to know if you have any of these conditions:  blood disorders  dihydropyrimidine dehydrogenase (DPD) deficiency  infection (especially a virus infection such as chickenpox, cold sores, or herpes)  kidney disease  liver disease  malnourished, poor nutrition  recent or ongoing radiation therapy  an unusual or allergic reaction to fluorouracil, other chemotherapy, other medicines, foods, dyes, or preservatives  pregnant or trying to get pregnant  breast-feeding How should I use this medicine? This drug is given as an infusion or injection into a vein. It is administered in a hospital or clinic by a specially trained health care professional. Talk to your pediatrician regarding the use of this medicine in children. Special care may be needed. Overdosage: If you think you have taken too much of this medicine contact a poison control center or emergency room at once. NOTE: This medicine is only for you. Do not share this medicine with others. What if I miss a dose? It is important not to miss your dose. Call your doctor or health care professional if you are unable to keep an appointment. What may  interact with this medicine?  allopurinol  cimetidine  dapsone  digoxin  hydroxyurea  leucovorin  levamisole  medicines for seizures like ethotoin, fosphenytoin, phenytoin  medicines to increase blood counts like filgrastim, pegfilgrastim, sargramostim  medicines that treat or prevent blood clots like warfarin, enoxaparin, and dalteparin  methotrexate  metronidazole  pyrimethamine  some other chemotherapy drugs like busulfan, cisplatin, estramustine, vinblastine  trimethoprim  trimetrexate  vaccines Talk to your doctor or health care professional before taking any of these medicines:  acetaminophen    aspirin  ibuprofen  ketoprofen  naproxen This list may not describe all possible interactions. Give your health care provider a list of all the medicines, herbs, non-prescription drugs, or dietary supplements you use. Also tell them if you smoke, drink alcohol, or use illegal drugs. Some items may interact with your medicine. What should I watch for while using this medicine? Visit your doctor for checks on your progress. This drug may make you feel generally unwell. This is not uncommon, as chemotherapy can affect healthy cells as well as cancer cells. Report any side effects. Continue your course of treatment even though you feel ill unless your doctor tells you to stop. In some cases, you may be given additional medicines to help with side effects. Follow all directions for their use. Call your doctor or health care professional for advice if you get a fever, chills or sore throat, or other symptoms of a cold or flu. Do not treat yourself. This drug decreases your body's ability to fight infections. Try to avoid being around people who are sick. This medicine may increase your risk to bruise or bleed. Call your doctor or health care professional if you notice any unusual bleeding. Be careful brushing and flossing your teeth or using a toothpick because you may get an  infection or bleed more easily. If you have any dental work done, tell your dentist you are receiving this medicine. Avoid taking products that contain aspirin, acetaminophen, ibuprofen, naproxen, or ketoprofen unless instructed by your doctor. These medicines may hide a fever. Do not become pregnant while taking this medicine. Women should inform their doctor if they wish to become pregnant or think they might be pregnant. There is a potential for serious side effects to an unborn child. Talk to your health care professional or pharmacist for more information. Do not breast-feed an infant while taking this medicine. Men should inform their doctor if they wish to father a child. This medicine may lower sperm counts. Do not treat diarrhea with over the counter products. Contact your doctor if you have diarrhea that lasts more than 2 days or if it is severe and watery. This medicine can make you more sensitive to the sun. Keep out of the sun. If you cannot avoid being in the sun, wear protective clothing and use sunscreen. Do not use sun lamps or tanning beds/booths. What side effects may I notice from receiving this medicine? Side effects that you should report to your doctor or health care professional as soon as possible:  allergic reactions like skin rash, itching or hives, swelling of the face, lips, or tongue  low blood counts - this medicine may decrease the number of white blood cells, red blood cells and platelets. You may be at increased risk for infections and bleeding.  signs of infection - fever or chills, cough, sore throat, pain or difficulty passing urine  signs of decreased platelets or bleeding - bruising, pinpoint red spots on the skin, black, tarry stools, blood in the urine  signs of decreased red blood cells - unusually weak or tired, fainting spells, lightheadedness  breathing problems  changes in vision  chest pain  mouth sores  nausea and vomiting  pain, swelling,  redness at site where injected  pain, tingling, numbness in the hands or feet  redness, swelling, or sores on hands or feet  stomach pain  unusual bleeding Side effects that usually do not require medical attention (report to your doctor or health care professional if they   continue or are bothersome):  changes in finger or toe nails  diarrhea  dry or itchy skin  hair loss  headache  loss of appetite  sensitivity of eyes to the light  stomach upset  unusually teary eyes This list may not describe all possible side effects. Call your doctor for medical advice about side effects. You may report side effects to FDA at 1-800-FDA-1088. Where should I keep my medicine? This drug is given in a hospital or clinic and will not be stored at home. NOTE: This sheet is a summary. It may not cover all possible information. If you have questions about this medicine, talk to your doctor, pharmacist, or health care provider.  2020 Elsevier/Gold Standard (2007-06-06 13:53:16)  

## 2019-11-14 ENCOUNTER — Encounter (HOSPITAL_BASED_OUTPATIENT_CLINIC_OR_DEPARTMENT_OTHER): Payer: Self-pay | Admitting: Surgery

## 2019-11-14 ENCOUNTER — Telehealth: Payer: Self-pay | Admitting: Hematology and Oncology

## 2019-11-14 NOTE — Telephone Encounter (Signed)
No 9/29 los, no changes made to pt schedule

## 2019-11-15 ENCOUNTER — Inpatient Hospital Stay: Payer: Medicare PPO

## 2019-11-20 ENCOUNTER — Inpatient Hospital Stay: Payer: Medicare PPO

## 2019-11-20 ENCOUNTER — Inpatient Hospital Stay: Payer: Medicare PPO | Attending: Hematology and Oncology | Admitting: Hematology and Oncology

## 2019-11-20 DIAGNOSIS — Z5111 Encounter for antineoplastic chemotherapy: Secondary | ICD-10-CM | POA: Insufficient documentation

## 2019-11-20 DIAGNOSIS — Z170 Estrogen receptor positive status [ER+]: Secondary | ICD-10-CM | POA: Insufficient documentation

## 2019-11-20 DIAGNOSIS — Z5189 Encounter for other specified aftercare: Secondary | ICD-10-CM | POA: Insufficient documentation

## 2019-11-20 DIAGNOSIS — D649 Anemia, unspecified: Secondary | ICD-10-CM | POA: Insufficient documentation

## 2019-11-20 DIAGNOSIS — C50411 Malignant neoplasm of upper-outer quadrant of right female breast: Secondary | ICD-10-CM | POA: Insufficient documentation

## 2019-11-20 NOTE — Assessment & Plan Note (Deleted)
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 Adjuvant radiation therapy followed by 3. 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

## 2019-11-20 NOTE — Progress Notes (Signed)
Patient Care Team: Everardo Beals, NP as PCP - General Mauro Kaufmann, RN as Oncology Nurse Navigator Rockwell Germany, RN as Oncology Nurse Navigator  DIAGNOSIS:    ICD-10-CM   1. Malignant neoplasm of upper-outer quadrant of right breast in female, estrogen receptor positive ([place])  C50.411    Z17.0     SUMMARY OF ONCOLOGIC HISTORY: Oncology History  Malignant neoplasm of upper-outer quadrant of right breast in female, estrogen receptor positive ([place])  09/17/2019 Initial Diagnosis   Screening mammogram detected a right breast mass, not palpable on exam. Diagnostic mammogram showed 1.0cm mass at the 12:30 position in the right breast, with a mildly abnormal right axillary lymph node, 4.92mm. Biopsy showed IDC in the breast, grade 3, HER-2 negative (1+), ER+ 70%, PR- 0%, Ki67 85%, and the lymph node negative for carcinoma.   09/24/2019 Cancer Staging   Staging form: Breast, AJCC 8th Edition - Clinical stage from 09/24/2019: Stage IB (cT1b, cN0(f), cM0, G3, ER+, PR-, HER2-) - Signed by Nicholas Lose, MD on 10/08/2019   10/01/2019 Surgery   Right lumpectomy ([facility]): IDC, grade 3, 2.8cm, clear margins, 7 right axillary lymph nodes negative for carcinoma.   10/23/2019 Oncotype testing   Oncotype DX recurrence score 68: Greater than 39% risk of distant recurrence at 9 years   11/13/2019 -  Chemotherapy   The patient had palonosetron (ALOXI) injection 0.25 mg, 0.25 mg, Intravenous,  Once, 1 of 6 cycles Administration: 0.25 mg (11/13/2019) methotrexate (PF) chemo injection 84 mg, 40 mg/m2 = 84 mg, Intravenous,  Once, 1 of 6 cycles Administration: 84 mg (11/13/2019) cyclophosphamide (CYTOXAN) 1,260 mg in sodium chloride 0.9 % 250 mL chemo infusion, 600 mg/m2 = 1,260 mg, Intravenous,  Once, 1 of 6 cycles Administration: 1,260 mg (11/13/2019) fluorouracil (ADRUCIL) chemo injection 1,250 mg, 600 mg/m2 = 1,250 mg, Intravenous,  Once, 1 of 6 cycles Administration: 1,250 mg (11/13/2019)  for  chemotherapy treatment.      CHIEF COMPLIANT: Cycle 1 Day 8 CMF  INTERVAL HISTORY: [name] is a 69 y.o. with above-mentioned history of right breast cancer who underwent a right lumpectomy and is currently on adjuvant chemotherapy with CMF. She presents to the clinic today for a toxicity check following cycle 1.    ALLERGIES:  is allergic to ceftin [cefuroxime] and zofran [ondansetron].  MEDICATIONS:  Current Outpatient Medications  Medication Sig Dispense Refill  . ALPRAZolam (XANAX) 0.5 MG tablet Take 0.25-0.5 mg by mouth 2 (two) times daily as needed. For anxiety    . amLODipine-benazepril (LOTREL) 10-20 MG per capsule Take 1 capsule by mouth daily.    . cholecalciferol (VITAMIN D) 400 UNITS TABS Take 400 Units by mouth daily.    . eszopiclone (LUNESTA) 2 MG TABS Take 3 mg by mouth at bedtime as needed for sleep. Take immediately before bedtime    . hydrochlorothiazide (HYDRODIURIL) 25 MG tablet Take 25 mg by mouth daily.    Marland Kitchen ibuprofen (ADVIL) 800 MG tablet Take 1 tablet (800 mg total) by mouth every 8 (eight) hours as needed. 30 tablet 0  . ibuprofen (ADVIL) 800 MG tablet Take 1 tablet (800 mg total) by mouth every 8 (eight) hours as needed. 30 tablet 0  . lidocaine-prilocaine (EMLA) cream Apply to affected area once 30 g 3  . omeprazole (PRILOSEC) 20 MG capsule Take 1 capsule (20 mg total) by mouth daily.    Marland Kitchen oxyCODONE (OXY IR/ROXICODONE) 5 MG immediate release tablet Take 1 tablet (5 mg total) by mouth  every 6 (six) hours as needed for severe pain. 15 tablet 0  . prochlorperazine (COMPAZINE) 10 MG tablet Take 1 tablet (10 mg total) by mouth every 6 (six) hours as needed (Nausea or vomiting). 30 tablet 1  . rosuvastatin (CRESTOR) 10 MG tablet     . sertraline (ZOLOFT) 100 MG tablet Take 200 mg by mouth daily.     No current facility-administered medications for this visit.   Facility-Administered Medications Ordered in Other Visits  Medication Dose Route Frequency Provider  Last Rate Last Admin  . sodium chloride flush (NS) 0.9 % injection 10 mL  10 mL Intracatheter PRN Nicholas Lose, MD   10 mL at 11/21/19 0943    PHYSICAL EXAMINATION: ECOG PERFORMANCE STATUS: 1 - Symptomatic but completely ambulatory  There were no vitals filed for this visit. There were no vitals filed for this visit.  LABORATORY DATA:  I have reviewed the data as listed CMP Latest Ref Rng & Units 11/13/2019 11/08/2019 09/27/2019  Glucose 70 - 99 mg/dL 117(H) 105(H) 100(H)  BUN 8 - 23 mg/dL $Remove'13 14 18  'KFmBmte$ Creatinine 0.44 - 1.00 mg/dL 0.89 0.99 0.78  Sodium 135 - 145 mmol/L 137 140 138  Potassium 3.5 - 5.1 mmol/L 3.5 4.0 3.9  Chloride 98 - 111 mmol/L 101 102 106  CO2 22 - 32 mmol/L $RemoveB'27 29 23  'XNpWaDJT$ Calcium 8.9 - 10.3 mg/dL 9.3 9.5 9.1  Total Protein 6.5 - 8.1 g/dL 7.7 7.9 7.6  Total Bilirubin 0.3 - 1.2 mg/dL 0.4 0.8 0.2(L)  Alkaline Phos 38 - 126 U/L 102 101 96  AST 15 - 41 U/L 27 35 43(H)  ALT 0 - 44 U/L 20 22 32    Lab Results  Component Value Date   WBC 6.2 11/21/2019   HGB 10.4 (L) 11/21/2019   HCT 33.0 (L) 11/21/2019   MCV 81.5 11/21/2019   PLT 217 11/21/2019   NEUTROABS 4.1 11/21/2019    ASSESSMENT & PLAN:  Malignant neoplasm of upper-outer quadrant of right breast in female, estrogen receptor positive ([place]) 10/01/2019:Right lumpectomy ([facility]): IDC, grade 3, 2.8cm, clear margins, 7 right axillary lymph nodes negative for carcinoma.ER 90%, PR 0%, Ki-67 85%, HER-2 negative T2N0 stage Ib Oncotype DX recurrence score 68, greater than 39% risk of distant recurrence at 9 years  Treatment plan: 1.Adjuvant chemotherapy withCMF (chosen because of PS issues) 2. Adjuvant radiation therapy followed by 3. Adjuvant antiestrogen therapy  CT of the chest abdomen and pelvis: 09/30/2019: Right breast lesion, internal mammary lymph nodes, upper abdominal nodal enlargement (probably related to liver cirrhosis) small lung  nodules ------------------------------------------------------------------------------------------------------------------------------------------ Current treatment: Cycle 1 day 8 CMF Chemo toxicities: 1.  Patient has chronic nausea even before chemo.  She tried Compazine and felt that it caused more nausea and therefore she discontinued it. 2. anemia: Monitoring closely. Overall she tolerated chemotherapy extremely well.  Encouraged to continue to drink enough water and eat more protein and cut down on junk food.  Return to clinic in 2 weeks for cycle 2  No orders of the defined types were placed in this encounter.  The patient has a good understanding of the overall plan. she agrees with it. she will call with any problems that may develop before the next visit here.  Total time spent: 30 mins including face to face time and time spent for planning, charting and coordination of care  Nicholas Lose, MD 11/21/2019  I, Cloyde Reams Dorshimer, am acting as scribe for Dr. Nicholas Lose.  I have reviewed the  above documentation for accuracy and completeness, and I agree with the above.       

## 2019-11-21 ENCOUNTER — Inpatient Hospital Stay: Payer: Medicare PPO | Admitting: Hematology and Oncology

## 2019-11-21 ENCOUNTER — Encounter: Payer: Self-pay | Admitting: *Deleted

## 2019-11-21 ENCOUNTER — Inpatient Hospital Stay: Payer: Medicare PPO

## 2019-11-21 ENCOUNTER — Other Ambulatory Visit: Payer: Self-pay

## 2019-11-21 DIAGNOSIS — C50411 Malignant neoplasm of upper-outer quadrant of right female breast: Secondary | ICD-10-CM

## 2019-11-21 DIAGNOSIS — Z95828 Presence of other vascular implants and grafts: Secondary | ICD-10-CM

## 2019-11-21 DIAGNOSIS — Z5111 Encounter for antineoplastic chemotherapy: Secondary | ICD-10-CM | POA: Diagnosis present

## 2019-11-21 DIAGNOSIS — Z5189 Encounter for other specified aftercare: Secondary | ICD-10-CM | POA: Diagnosis not present

## 2019-11-21 DIAGNOSIS — Z170 Estrogen receptor positive status [ER+]: Secondary | ICD-10-CM

## 2019-11-21 DIAGNOSIS — D649 Anemia, unspecified: Secondary | ICD-10-CM | POA: Diagnosis not present

## 2019-11-21 LAB — CBC WITH DIFFERENTIAL (CANCER CENTER ONLY)
Abs Immature Granulocytes: 0.09 10*3/uL — ABNORMAL HIGH (ref 0.00–0.07)
Basophils Absolute: 0 10*3/uL (ref 0.0–0.1)
Basophils Relative: 1 %
Eosinophils Absolute: 0.2 10*3/uL (ref 0.0–0.5)
Eosinophils Relative: 3 %
HCT: 33 % — ABNORMAL LOW (ref 36.0–46.0)
Hemoglobin: 10.4 g/dL — ABNORMAL LOW (ref 12.0–15.0)
Immature Granulocytes: 2 %
Lymphocytes Relative: 24 %
Lymphs Abs: 1.5 10*3/uL (ref 0.7–4.0)
MCH: 25.7 pg — ABNORMAL LOW (ref 26.0–34.0)
MCHC: 31.5 g/dL (ref 30.0–36.0)
MCV: 81.5 fL (ref 80.0–100.0)
Monocytes Absolute: 0.3 10*3/uL (ref 0.1–1.0)
Monocytes Relative: 6 %
Neutro Abs: 4.1 10*3/uL (ref 1.7–7.7)
Neutrophils Relative %: 64 %
Platelet Count: 217 10*3/uL (ref 150–400)
RBC: 4.05 MIL/uL (ref 3.87–5.11)
RDW: 17.2 % — ABNORMAL HIGH (ref 11.5–15.5)
WBC Count: 6.2 10*3/uL (ref 4.0–10.5)
nRBC: 0 % (ref 0.0–0.2)

## 2019-11-21 LAB — CMP (CANCER CENTER ONLY)
ALT: 23 U/L (ref 0–44)
AST: 30 U/L (ref 15–41)
Albumin: 3.9 g/dL (ref 3.5–5.0)
Alkaline Phosphatase: 112 U/L (ref 38–126)
Anion gap: 8 (ref 5–15)
BUN: 16 mg/dL (ref 8–23)
CO2: 27 mmol/L (ref 22–32)
Calcium: 9.1 mg/dL (ref 8.9–10.3)
Chloride: 102 mmol/L (ref 98–111)
Creatinine: 0.92 mg/dL (ref 0.44–1.00)
GFR, Estimated: >60 mL/min (ref >60)
Glucose, Bld: 112 mg/dL — ABNORMAL HIGH (ref 70–99)
Potassium: 3.2 mmol/L — ABNORMAL LOW (ref 3.5–5.1)
Sodium: 137 mmol/L (ref 135–145)
Total Bilirubin: 0.4 mg/dL (ref 0.3–1.2)
Total Protein: 7.8 g/dL (ref 6.5–8.1)

## 2019-11-21 MED ORDER — HEPARIN SOD (PORK) LOCK FLUSH 100 UNIT/ML IV SOLN
500.0000 [IU] | Freq: Once | INTRAVENOUS | Status: AC | PRN
  Administered 2019-11-21: 500 [IU]
  Filled 2019-11-21: qty 5

## 2019-11-21 MED ORDER — SODIUM CHLORIDE 0.9% FLUSH
10.0000 mL | INTRAVENOUS | Status: DC | PRN
  Administered 2019-11-21: 10 mL
  Filled 2019-11-21: qty 10

## 2019-11-21 NOTE — Assessment & Plan Note (Signed)
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 Adjuvant radiation therapy followed by 3. Adjuvant antiestrogen therapy  CT of the chest abdomen and pelvis: 09/30/2019: Right breast lesion, internal mammary lymph nodes, upper abdominal nodal enlargement (probably related to liver cirrhosis) small lung nodules ------------------------------------------------------------------------------------------------------------------------------------------ Current treatment: Cycle 1 day 8 CMF Chemo toxicities:  Return to clinic in 2 weeks for cycle 2

## 2019-12-03 NOTE — Progress Notes (Signed)
Patient Care Team: Everardo Beals, NP as PCP - General Mauro Kaufmann, RN as Oncology Nurse Navigator Rockwell Germany, RN as Oncology Nurse Navigator  DIAGNOSIS:    ICD-10-CM   1. Malignant neoplasm of upper-outer quadrant of right breast in female, estrogen receptor positive ([place])  C50.411    Z17.0     SUMMARY OF ONCOLOGIC HISTORY: Oncology History  Malignant neoplasm of upper-outer quadrant of right breast in female, estrogen receptor positive ([place])  09/17/2019 Initial Diagnosis   Screening mammogram detected a right breast mass, not palpable on exam. Diagnostic mammogram showed 1.0cm mass at the 12:30 position in the right breast, with a mildly abnormal right axillary lymph node, 4.18m. Biopsy showed IDC in the breast, grade 3, HER-2 negative (1+), ER+ 70%, PR- 0%, Ki67 85%, and the lymph node negative for carcinoma.   09/24/2019 Cancer Staging   Staging form: Breast, AJCC 8th Edition - Clinical stage from 09/24/2019: Stage IB (cT1b, cN0(f), cM0, G3, ER+, PR-, HER2-) - Signed by GNicholas Lose MD on 10/08/2019   10/01/2019 Surgery   Right lumpectomy ([facility]): IDC, grade 3, 2.8cm, clear margins, 7 right axillary lymph nodes negative for carcinoma.   10/23/2019 Oncotype testing   Oncotype DX recurrence score 68: Greater than 39% risk of distant recurrence at 9 years   11/13/2019 -  Chemotherapy   The patient had palonosetron (ALOXI) injection 0.25 mg, 0.25 mg, Intravenous,  Once, 1 of 6 cycles Administration: 0.25 mg (11/13/2019) methotrexate (PF) chemo injection 84 mg, 40 mg/m2 = 84 mg, Intravenous,  Once, 1 of 6 cycles Administration: 84 mg (11/13/2019) cyclophosphamide (CYTOXAN) 1,260 mg in sodium chloride 0.9 % 250 mL chemo infusion, 600 mg/m2 = 1,260 mg, Intravenous,  Once, 1 of 6 cycles Administration: 1,260 mg (11/13/2019) fluorouracil (ADRUCIL) chemo injection 1,250 mg, 600 mg/m2 = 1,250 mg, Intravenous,  Once, 1 of 6 cycles Administration: 1,250 mg (11/13/2019)  for  chemotherapy treatment.      CHIEF COMPLIANT: Cycle 2 CMF  INTERVAL HISTORY: [name] a 69y.o. with above-mentioned history of right breast cancer who underwent a right lumpectomyand is currently onadjuvant chemotherapywith CMF.She presents to the clinic todayfor a toxicity check and cycle 2.    ALLERGIES:  is allergic to ceftin [cefuroxime] and zofran [ondansetron].  MEDICATIONS:  Current Outpatient Medications  Medication Sig Dispense Refill  . ALPRAZolam (XANAX) 0.5 MG tablet Take 0.25-0.5 mg by mouth 2 (two) times daily as needed. For anxiety    . amLODipine-benazepril (LOTREL) 10-20 MG per capsule Take 1 capsule by mouth daily.    . cholecalciferol (VITAMIN D) 400 UNITS TABS Take 400 Units by mouth daily.    . eszopiclone (LUNESTA) 2 MG TABS Take 3 mg by mouth at bedtime as needed for sleep. Take immediately before bedtime    . hydrochlorothiazide (HYDRODIURIL) 25 MG tablet Take 25 mg by mouth daily.    .Marland Kitchenibuprofen (ADVIL) 800 MG tablet Take 1 tablet (800 mg total) by mouth every 8 (eight) hours as needed. 30 tablet 0  . lidocaine-prilocaine (EMLA) cream Apply to affected area once 30 g 3  . omeprazole (PRILOSEC) 20 MG capsule Take 1 capsule (20 mg total) by mouth daily.    . rosuvastatin (CRESTOR) 10 MG tablet     . sertraline (ZOLOFT) 100 MG tablet Take 200 mg by mouth daily.     No current facility-administered medications for this visit.    PHYSICAL EXAMINATION: ECOG PERFORMANCE STATUS: 1 - Symptomatic but completely ambulatory  Vitals:   12/04/19 0850  BP: 120/80  Pulse: 83  Resp: 17  Temp: (!) 97.2 F (36.2 C)  SpO2: 99%   Filed Weights   12/04/19 0850  Weight: 220 lb 1.6 oz (99.8 kg)     LABORATORY DATA:  I have reviewed the data as listed CMP Latest Ref Rng & Units 11/21/2019 11/13/2019 11/08/2019  Glucose 70 - 99 mg/dL 112(H) 117(H) 105(H)  BUN 8 - 23 mg/dL '16 13 14  ' Creatinine 0.44 - 1.00 mg/dL 0.92 0.89 0.99  Sodium 135 - 145 mmol/L 137  137 140  Potassium 3.5 - 5.1 mmol/L 3.2(L) 3.5 4.0  Chloride 98 - 111 mmol/L 102 101 102  CO2 22 - 32 mmol/L '27 27 29  ' Calcium 8.9 - 10.3 mg/dL 9.1 9.3 9.5  Total Protein 6.5 - 8.1 g/dL 7.8 7.7 7.9  Total Bilirubin 0.3 - 1.2 mg/dL 0.4 0.4 0.8  Alkaline Phos 38 - 126 U/L 112 102 101  AST 15 - 41 U/L 30 27 35  ALT 0 - 44 U/L '23 20 22    ' Lab Results  Component Value Date   WBC 3.3 (L) 12/04/2019   HGB 10.0 (L) 12/04/2019   HCT 32.3 (L) 12/04/2019   MCV 84.3 12/04/2019   PLT 265 12/04/2019   NEUTROABS 1.1 (L) 12/04/2019    ASSESSMENT & PLAN:  Malignant neoplasm of upper-outer quadrant of right breast in female, estrogen receptor positive ([place]) 10/01/2019:Right lumpectomy ([facility]): IDC, grade 3, 2.8cm, clear margins, 7 right axillary lymph nodes negative for carcinoma.ER 90%, PR 0%, Ki-67 85%, HER-2 negative T2N0 stage Ib Oncotype DX recurrence score 68, greater than 39% risk of distant recurrence at 9 years  Treatment plan: 1.Adjuvant chemotherapy withCMF(chosen because of PS issues) 2. Adjuvant radiation therapy followed by 3. Adjuvant antiestrogen therapy  CT of the chest abdomen and pelvis: 09/30/2019: Right breast lesion, internal mammary lymph nodes, upper abdominal nodal enlargement (probably related to liver cirrhosis) small lung nodules ------------------------------------------------------------------------------------------------------------------------------------------ Current treatment:Cycle2 CMF Chemo toxicities: 1.  Patient has chronic nausea even before chemo.  She tried Compazine and felt that it caused more nausea and therefore she discontinued it. 2. anemia: Monitoring closely. 3.  Leukopenia: ANC today is 1.1: I will add injection appointment on day 3 for Udenyca.  We will need to get insurance authorization for this.  Without the booster injection, she is unlikely to remain on chemo. Given her comorbidities I believe that she is at high risk for  infection as well. Okay to treat today with an ANC of 1.1.  Return to clinic in 3 weeks for cycle 3    No orders of the defined types were placed in this encounter.  The patient has a good understanding of the overall plan. she agrees with it. she will call with any problems that may develop before the next visit here.  Total time spent: 30 mins including face to face time and time spent for planning, charting and coordination of care  Nicholas Lose, MD 12/04/2019  I, Cloyde Reams Dorshimer, am acting as scribe for Dr. Nicholas Lose.  I have reviewed the above documentation for accuracy and completeness, and I agree with the above.

## 2019-12-04 ENCOUNTER — Inpatient Hospital Stay: Payer: Medicare PPO

## 2019-12-04 ENCOUNTER — Inpatient Hospital Stay: Payer: Medicare PPO | Admitting: Hematology and Oncology

## 2019-12-04 ENCOUNTER — Encounter: Payer: Self-pay | Admitting: *Deleted

## 2019-12-04 ENCOUNTER — Other Ambulatory Visit: Payer: Self-pay

## 2019-12-04 DIAGNOSIS — Z170 Estrogen receptor positive status [ER+]: Secondary | ICD-10-CM

## 2019-12-04 DIAGNOSIS — C50411 Malignant neoplasm of upper-outer quadrant of right female breast: Secondary | ICD-10-CM

## 2019-12-04 DIAGNOSIS — Z5111 Encounter for antineoplastic chemotherapy: Secondary | ICD-10-CM | POA: Diagnosis not present

## 2019-12-04 DIAGNOSIS — Z95828 Presence of other vascular implants and grafts: Secondary | ICD-10-CM

## 2019-12-04 LAB — CMP (CANCER CENTER ONLY)
ALT: 16 U/L (ref 0–44)
AST: 27 U/L (ref 15–41)
Albumin: 3.5 g/dL (ref 3.5–5.0)
Alkaline Phosphatase: 95 U/L (ref 38–126)
Anion gap: 9 (ref 5–15)
BUN: 15 mg/dL (ref 8–23)
CO2: 25 mmol/L (ref 22–32)
Calcium: 9.2 mg/dL (ref 8.9–10.3)
Chloride: 105 mmol/L (ref 98–111)
Creatinine: 1.01 mg/dL — ABNORMAL HIGH (ref 0.44–1.00)
GFR, Estimated: 57 mL/min — ABNORMAL LOW (ref >60)
Glucose, Bld: 101 mg/dL — ABNORMAL HIGH (ref 70–99)
Potassium: 3.4 mmol/L — ABNORMAL LOW (ref 3.5–5.1)
Sodium: 139 mmol/L (ref 135–145)
Total Bilirubin: <0.2 mg/dL — ABNORMAL LOW (ref 0.3–1.2)
Total Protein: 7.1 g/dL (ref 6.5–8.1)

## 2019-12-04 LAB — CBC WITH DIFFERENTIAL (CANCER CENTER ONLY)
Abs Immature Granulocytes: 0 10*3/uL (ref 0.00–0.07)
Basophils Absolute: 0 10*3/uL (ref 0.0–0.1)
Basophils Relative: 1 %
Eosinophils Absolute: 0.2 10*3/uL (ref 0.0–0.5)
Eosinophils Relative: 6 %
HCT: 32.3 % — ABNORMAL LOW (ref 36.0–46.0)
Hemoglobin: 10 g/dL — ABNORMAL LOW (ref 12.0–15.0)
Immature Granulocytes: 0 %
Lymphocytes Relative: 43 %
Lymphs Abs: 1.4 10*3/uL (ref 0.7–4.0)
MCH: 26.1 pg (ref 26.0–34.0)
MCHC: 31 g/dL (ref 30.0–36.0)
MCV: 84.3 fL (ref 80.0–100.0)
Monocytes Absolute: 0.5 10*3/uL (ref 0.1–1.0)
Monocytes Relative: 16 %
Neutro Abs: 1.1 10*3/uL — ABNORMAL LOW (ref 1.7–7.7)
Neutrophils Relative %: 34 %
Platelet Count: 265 10*3/uL (ref 150–400)
RBC: 3.83 MIL/uL — ABNORMAL LOW (ref 3.87–5.11)
RDW: 18.1 % — ABNORMAL HIGH (ref 11.5–15.5)
WBC Count: 3.3 10*3/uL — ABNORMAL LOW (ref 4.0–10.5)
nRBC: 0 % (ref 0.0–0.2)

## 2019-12-04 MED ORDER — SODIUM CHLORIDE 0.9 % IV SOLN
10.0000 mg | Freq: Once | INTRAVENOUS | Status: AC
  Administered 2019-12-04: 10 mg via INTRAVENOUS
  Filled 2019-12-04: qty 10

## 2019-12-04 MED ORDER — FLUOROURACIL CHEMO INJECTION 2.5 GM/50ML
600.0000 mg/m2 | Freq: Once | INTRAVENOUS | Status: AC
  Administered 2019-12-04: 1250 mg via INTRAVENOUS
  Filled 2019-12-04: qty 25

## 2019-12-04 MED ORDER — PALONOSETRON HCL INJECTION 0.25 MG/5ML
0.2500 mg | Freq: Once | INTRAVENOUS | Status: AC
  Administered 2019-12-04: 0.25 mg via INTRAVENOUS

## 2019-12-04 MED ORDER — SODIUM CHLORIDE 0.9 % IV SOLN
Freq: Once | INTRAVENOUS | Status: AC
  Filled 2019-12-04: qty 250

## 2019-12-04 MED ORDER — PALONOSETRON HCL INJECTION 0.25 MG/5ML
INTRAVENOUS | Status: AC
  Filled 2019-12-04: qty 5

## 2019-12-04 MED ORDER — ONDANSETRON HCL 4 MG/2ML IJ SOLN
INTRAMUSCULAR | Status: AC
  Filled 2019-12-04: qty 4

## 2019-12-04 MED ORDER — METHOTREXATE SODIUM (PF) CHEMO INJECTION 250 MG/10ML
40.0000 mg/m2 | Freq: Once | INTRAMUSCULAR | Status: AC
  Administered 2019-12-04: 84 mg via INTRAVENOUS
  Filled 2019-12-04: qty 3.36

## 2019-12-04 MED ORDER — SODIUM CHLORIDE 0.9% FLUSH
10.0000 mL | INTRAVENOUS | Status: DC | PRN
  Administered 2019-12-04: 10 mL
  Filled 2019-12-04: qty 10

## 2019-12-04 MED ORDER — FAMOTIDINE IN NACL 20-0.9 MG/50ML-% IV SOLN
INTRAVENOUS | Status: AC
  Filled 2019-12-04: qty 50

## 2019-12-04 MED ORDER — HEPARIN SOD (PORK) LOCK FLUSH 100 UNIT/ML IV SOLN
500.0000 [IU] | Freq: Once | INTRAVENOUS | Status: AC | PRN
  Administered 2019-12-04: 500 [IU]
  Filled 2019-12-04: qty 5

## 2019-12-04 MED ORDER — SODIUM CHLORIDE 0.9 % IV SOLN
600.0000 mg/m2 | Freq: Once | INTRAVENOUS | Status: AC
  Administered 2019-12-04: 1260 mg via INTRAVENOUS
  Filled 2019-12-04: qty 63

## 2019-12-04 MED ORDER — DIPHENHYDRAMINE HCL 50 MG/ML IJ SOLN
INTRAMUSCULAR | Status: AC
  Filled 2019-12-04: qty 1

## 2019-12-04 NOTE — Patient Instructions (Signed)
[facility] Discharge Instructions for Patients Receiving Chemotherapy  Today you received the following chemotherapy agents: cyclophosphamide, methotrexate, fluorouracil  To help prevent nausea and vomiting after your treatment, we encourage you to take your nausea medication as needed.   If you develop nausea and vomiting that is not controlled by your nausea medication, call the clinic.   BELOW ARE SYMPTOMS THAT SHOULD BE REPORTED IMMEDIATELY:  *FEVER GREATER THAN 100.5 F  *CHILLS WITH OR WITHOUT FEVER  NAUSEA AND VOMITING THAT IS NOT CONTROLLED WITH YOUR NAUSEA MEDICATION  *UNUSUAL SHORTNESS OF BREATH  *UNUSUAL BRUISING OR BLEEDING  TENDERNESS IN MOUTH AND THROAT WITH OR WITHOUT PRESENCE OF ULCERS  *URINARY PROBLEMS  *BOWEL PROBLEMS  UNUSUAL RASH Items with * indicate a potential emergency and should be followed up as soon as possible.  Feel free to call the clinic should you have any questions or concerns. The clinic phone number is [phone].  Please show the [place] [facility] at check-in to the Emergency Department and triage nurse.

## 2019-12-04 NOTE — Progress Notes (Signed)
OK to treat Per Dr. Geralyn Flash note from today:  "3.  Leukopenia: ANC today is 1.1: I will add injection appointment on day 3 for Udenyca.  We will need to get insurance authorization for this.  Without the booster injection, she is unlikely to remain on chemo. Given her comorbidities I believe that she is at high risk for infection as well. Okay to treat today with an ANC of 1.1."

## 2019-12-04 NOTE — Assessment & Plan Note (Signed)
10/01/2019:Right lumpectomy ([facility]): IDC, grade 3, 2.8cm, clear margins, 7 right axillary lymph nodes negative for carcinoma.ER 90%, PR 0%, Ki-67 85%, HER-2 negative T2N0 stage Ib Oncotype DX recurrence score 68, greater than 39% risk of distant recurrence at 9 years  Treatment plan: 1.Adjuvant chemotherapy withCMF(chosen because of PS issues) 2. Adjuvant radiation therapy followed by 3. Adjuvant antiestrogen therapy  CT of the chest abdomen and pelvis: 09/30/2019: Right breast lesion, internal mammary lymph nodes, upper abdominal nodal enlargement (probably related to liver cirrhosis) small lung nodules ------------------------------------------------------------------------------------------------------------------------------------------ Current treatment:Cycle2 CMF Chemo toxicities: 1.  Patient has chronic nausea even before chemo.  She tried Compazine and felt that it caused more nausea and therefore she discontinued it. 2. anemia: Monitoring closely. Overall she tolerated chemotherapy extremely well.  Encouraged to continue to drink enough water and eat more protein and cut down on junk food.  Return to clinic in 3 weeks for cycle 3

## 2019-12-04 NOTE — Patient Instructions (Signed)

## 2019-12-05 ENCOUNTER — Telehealth: Payer: Self-pay | Admitting: Hematology and Oncology

## 2019-12-05 NOTE — Telephone Encounter (Signed)
Scheduled per 10/20. Called and left a msg

## 2019-12-06 ENCOUNTER — Inpatient Hospital Stay: Payer: Medicare PPO

## 2019-12-06 ENCOUNTER — Ambulatory Visit: Payer: Medicare PPO

## 2019-12-06 ENCOUNTER — Other Ambulatory Visit: Payer: Self-pay

## 2019-12-06 VITALS — BP 130/81 | HR 76 | Temp 98.6°F | Resp 18

## 2019-12-06 DIAGNOSIS — Z170 Estrogen receptor positive status [ER+]: Secondary | ICD-10-CM

## 2019-12-06 DIAGNOSIS — Z5111 Encounter for antineoplastic chemotherapy: Secondary | ICD-10-CM | POA: Diagnosis not present

## 2019-12-06 DIAGNOSIS — C50411 Malignant neoplasm of upper-outer quadrant of right female breast: Secondary | ICD-10-CM

## 2019-12-06 MED ORDER — PEGFILGRASTIM-CBQV 6 MG/0.6ML ~~LOC~~ SOSY
6.0000 mg | PREFILLED_SYRINGE | Freq: Once | SUBCUTANEOUS | Status: AC
  Administered 2019-12-06: 6 mg via SUBCUTANEOUS

## 2019-12-06 MED ORDER — PEGFILGRASTIM-CBQV 6 MG/0.6ML ~~LOC~~ SOSY
PREFILLED_SYRINGE | SUBCUTANEOUS | Status: AC
  Filled 2019-12-06: qty 0.6

## 2019-12-06 NOTE — Patient Instructions (Signed)

## 2019-12-24 NOTE — Progress Notes (Signed)
Patient Care Team: Everardo Beals, NP as PCP - General Mauro Kaufmann, RN as Oncology Nurse Navigator Rockwell Germany, RN as Oncology Nurse Navigator  DIAGNOSIS:    ICD-10-CM   1. Malignant neoplasm of upper-outer quadrant of right breast in female, estrogen receptor positive ([place])  C50.411    Z17.0     SUMMARY OF ONCOLOGIC HISTORY: Oncology History  Malignant neoplasm of upper-outer quadrant of right breast in female, estrogen receptor positive ([place])  09/17/2019 Initial Diagnosis   Screening mammogram detected a right breast mass, not palpable on exam. Diagnostic mammogram showed 1.0cm mass at the 12:30 position in the right breast, with a mildly abnormal right axillary lymph node, 4.76mm. Biopsy showed IDC in the breast, grade 3, HER-2 negative (1+), ER+ 70%, PR- 0%, Ki67 85%, and the lymph node negative for carcinoma.   09/24/2019 Cancer Staging   Staging form: Breast, AJCC 8th Edition - Clinical stage from 09/24/2019: Stage IB (cT1b, cN0(f), cM0, G3, ER+, PR-, HER2-) - Signed by Nicholas Lose, MD on 10/08/2019   10/01/2019 Surgery   Right lumpectomy ([facility]): IDC, grade 3, 2.8cm, clear margins, 7 right axillary lymph nodes negative for carcinoma.   10/23/2019 Oncotype testing   Oncotype DX recurrence score 68: Greater than 39% risk of distant recurrence at 9 years   11/13/2019 -  Chemotherapy   The patient had palonosetron (ALOXI) injection 0.25 mg, 0.25 mg, Intravenous,  Once, 2 of 6 cycles Administration: 0.25 mg (11/13/2019), 0.25 mg (12/04/2019) methotrexate (PF) chemo injection 84 mg, 40 mg/m2 = 84 mg, Intravenous,  Once, 2 of 6 cycles Administration: 84 mg (11/13/2019), 84 mg (12/04/2019) pegfilgrastim-cbqv (UDENYCA) injection 6 mg, 6 mg, Subcutaneous, Once, 1 of 5 cycles Administration: 6 mg (12/06/2019) cyclophosphamide (CYTOXAN) 1,260 mg in sodium chloride 0.9 % 250 mL chemo infusion, 600 mg/m2 = 1,260 mg, Intravenous,  Once, 2 of 6 cycles Administration: 1,260 mg  (11/13/2019), 1,260 mg (12/04/2019) fluorouracil (ADRUCIL) chemo injection 1,250 mg, 600 mg/m2 = 1,250 mg, Intravenous,  Once, 2 of 6 cycles Administration: 1,250 mg (11/13/2019), 1,250 mg (12/04/2019)  for chemotherapy treatment.      CHIEF COMPLIANT: Cycle 3CMF  INTERVAL HISTORY: [name] is a 69 y.o. with above-mentioned history of right breast cancer who underwent a right lumpectomyand is currently onadjuvant chemotherapywith CMF.She presents to the clinic todayfora toxicity check andcycle 3.  After the Neulasta injection she felt poorly for about 4 to 5 days.  She had nausea and diarrhea and diffuse aches and pains.  She recovered from all of that and is doing quite better.  ALLERGIES:  is allergic to ceftin [cefuroxime] and zofran [ondansetron].  MEDICATIONS:  Current Outpatient Medications  Medication Sig Dispense Refill  . ALPRAZolam (XANAX) 0.5 MG tablet Take 0.25-0.5 mg by mouth 2 (two) times daily as needed. For anxiety    . amLODipine-benazepril (LOTREL) 10-20 MG per capsule Take 1 capsule by mouth daily.    . cholecalciferol (VITAMIN D) 400 UNITS TABS Take 400 Units by mouth daily.    . eszopiclone (LUNESTA) 2 MG TABS Take 3 mg by mouth at bedtime as needed for sleep. Take immediately before bedtime    . hydrochlorothiazide (HYDRODIURIL) 25 MG tablet Take 25 mg by mouth daily.    Marland Kitchen ibuprofen (ADVIL) 800 MG tablet Take 1 tablet (800 mg total) by mouth every 8 (eight) hours as needed. 30 tablet 0  . lidocaine-prilocaine (EMLA) cream Apply to affected area once 30 g 3  . omeprazole (PRILOSEC) 20 MG  capsule Take 1 capsule (20 mg total) by mouth daily.    . rosuvastatin (CRESTOR) 10 MG tablet     . sertraline (ZOLOFT) 100 MG tablet Take 200 mg by mouth daily.     No current facility-administered medications for this visit.   Facility-Administered Medications Ordered in Other Visits  Medication Dose Route Frequency Provider Last Rate Last Admin  . sodium chloride  flush (NS) 0.9 % injection 10 mL  10 mL Intracatheter PRN Nicholas Lose, MD   10 mL at 12/25/19 0805    PHYSICAL EXAMINATION: ECOG PERFORMANCE STATUS: 1 - Symptomatic but completely ambulatory  There were no vitals filed for this visit. There were no vitals filed for this visit.   LABORATORY DATA:  I have reviewed the data as listed CMP Latest Ref Rng & Units 12/04/2019 11/21/2019 11/13/2019  Glucose 70 - 99 mg/dL 101(H) 112(H) 117(H)  BUN 8 - 23 mg/dL $Remove'15 16 13  'OiaUPMb$ Creatinine 0.44 - 1.00 mg/dL 1.01(H) 0.92 0.89  Sodium 135 - 145 mmol/L 139 137 137  Potassium 3.5 - 5.1 mmol/L 3.4(L) 3.2(L) 3.5  Chloride 98 - 111 mmol/L 105 102 101  CO2 22 - 32 mmol/L $RemoveB'25 27 27  'mfqXAdzk$ Calcium 8.9 - 10.3 mg/dL 9.2 9.1 9.3  Total Protein 6.5 - 8.1 g/dL 7.1 7.8 7.7  Total Bilirubin 0.3 - 1.2 mg/dL <0.2(L) 0.4 0.4  Alkaline Phos 38 - 126 U/L 95 112 102  AST 15 - 41 U/L $Remo'27 30 27  'gUxta$ ALT 0 - 44 U/L $Remo'16 23 20    'fIrMo$ Lab Results  Component Value Date   WBC 7.1 12/25/2019   HGB 9.6 (L) 12/25/2019   HCT 30.9 (L) 12/25/2019   MCV 84.2 12/25/2019   PLT 207 12/25/2019   NEUTROABS 4.6 12/25/2019    ASSESSMENT & PLAN:  Malignant neoplasm of upper-outer quadrant of right breast in female, estrogen receptor positive ([place]) 10/01/2019:Right lumpectomy ([facility]): IDC, grade 3, 2.8cm, clear margins, 7 right axillary lymph nodes negative for carcinoma.ER 90%, PR 0%, Ki-67 85%, HER-2 negative T2N0 stage Ib Oncotype DX recurrence score 68, greater than 39% risk of distant recurrence at 9 years  Treatment plan: 1.Adjuvant chemotherapy withCMF(chosen because of PS issues) 2. Adjuvant radiation therapy followed by 3. Adjuvant antiestrogen therapy  CT of the chest abdomen and pelvis: 09/30/2019: Right breast lesion, internal mammary lymph nodes, upper abdominal nodal enlargement (probably related to liver cirrhosis) small lung  nodules ------------------------------------------------------------------------------------------------------------------------------------------ Current treatment:Cycle3 CMF Chemo toxicities: 1.Patient has chronic nausea even before chemo. She tried Compazine and felt that it caused more nausea and therefore she discontinued it. 2.anemia: Monitoring closely. 3.  Leukopenia: Improved after giving Neulasta injection. 4.  Neulasta related adverse effects: Patient's pain nausea vomiting diarrhea and bone pain after receiving Neulasta shot.  Return to clinic in 3 weeks for cycle 4 She wanted to know if only 6 treatments are planned or will she need more chemo.  We will make a decision on 6 versus 8 CMF treatments after she completes with all 6.    No orders of the defined types were placed in this encounter.  The patient has a good understanding of the overall plan. she agrees with it. she will call with any problems that may develop before the next visit here.  Total time spent: 30 mins including face to face time and time spent for planning, charting and coordination of care  Nicholas Lose, MD 12/25/2019  I, Cloyde Reams Dorshimer, am acting as scribe for Dr. Nicholas Lose.  I  have reviewed the above documentation for accuracy and completeness, and I agree with the above.

## 2019-12-25 ENCOUNTER — Inpatient Hospital Stay: Payer: Medicare PPO

## 2019-12-25 ENCOUNTER — Inpatient Hospital Stay: Payer: Medicare PPO | Admitting: Hematology and Oncology

## 2019-12-25 ENCOUNTER — Encounter: Payer: Self-pay | Admitting: *Deleted

## 2019-12-25 ENCOUNTER — Other Ambulatory Visit: Payer: Self-pay

## 2019-12-25 ENCOUNTER — Inpatient Hospital Stay: Payer: Medicare PPO | Attending: Hematology and Oncology

## 2019-12-25 DIAGNOSIS — D6481 Anemia due to antineoplastic chemotherapy: Secondary | ICD-10-CM | POA: Insufficient documentation

## 2019-12-25 DIAGNOSIS — Z5189 Encounter for other specified aftercare: Secondary | ICD-10-CM | POA: Insufficient documentation

## 2019-12-25 DIAGNOSIS — C50411 Malignant neoplasm of upper-outer quadrant of right female breast: Secondary | ICD-10-CM

## 2019-12-25 DIAGNOSIS — Z170 Estrogen receptor positive status [ER+]: Secondary | ICD-10-CM

## 2019-12-25 DIAGNOSIS — D701 Agranulocytosis secondary to cancer chemotherapy: Secondary | ICD-10-CM | POA: Diagnosis not present

## 2019-12-25 DIAGNOSIS — Z95828 Presence of other vascular implants and grafts: Secondary | ICD-10-CM

## 2019-12-25 DIAGNOSIS — Z5111 Encounter for antineoplastic chemotherapy: Secondary | ICD-10-CM | POA: Insufficient documentation

## 2019-12-25 LAB — CBC WITH DIFFERENTIAL (CANCER CENTER ONLY)
Abs Immature Granulocytes: 0.01 10*3/uL (ref 0.00–0.07)
Basophils Absolute: 0.1 10*3/uL (ref 0.0–0.1)
Basophils Relative: 1 %
Eosinophils Absolute: 0.2 10*3/uL (ref 0.0–0.5)
Eosinophils Relative: 2 %
HCT: 30.9 % — ABNORMAL LOW (ref 36.0–46.0)
Hemoglobin: 9.6 g/dL — ABNORMAL LOW (ref 12.0–15.0)
Immature Granulocytes: 0 %
Lymphocytes Relative: 23 %
Lymphs Abs: 1.6 10*3/uL (ref 0.7–4.0)
MCH: 26.2 pg (ref 26.0–34.0)
MCHC: 31.1 g/dL (ref 30.0–36.0)
MCV: 84.2 fL (ref 80.0–100.0)
Monocytes Absolute: 0.6 10*3/uL (ref 0.1–1.0)
Monocytes Relative: 8 %
Neutro Abs: 4.6 10*3/uL (ref 1.7–7.7)
Neutrophils Relative %: 66 %
Platelet Count: 207 10*3/uL (ref 150–400)
RBC: 3.67 MIL/uL — ABNORMAL LOW (ref 3.87–5.11)
RDW: 21.1 % — ABNORMAL HIGH (ref 11.5–15.5)
WBC Count: 7.1 10*3/uL (ref 4.0–10.5)
nRBC: 0 % (ref 0.0–0.2)

## 2019-12-25 LAB — CMP (CANCER CENTER ONLY)
ALT: 14 U/L (ref 0–44)
AST: 24 U/L (ref 15–41)
Albumin: 3.6 g/dL (ref 3.5–5.0)
Alkaline Phosphatase: 103 U/L (ref 38–126)
Anion gap: 10 (ref 5–15)
BUN: 14 mg/dL (ref 8–23)
CO2: 23 mmol/L (ref 22–32)
Calcium: 8.6 mg/dL — ABNORMAL LOW (ref 8.9–10.3)
Chloride: 111 mmol/L (ref 98–111)
Creatinine: 0.83 mg/dL (ref 0.44–1.00)
GFR, Estimated: >60 mL/min (ref >60)
Glucose, Bld: 105 mg/dL — ABNORMAL HIGH (ref 70–99)
Potassium: 4 mmol/L (ref 3.5–5.1)
Sodium: 144 mmol/L (ref 135–145)
Total Bilirubin: 0.4 mg/dL (ref 0.3–1.2)
Total Protein: 7.1 g/dL (ref 6.5–8.1)

## 2019-12-25 MED ORDER — PALONOSETRON HCL INJECTION 0.25 MG/5ML
INTRAVENOUS | Status: AC
  Filled 2019-12-25: qty 5

## 2019-12-25 MED ORDER — SODIUM CHLORIDE 0.9 % IV SOLN
Freq: Once | INTRAVENOUS | Status: AC
  Filled 2019-12-25: qty 250

## 2019-12-25 MED ORDER — ESZOPICLONE 3 MG PO TABS: 3.0000 mg | ORAL_TABLET | Freq: Every evening | ORAL | Status: AC | PRN

## 2019-12-25 MED ORDER — SODIUM CHLORIDE 0.9 % IV SOLN
10.0000 mg | Freq: Once | INTRAVENOUS | Status: AC
  Administered 2019-12-25: 10 mg via INTRAVENOUS
  Filled 2019-12-25: qty 10

## 2019-12-25 MED ORDER — SODIUM CHLORIDE 0.9% FLUSH
10.0000 mL | INTRAVENOUS | Status: DC | PRN
  Administered 2019-12-25: 10 mL
  Filled 2019-12-25: qty 10

## 2019-12-25 MED ORDER — PALONOSETRON HCL INJECTION 0.25 MG/5ML
0.2500 mg | Freq: Once | INTRAVENOUS | Status: AC
  Administered 2019-12-25: 0.25 mg via INTRAVENOUS

## 2019-12-25 MED ORDER — METHOTREXATE SODIUM (PF) CHEMO INJECTION 250 MG/10ML
40.0000 mg/m2 | Freq: Once | INTRAMUSCULAR | Status: AC
  Administered 2019-12-25: 84 mg via INTRAVENOUS
  Filled 2019-12-25: qty 3.36

## 2019-12-25 MED ORDER — FLUOROURACIL CHEMO INJECTION 2.5 GM/50ML
600.0000 mg/m2 | Freq: Once | INTRAVENOUS | Status: AC
  Administered 2019-12-25: 1250 mg via INTRAVENOUS
  Filled 2019-12-25: qty 25

## 2019-12-25 MED ORDER — SODIUM CHLORIDE 0.9 % IV SOLN
600.0000 mg/m2 | Freq: Once | INTRAVENOUS | Status: AC
  Administered 2019-12-25: 1260 mg via INTRAVENOUS
  Filled 2019-12-25: qty 63

## 2019-12-25 MED ORDER — HEPARIN SOD (PORK) LOCK FLUSH 100 UNIT/ML IV SOLN
500.0000 [IU] | Freq: Once | INTRAVENOUS | Status: AC | PRN
  Administered 2019-12-25: 500 [IU]
  Filled 2019-12-25: qty 5

## 2019-12-25 NOTE — Assessment & Plan Note (Signed)
10/01/2019:Right lumpectomy ([facility]): IDC, grade 3, 2.8cm, clear margins, 7 right axillary lymph nodes negative for carcinoma.ER 90%, PR 0%, Ki-67 85%, HER-2 negative T2N0 stage Ib Oncotype DX recurrence score 68, greater than 39% risk of distant recurrence at 9 years  Treatment plan: 1.Adjuvant chemotherapy withCMF(chosen because of PS issues) 2. Adjuvant radiation therapy followed by 3. Adjuvant antiestrogen therapy  CT of the chest abdomen and pelvis: 09/30/2019: Right breast lesion, internal mammary lymph nodes, upper abdominal nodal enlargement (probably related to liver cirrhosis) small lung nodules ------------------------------------------------------------------------------------------------------------------------------------------ Current treatment:Cycle3 CMF Chemo toxicities: 1.Patient has chronic nausea even before chemo. She tried Compazine and felt that it caused more nausea and therefore she discontinued it. 2.anemia: Monitoring closely. 3.  Leukopenia: ANC today is 1.1: I will add injection appointment on day 3 for Udenyca.  We will need to get insurance authorization for this.  Without the booster injection, she is unlikely to remain on chemo. Given her comorbidities I believe that she is at high risk for infection as well. Okay to treat today with an ANC of 1.1.  Return to clinic in 3 weeks for cycle 4

## 2019-12-25 NOTE — Patient Instructions (Signed)
[facility] Discharge Instructions for Patients Receiving Chemotherapy  Today you received the following chemotherapy agents: cyclophosphamide, methotrexate, fluorouracil  To help prevent nausea and vomiting after your treatment, we encourage you to take your nausea medication as needed.   If you develop nausea and vomiting that is not controlled by your nausea medication, call the clinic.   BELOW ARE SYMPTOMS THAT SHOULD BE REPORTED IMMEDIATELY:  *FEVER GREATER THAN 100.5 F  *CHILLS WITH OR WITHOUT FEVER  NAUSEA AND VOMITING THAT IS NOT CONTROLLED WITH YOUR NAUSEA MEDICATION  *UNUSUAL SHORTNESS OF BREATH  *UNUSUAL BRUISING OR BLEEDING  TENDERNESS IN MOUTH AND THROAT WITH OR WITHOUT PRESENCE OF ULCERS  *URINARY PROBLEMS  *BOWEL PROBLEMS  UNUSUAL RASH Items with * indicate a potential emergency and should be followed up as soon as possible.  Feel free to call the clinic should you have any questions or concerns. The clinic phone number is [phone].  Please show the [facility] at check-in to the Emergency Department and triage nurse.

## 2019-12-27 ENCOUNTER — Other Ambulatory Visit: Payer: Self-pay

## 2019-12-27 ENCOUNTER — Ambulatory Visit: Payer: Medicare PPO

## 2019-12-27 ENCOUNTER — Inpatient Hospital Stay: Payer: Medicare PPO

## 2019-12-27 DIAGNOSIS — C50411 Malignant neoplasm of upper-outer quadrant of right female breast: Secondary | ICD-10-CM

## 2019-12-27 DIAGNOSIS — Z5111 Encounter for antineoplastic chemotherapy: Secondary | ICD-10-CM | POA: Diagnosis not present

## 2019-12-27 DIAGNOSIS — Z170 Estrogen receptor positive status [ER+]: Secondary | ICD-10-CM

## 2019-12-27 MED ORDER — PEGFILGRASTIM-CBQV 6 MG/0.6ML ~~LOC~~ SOSY
6.0000 mg | PREFILLED_SYRINGE | Freq: Once | SUBCUTANEOUS | Status: AC
  Administered 2019-12-27: 6 mg via SUBCUTANEOUS

## 2019-12-27 MED ORDER — PEGFILGRASTIM-CBQV 6 MG/0.6ML ~~LOC~~ SOSY
PREFILLED_SYRINGE | SUBCUTANEOUS | Status: AC
  Filled 2019-12-27: qty 0.6

## 2019-12-27 NOTE — Patient Instructions (Signed)

## 2019-12-30 ENCOUNTER — Telehealth: Payer: Self-pay | Admitting: Hematology and Oncology

## 2019-12-30 NOTE — Telephone Encounter (Signed)
No 11/10 los, no changes made to pt schedule  

## 2020-01-01 ENCOUNTER — Encounter: Payer: Self-pay | Admitting: Hematology and Oncology

## 2020-01-01 NOTE — Progress Notes (Signed)
Patient's daughter York Cerise called to inquire about what is needed to apply for grant. Advised what is needed. She verbalized understanding and will provide on 01/15/20 at next appointment.  She has my card for any additional financial questions or concerns.

## 2020-01-14 NOTE — Progress Notes (Signed)
Patient Care Team: Everardo Beals, NP as PCP - General Mauro Kaufmann, RN as Oncology Nurse Navigator Rockwell Germany, RN as Oncology Nurse Navigator  DIAGNOSIS:    ICD-10-CM   1. Malignant neoplasm of upper-outer quadrant of right breast in female, estrogen receptor positive ([place])  C50.411    Z17.0     SUMMARY OF ONCOLOGIC HISTORY: Oncology History  Malignant neoplasm of upper-outer quadrant of right breast in female, estrogen receptor positive ([place])  09/17/2019 Initial Diagnosis   Screening mammogram detected a right breast mass, not palpable on exam. Diagnostic mammogram showed 1.0cm mass at the 12:30 position in the right breast, with a mildly abnormal right axillary lymph node, 4.52m. Biopsy showed IDC in the breast, grade 3, HER-2 negative (1+), ER+ 70%, PR- 0%, Ki67 85%, and the lymph node negative for carcinoma.   09/24/2019 Cancer Staging   Staging form: Breast, AJCC 8th Edition - Clinical stage from 09/24/2019: Stage IB (cT1b, cN0(f), cM0, G3, ER+, PR-, HER2-) - Signed by GNicholas Lose MD on 10/08/2019   10/01/2019 Surgery   Right lumpectomy ([facility]): IDC, grade 3, 2.8cm, clear margins, 7 right axillary lymph nodes negative for carcinoma.   10/23/2019 Oncotype testing   Oncotype DX recurrence score 68: Greater than 39% risk of distant recurrence at 9 years   11/13/2019 -  Chemotherapy   The patient had palonosetron (ALOXI) injection 0.25 mg, 0.25 mg, Intravenous,  Once, 3 of 6 cycles Administration: 0.25 mg (11/13/2019), 0.25 mg (12/04/2019), 0.25 mg (12/25/2019) methotrexate (PF) chemo injection 84 mg, 40 mg/m2 = 84 mg, Intravenous,  Once, 3 of 6 cycles Administration: 84 mg (11/13/2019), 84 mg (12/04/2019), 84 mg (12/25/2019) pegfilgrastim-cbqv (UDENYCA) injection 6 mg, 6 mg, Subcutaneous, Once, 2 of 5 cycles Administration: 6 mg (12/06/2019), 6 mg (12/27/2019) cyclophosphamide (CYTOXAN) 1,260 mg in sodium chloride 0.9 % 250 mL chemo infusion, 600 mg/m2 = 1,260 mg,  Intravenous,  Once, 3 of 6 cycles Administration: 1,260 mg (11/13/2019), 1,260 mg (12/04/2019), 1,260 mg (12/25/2019) fluorouracil (ADRUCIL) chemo injection 1,250 mg, 600 mg/m2 = 1,250 mg, Intravenous,  Once, 3 of 6 cycles Administration: 1,250 mg (11/13/2019), 1,250 mg (12/04/2019), 1,250 mg (12/25/2019)  for chemotherapy treatment.      CHIEF COMPLIANT: Cycle4CMF  INTERVAL HISTORY: [name] a 69y.o. with above-mentioned history of right breast cancer who underwent a right lumpectomyand is currently onadjuvant chemotherapywith CMF.She presents to the clinic todayfora toxicity checkandcycle4.   She reports that day 4 today 8 she gets nausea vomiting and diarrhea.  She gets better after that and has a pretty good rest of the time.  She denies any mouth sores.  I do not think she takes the nausea medication.  She tells me that they do not work for her.   ALLERGIES:  is allergic to ceftin [cefuroxime] and zofran [ondansetron].  MEDICATIONS:  Current Outpatient Medications  Medication Sig Dispense Refill  . ALPRAZolam (XANAX) 0.5 MG tablet Take 0.25-0.5 mg by mouth 2 (two) times daily as needed. For anxiety    . amLODipine-benazepril (LOTREL) 10-20 MG per capsule Take 1 capsule by mouth daily.    . cholecalciferol (VITAMIN D) 400 UNITS TABS Take 400 Units by mouth daily.    . eszopiclone 3 MG TABS Take 1 tablet (3 mg total) by mouth at bedtime as needed for sleep. Take immediately before bedtime    . hydrochlorothiazide (HYDRODIURIL) 25 MG tablet Take 25 mg by mouth daily.    .Marland Kitchenibuprofen (ADVIL) 800 MG tablet Take  1 tablet (800 mg total) by mouth every 8 (eight) hours as needed. 30 tablet 0  . lidocaine-prilocaine (EMLA) cream Apply to affected area once 30 g 3  . omeprazole (PRILOSEC) 20 MG capsule Take 1 capsule (20 mg total) by mouth daily.    . rosuvastatin (CRESTOR) 10 MG tablet     . sertraline (ZOLOFT) 100 MG tablet Take 200 mg by mouth daily.     No current  facility-administered medications for this visit.    PHYSICAL EXAMINATION: ECOG PERFORMANCE STATUS: 1 - Symptomatic but completely ambulatory  Vitals:   01/15/20 1218  BP: 122/81  Pulse: 87  Resp: 16  Temp: 98 F (36.7 C)  SpO2: 100%   Filed Weights   01/15/20 1218  Weight: 211 lb 9.6 oz (96 kg)    LABORATORY DATA:  I have reviewed the data as listed CMP Latest Ref Rng & Units 12/25/2019 12/04/2019 11/21/2019  Glucose 70 - 99 mg/dL 105(H) 101(H) 112(H)  BUN 8 - 23 mg/dL '14 15 16  ' Creatinine 0.44 - 1.00 mg/dL 0.83 1.01(H) 0.92  Sodium 135 - 145 mmol/L 144 139 137  Potassium 3.5 - 5.1 mmol/L 4.0 3.4(L) 3.2(L)  Chloride 98 - 111 mmol/L 111 105 102  CO2 22 - 32 mmol/L '23 25 27  ' Calcium 8.9 - 10.3 mg/dL 8.6(L) 9.2 9.1  Total Protein 6.5 - 8.1 g/dL 7.1 7.1 7.8  Total Bilirubin 0.3 - 1.2 mg/dL 0.4 <0.2(L) 0.4  Alkaline Phos 38 - 126 U/L 103 95 112  AST 15 - 41 U/L '24 27 30  ' ALT 0 - 44 U/L '14 16 23    ' Lab Results  Component Value Date   WBC 7.1 12/25/2019   HGB 9.6 (L) 12/25/2019   HCT 30.9 (L) 12/25/2019   MCV 84.2 12/25/2019   PLT 207 12/25/2019   NEUTROABS 4.6 12/25/2019    ASSESSMENT & PLAN:  Malignant neoplasm of upper-outer quadrant of right breast in female, estrogen receptor positive ([place]) 10/01/2019:Right lumpectomy ([facility]): IDC, grade 3, 2.8cm, clear margins, 7 right axillary lymph nodes negative for carcinoma.ER 90%, PR 0%, Ki-67 85%, HER-2 negative T2N0 stage Ib Oncotype DX recurrence score 68, greater than 39% risk of distant recurrence at 9 years  Treatment plan: 1.Adjuvant chemotherapy withCMF(chosen because of PS issues) 2. Adjuvant radiation therapy followed by 3. Adjuvant antiestrogen therapy  CT of the chest abdomen and pelvis: 09/30/2019: Right breast lesion, internal mammary lymph nodes, upper abdominal nodal enlargement (probably related to liver cirrhosis) small lung  nodules ------------------------------------------------------------------------------------------------------------------------------------------ Current treatment:Cycle4CMF Chemo toxicities: 1.Nausea and vomiting with chemo: I added Namenda to her treatment plan.Marland Kitchen 2.anemia: Monitoring closely. 3.Leukopenia: Improved after giving Neulasta injection. 4.  Neulasta related adverse effects: Patient's pain nausea vomiting diarrhea and bone pain after receiving Neulasta shot.  Return to clinic in3weeks for cycle 5 Our plan is to give her 6 cycles of CMF.  No orders of the defined types were placed in this encounter.  The patient has a good understanding of the overall plan. she agrees with it. she will call with any problems that may develop before the next visit here.  Total time spent: 30 mins including face to face time and time spent for planning, charting and coordination of care  Nicholas Lose, MD 01/15/2020  I, Cloyde Reams Dorshimer, am acting as scribe for Dr. Nicholas Lose.  I have reviewed the above documentation for accuracy and completeness, and I agree with the above.

## 2020-01-14 NOTE — Assessment & Plan Note (Signed)
10/01/2019:Right lumpectomy ([facility]): IDC, grade 3, 2.8cm, clear margins, 7 right axillary lymph nodes negative for carcinoma.ER 90%, PR 0%, Ki-67 85%, HER-2 negative T2N0 stage Ib Oncotype DX recurrence score 68, greater than 39% risk of distant recurrence at 9 years  Treatment plan: 1.Adjuvant chemotherapy withCMF(chosen because of PS issues) 2. Adjuvant radiation therapy followed by 3. Adjuvant antiestrogen therapy  CT of the chest abdomen and pelvis: 09/30/2019: Right breast lesion, internal mammary lymph nodes, upper abdominal nodal enlargement (probably related to liver cirrhosis) small lung nodules ------------------------------------------------------------------------------------------------------------------------------------------ Current treatment:Cycle4CMF Chemo toxicities: 1.Patient has chronic nausea even before chemo. She tried Compazine and felt that it caused more nausea and therefore she discontinued it. 2.anemia: Monitoring closely. 3.Leukopenia: Improved after giving Neulasta injection. 4.  Neulasta related adverse effects: Patient's pain nausea vomiting diarrhea and bone pain after receiving Neulasta shot.  Return to clinic in3weeks for cycle 5 She wanted to know if only 6 treatments are planned or will she need more chemo.  We will make a decision on 6 versus 8 CMF treatments after she completes with all 6.

## 2020-01-15 ENCOUNTER — Encounter: Payer: Self-pay | Admitting: Hematology and Oncology

## 2020-01-15 ENCOUNTER — Inpatient Hospital Stay: Payer: Medicare PPO | Attending: Hematology and Oncology

## 2020-01-15 ENCOUNTER — Inpatient Hospital Stay: Payer: Medicare PPO

## 2020-01-15 ENCOUNTER — Inpatient Hospital Stay: Payer: Medicare PPO | Admitting: Hematology and Oncology

## 2020-01-15 ENCOUNTER — Other Ambulatory Visit: Payer: Self-pay

## 2020-01-15 DIAGNOSIS — Z5189 Encounter for other specified aftercare: Secondary | ICD-10-CM | POA: Insufficient documentation

## 2020-01-15 DIAGNOSIS — C50411 Malignant neoplasm of upper-outer quadrant of right female breast: Secondary | ICD-10-CM

## 2020-01-15 DIAGNOSIS — Z95828 Presence of other vascular implants and grafts: Secondary | ICD-10-CM

## 2020-01-15 DIAGNOSIS — Z170 Estrogen receptor positive status [ER+]: Secondary | ICD-10-CM | POA: Diagnosis not present

## 2020-01-15 DIAGNOSIS — D6481 Anemia due to antineoplastic chemotherapy: Secondary | ICD-10-CM | POA: Insufficient documentation

## 2020-01-15 DIAGNOSIS — Z5111 Encounter for antineoplastic chemotherapy: Secondary | ICD-10-CM | POA: Insufficient documentation

## 2020-01-15 LAB — CBC WITH DIFFERENTIAL (CANCER CENTER ONLY)
Abs Immature Granulocytes: 0.02 10*3/uL (ref 0.00–0.07)
Basophils Absolute: 0.1 10*3/uL (ref 0.0–0.1)
Basophils Relative: 1 %
Eosinophils Absolute: 0.2 10*3/uL (ref 0.0–0.5)
Eosinophils Relative: 2 %
HCT: 35.1 % — ABNORMAL LOW (ref 36.0–46.0)
Hemoglobin: 11.1 g/dL — ABNORMAL LOW (ref 12.0–15.0)
Immature Granulocytes: 0 %
Lymphocytes Relative: 18 %
Lymphs Abs: 1.3 10*3/uL (ref 0.7–4.0)
MCH: 27.1 pg (ref 26.0–34.0)
MCHC: 31.6 g/dL (ref 30.0–36.0)
MCV: 85.6 fL (ref 80.0–100.0)
Monocytes Absolute: 0.6 10*3/uL (ref 0.1–1.0)
Monocytes Relative: 8 %
Neutro Abs: 5.1 10*3/uL (ref 1.7–7.7)
Neutrophils Relative %: 71 %
Platelet Count: 223 10*3/uL (ref 150–400)
RBC: 4.1 MIL/uL (ref 3.87–5.11)
RDW: 21.8 % — ABNORMAL HIGH (ref 11.5–15.5)
WBC Count: 7.2 10*3/uL (ref 4.0–10.5)
nRBC: 0 % (ref 0.0–0.2)

## 2020-01-15 LAB — CMP (CANCER CENTER ONLY)
ALT: 17 U/L (ref 0–44)
AST: 29 U/L (ref 15–41)
Albumin: 4 g/dL (ref 3.5–5.0)
Alkaline Phosphatase: 122 U/L (ref 38–126)
Anion gap: 12 (ref 5–15)
BUN: 23 mg/dL (ref 8–23)
CO2: 25 mmol/L (ref 22–32)
Calcium: 9.6 mg/dL (ref 8.9–10.3)
Chloride: 103 mmol/L (ref 98–111)
Creatinine: 1.01 mg/dL — ABNORMAL HIGH (ref 0.44–1.00)
GFR, Estimated: >60 mL/min (ref >60)
Glucose, Bld: 101 mg/dL — ABNORMAL HIGH (ref 70–99)
Potassium: 3 mmol/L — ABNORMAL LOW (ref 3.5–5.1)
Sodium: 140 mmol/L (ref 135–145)
Total Bilirubin: 0.5 mg/dL (ref 0.3–1.2)
Total Protein: 8 g/dL (ref 6.5–8.1)

## 2020-01-15 MED ORDER — SODIUM CHLORIDE 0.9 % IV SOLN
10.0000 mg | Freq: Once | INTRAVENOUS | Status: AC
  Administered 2020-01-15: 10 mg via INTRAVENOUS
  Filled 2020-01-15: qty 10

## 2020-01-15 MED ORDER — PALONOSETRON HCL INJECTION 0.25 MG/5ML
0.2500 mg | Freq: Once | INTRAVENOUS | Status: AC
  Administered 2020-01-15: 0.25 mg via INTRAVENOUS

## 2020-01-15 MED ORDER — SODIUM CHLORIDE 0.9% FLUSH
10.0000 mL | INTRAVENOUS | Status: DC | PRN
  Administered 2020-01-15: 10 mL
  Filled 2020-01-15: qty 10

## 2020-01-15 MED ORDER — SODIUM CHLORIDE 0.9% FLUSH
10.0000 mL | INTRAVENOUS | Status: DC | PRN
  Filled 2020-01-15: qty 10

## 2020-01-15 MED ORDER — PALONOSETRON HCL INJECTION 0.25 MG/5ML
INTRAVENOUS | Status: AC
  Filled 2020-01-15: qty 5

## 2020-01-15 MED ORDER — SODIUM CHLORIDE 0.9 % IV SOLN
150.0000 mg | Freq: Once | INTRAVENOUS | Status: AC
  Administered 2020-01-15: 150 mg via INTRAVENOUS
  Filled 2020-01-15: qty 150

## 2020-01-15 MED ORDER — FLUOROURACIL CHEMO INJECTION 2.5 GM/50ML
600.0000 mg/m2 | Freq: Once | INTRAVENOUS | Status: AC
  Administered 2020-01-15: 1250 mg via INTRAVENOUS
  Filled 2020-01-15: qty 25

## 2020-01-15 MED ORDER — HEPARIN SOD (PORK) LOCK FLUSH 100 UNIT/ML IV SOLN
500.0000 [IU] | Freq: Once | INTRAVENOUS | Status: AC | PRN
  Administered 2020-01-15: 500 [IU]
  Filled 2020-01-15: qty 5

## 2020-01-15 MED ORDER — SODIUM CHLORIDE 0.9 % IV SOLN
Freq: Once | INTRAVENOUS | Status: AC
  Filled 2020-01-15: qty 250

## 2020-01-15 MED ORDER — SODIUM CHLORIDE 0.9 % IV SOLN
600.0000 mg/m2 | Freq: Once | INTRAVENOUS | Status: AC
  Administered 2020-01-15: 1260 mg via INTRAVENOUS
  Filled 2020-01-15: qty 63

## 2020-01-15 MED ORDER — METHOTREXATE SODIUM (PF) CHEMO INJECTION 250 MG/10ML
40.0000 mg/m2 | Freq: Once | INTRAMUSCULAR | Status: AC
  Administered 2020-01-15: 84 mg via INTRAVENOUS
  Filled 2020-01-15: qty 3.36

## 2020-01-15 NOTE — Patient Instructions (Signed)
[facility] Discharge Instructions for Patients Receiving Chemotherapy  Today you received the following chemotherapy agents cytoxan, methotrexate, fluorourcil.  To help prevent nausea and vomiting after your treatment, we encourage you to take your nausea medication as directed.    If you develop nausea and vomiting that is not controlled by your nausea medication, call the clinic.   BELOW ARE SYMPTOMS THAT SHOULD BE REPORTED IMMEDIATELY:  *FEVER GREATER THAN 100.5 F  *CHILLS WITH OR WITHOUT FEVER  NAUSEA AND VOMITING THAT IS NOT CONTROLLED WITH YOUR NAUSEA MEDICATION  *UNUSUAL SHORTNESS OF BREATH  *UNUSUAL BRUISING OR BLEEDING  TENDERNESS IN MOUTH AND THROAT WITH OR WITHOUT PRESENCE OF ULCERS  *URINARY PROBLEMS  *BOWEL PROBLEMS  UNUSUAL RASH Items with * indicate a potential emergency and should be followed up as soon as possible.  Feel free to call the clinic should you have any questions or concerns. The clinic phone number is [phone].  Please show the [place] at check-in to the Emergency Department and triage nurse.

## 2020-01-15 NOTE — Progress Notes (Signed)
Pt stable at discharge.  

## 2020-01-15 NOTE — Progress Notes (Signed)
Met with patient at registration to obtain income/signature for [employer].  Patient approved for one-time $1000 [employer] grant to assist with personal expenses while going through treatment. Discussed expenses and how they are covered. Gave her a copy of the approval letter and expense sheet along with the Outpatient Pharmacy information.  She has my card for any additional financial questions or concerns. Escorted to blue waiting room for flush.

## 2020-01-17 ENCOUNTER — Other Ambulatory Visit: Payer: Self-pay

## 2020-01-17 ENCOUNTER — Inpatient Hospital Stay: Payer: Medicare PPO

## 2020-01-17 ENCOUNTER — Telehealth: Payer: Self-pay

## 2020-01-17 VITALS — BP 116/67 | HR 83 | Temp 98.2°F | Resp 18

## 2020-01-17 DIAGNOSIS — Z5111 Encounter for antineoplastic chemotherapy: Secondary | ICD-10-CM | POA: Diagnosis not present

## 2020-01-17 DIAGNOSIS — Z170 Estrogen receptor positive status [ER+]: Secondary | ICD-10-CM

## 2020-01-17 DIAGNOSIS — C50411 Malignant neoplasm of upper-outer quadrant of right female breast: Secondary | ICD-10-CM

## 2020-01-17 MED ORDER — PEGFILGRASTIM-CBQV 6 MG/0.6ML ~~LOC~~ SOSY
6.0000 mg | PREFILLED_SYRINGE | Freq: Once | SUBCUTANEOUS | Status: AC
  Administered 2020-01-17: 6 mg via SUBCUTANEOUS

## 2020-01-17 MED ORDER — PEGFILGRASTIM-CBQV 6 MG/0.6ML ~~LOC~~ SOSY
PREFILLED_SYRINGE | SUBCUTANEOUS | Status: AC
  Filled 2020-01-17: qty 0.6

## 2020-01-17 NOTE — Patient Instructions (Signed)

## 2020-01-17 NOTE — Telephone Encounter (Signed)
This LPN attempted to call pt to review K+ results and educate on K+ rich diet. Pt did not answer. LVM for pt to return call.

## 2020-01-20 ENCOUNTER — Telehealth: Payer: Self-pay | Admitting: Hematology and Oncology

## 2020-01-20 NOTE — Telephone Encounter (Signed)
Scheduled per 12/1 los. Pt will receive an updated appt calendar per next visit appt notes

## 2020-02-03 ENCOUNTER — Encounter: Payer: Self-pay | Admitting: *Deleted

## 2020-02-04 NOTE — Progress Notes (Signed)
Patient Care Team: Everardo Beals, NP as PCP - General Mauro Kaufmann, RN as Oncology Nurse Navigator Rockwell Germany, RN as Oncology Nurse Navigator  DIAGNOSIS:    ICD-10-CM   1. Malignant neoplasm of upper-outer quadrant of right breast in female, estrogen receptor positive ([place])  C50.411    Z17.0     SUMMARY OF ONCOLOGIC HISTORY: Oncology History  Malignant neoplasm of upper-outer quadrant of right breast in female, estrogen receptor positive ([place])  09/17/2019 Initial Diagnosis   Screening mammogram detected a right breast mass, not palpable on exam. Diagnostic mammogram showed 1.0cm mass at the 12:30 position in the right breast, with a mildly abnormal right axillary lymph node, 4.34m. Biopsy showed IDC in the breast, grade 3, HER-2 negative (1+), ER+ 70%, PR- 0%, Ki67 85%, and the lymph node negative for carcinoma.   09/24/2019 Cancer Staging   Staging form: Breast, AJCC 8th Edition - Clinical stage from 09/24/2019: Stage IB (cT1b, cN0(f), cM0, G3, ER+, PR-, HER2-) - Signed by GNicholas Lose MD on 10/08/2019   10/01/2019 Surgery   Right lumpectomy ([facility]): IDC, grade 3, 2.8cm, clear margins, 7 right axillary lymph nodes negative for carcinoma.   10/23/2019 Oncotype testing   Oncotype DX recurrence score 68: Greater than 39% risk of distant recurrence at 9 years   11/13/2019 -  Chemotherapy   The patient had palonosetron (ALOXI) injection 0.25 mg, 0.25 mg, Intravenous,  Once, 4 of 6 cycles Administration: 0.25 mg (11/13/2019), 0.25 mg (12/04/2019), 0.25 mg (12/25/2019), 0.25 mg (01/15/2020) methotrexate (PF) chemo injection 84 mg, 40 mg/m2 = 84 mg, Intravenous,  Once, 4 of 6 cycles Administration: 84 mg (11/13/2019), 84 mg (12/04/2019), 84 mg (12/25/2019), 84 mg (01/15/2020) pegfilgrastim-cbqv (UDENYCA) injection 6 mg, 6 mg, Subcutaneous, Once, 3 of 5 cycles Administration: 6 mg (12/06/2019), 6 mg (12/27/2019), 6 mg (01/17/2020) cyclophosphamide (CYTOXAN) 1,260 mg in sodium  chloride 0.9 % 250 mL chemo infusion, 600 mg/m2 = 1,260 mg, Intravenous,  Once, 4 of 6 cycles Dose modification: 500 mg/m2 (original dose 600 mg/m2, Cycle 5, Reason: Provider Judgment) Administration: 1,260 mg (11/13/2019), 1,260 mg (12/04/2019), 1,260 mg (12/25/2019), 1,260 mg (01/15/2020) fosaprepitant (EMEND) 150 mg in sodium chloride 0.9 % 145 mL IVPB, 150 mg, Intravenous,  Once, 1 of 1 cycle Administration: 150 mg (01/15/2020) fluorouracil (ADRUCIL) chemo injection 1,250 mg, 600 mg/m2 = 1,250 mg, Intravenous,  Once, 4 of 6 cycles Dose modification: 500 mg/m2 (original dose 600 mg/m2, Cycle 5, Reason: Dose not tolerated) Administration: 1,250 mg (11/13/2019), 1,250 mg (12/04/2019), 1,250 mg (12/25/2019), 1,250 mg (01/15/2020)  for chemotherapy treatment.      CHIEF COMPLIANT: Cycle5 CMF  INTERVAL HISTORY: Martha EMAMIis a 69y.o. with above-mentioned history of right breast cancer who underwent a right lumpectomyand is currently onadjuvant chemotherapywith CMF.She presents to the clinic todayfora toxicity checkandcycle5.  After the last cycle of chemo she developed nausea vomiting diarrhea that lasted for a week.  She attributes this to Emend that we gave her during treatment.  I am not certain that that is the cause of her symptoms.  She has recovered very well from all of that.  She tells me her problem is that her daughter is cooking healthy food and she is unable to eat all of that healthy food.  She stopped taking marijuana and that is the reason probably why she is not eating as much as she used to.  ALLERGIES:  is allergic to ceftin [cefuroxime] and zofran [ondansetron].  MEDICATIONS:  Current Outpatient  Medications  Medication Sig Dispense Refill   ALPRAZolam (XANAX) 0.5 MG tablet Take 0.25-0.5 mg by mouth 2 (two) times daily as needed. For anxiety     amLODipine-benazepril (LOTREL) 10-20 MG per capsule Take 1 capsule by mouth daily.     cholecalciferol (VITAMIN D) 400  UNITS TABS Take 400 Units by mouth daily.     eszopiclone 3 MG TABS Take 1 tablet (3 mg total) by mouth at bedtime as needed for sleep. Take immediately before bedtime     hydrochlorothiazide (HYDRODIURIL) 25 MG tablet Take 25 mg by mouth daily.     ibuprofen (ADVIL) 800 MG tablet Take 1 tablet (800 mg total) by mouth every 8 (eight) hours as needed. 30 tablet 0   lidocaine-prilocaine (EMLA) cream Apply to affected area once 30 g 3   omeprazole (PRILOSEC) 20 MG capsule Take 1 capsule (20 mg total) by mouth daily.     rosuvastatin (CRESTOR) 10 MG tablet      sertraline (ZOLOFT) 100 MG tablet Take 200 mg by mouth daily.     No current facility-administered medications for this visit.    PHYSICAL EXAMINATION: ECOG PERFORMANCE STATUS: 1 - Symptomatic but completely ambulatory  Vitals:   02/05/20 0857  BP: (!) 143/65  Pulse: 90  Resp: 18  Temp: 97.8 F (36.6 C)  SpO2: 99%   Filed Weights   02/05/20 0857  Weight: 215 lb 12.8 oz (97.9 kg)    LABORATORY DATA:  I have reviewed the data as listed CMP Latest Ref Rng & Units 01/15/2020 12/25/2019 12/04/2019  Glucose 70 - 99 mg/dL 101(H) 105(H) 101(H)  BUN 8 - 23 mg/dL '23 14 15  ' Creatinine 0.44 - 1.00 mg/dL 1.01(H) 0.83 1.01(H)  Sodium 135 - 145 mmol/L 140 144 139  Potassium 3.5 - 5.1 mmol/L 3.0(L) 4.0 3.4(L)  Chloride 98 - 111 mmol/L 103 111 105  CO2 22 - 32 mmol/L '25 23 25  ' Calcium 8.9 - 10.3 mg/dL 9.6 8.6(L) 9.2  Total Protein 6.5 - 8.1 g/dL 8.0 7.1 7.1  Total Bilirubin 0.3 - 1.2 mg/dL 0.5 0.4 <0.2(L)  Alkaline Phos 38 - 126 U/L 122 103 95  AST 15 - 41 U/L '29 24 27  ' ALT 0 - 44 U/L '17 14 16    ' Lab Results  Component Value Date   WBC 8.5 02/05/2020   HGB 10.7 (L) 02/05/2020   HCT 34.3 (L) 02/05/2020   MCV 87.7 02/05/2020   PLT 166 02/05/2020   NEUTROABS 6.1 02/05/2020    ASSESSMENT & PLAN:  Malignant neoplasm of upper-outer quadrant of right breast in female, estrogen receptor positive ([place]) 10/01/2019:Right  lumpectomy ([facility]): IDC, grade 3, 2.8cm, clear margins, 7 right axillary lymph nodes negative for carcinoma.ER 90%, PR 0%, Ki-67 85%, HER-2 negative T2N0 stage Ib Oncotype DX recurrence score 68, greater than 39% risk of distant recurrence at 9 years  Treatment plan: 1.Adjuvant chemotherapy withCMF(chosen because of PS issues) 2. Adjuvant radiation therapy followed by 3. Adjuvant antiestrogen therapy  CT of the chest abdomen and pelvis: 09/30/2019: Right breast lesion, internal mammary lymph nodes, upper abdominal nodal enlargement (probably related to liver cirrhosis) small lung nodules ------------------------------------------------------------------------------------------------------------------------------------------ Current treatment:Cycle5CMF Chemo toxicities: 1.Nausea and vomiting with chemo: Patient does not want to receive Emend.  I discontinued it. 2. Anemia: Monitoring closely. 3.Leukopenia:Improved after giving Neulasta injection. 4.Neulasta related adverse effects: Patient's pain nausea vomiting diarrhea and bone pain after receiving Neulasta shot.  Return to clinic in3weeks for cycle6 Our plan is to give her  6 cycles of CMF.    No orders of the defined types were placed in this encounter.  The patient has a good understanding of the overall plan. she agrees with it. she will call with any problems that may develop before the next visit here.  Total time spent: 20 mins including face to face time and time spent for planning, charting and coordination of care  Nicholas Lose, MD 02/05/2020  I, Cloyde Reams Dorshimer, am acting as scribe for Dr. Nicholas Lose.  I have reviewed the above documentation for accuracy and completeness, and I agree with the above.

## 2020-02-05 ENCOUNTER — Inpatient Hospital Stay: Payer: Medicare PPO | Admitting: Hematology and Oncology

## 2020-02-05 ENCOUNTER — Other Ambulatory Visit: Payer: Self-pay

## 2020-02-05 ENCOUNTER — Inpatient Hospital Stay: Payer: Medicare PPO

## 2020-02-05 ENCOUNTER — Other Ambulatory Visit: Payer: Self-pay | Admitting: *Deleted

## 2020-02-05 DIAGNOSIS — C50411 Malignant neoplasm of upper-outer quadrant of right female breast: Secondary | ICD-10-CM

## 2020-02-05 DIAGNOSIS — Z95828 Presence of other vascular implants and grafts: Secondary | ICD-10-CM

## 2020-02-05 DIAGNOSIS — Z170 Estrogen receptor positive status [ER+]: Secondary | ICD-10-CM

## 2020-02-05 DIAGNOSIS — Z5111 Encounter for antineoplastic chemotherapy: Secondary | ICD-10-CM | POA: Diagnosis not present

## 2020-02-05 LAB — CBC WITH DIFFERENTIAL (CANCER CENTER ONLY)
Abs Immature Granulocytes: 0.02 10*3/uL (ref 0.00–0.07)
Basophils Absolute: 0.1 10*3/uL (ref 0.0–0.1)
Basophils Relative: 1 %
Eosinophils Absolute: 0.2 10*3/uL (ref 0.0–0.5)
Eosinophils Relative: 2 %
HCT: 34.3 % — ABNORMAL LOW (ref 36.0–46.0)
Hemoglobin: 10.7 g/dL — ABNORMAL LOW (ref 12.0–15.0)
Immature Granulocytes: 0 %
Lymphocytes Relative: 16 %
Lymphs Abs: 1.3 10*3/uL (ref 0.7–4.0)
MCH: 27.4 pg (ref 26.0–34.0)
MCHC: 31.2 g/dL (ref 30.0–36.0)
MCV: 87.7 fL (ref 80.0–100.0)
Monocytes Absolute: 0.8 10*3/uL (ref 0.1–1.0)
Monocytes Relative: 9 %
Neutro Abs: 6.1 10*3/uL (ref 1.7–7.7)
Neutrophils Relative %: 72 %
Platelet Count: 166 10*3/uL (ref 150–400)
RBC: 3.91 MIL/uL (ref 3.87–5.11)
RDW: 22.1 % — ABNORMAL HIGH (ref 11.5–15.5)
WBC Count: 8.5 10*3/uL (ref 4.0–10.5)
nRBC: 0 % (ref 0.0–0.2)

## 2020-02-05 LAB — CMP (CANCER CENTER ONLY)
ALT: 20 U/L (ref 0–44)
AST: 30 U/L (ref 15–41)
Albumin: 3.7 g/dL (ref 3.5–5.0)
Alkaline Phosphatase: 116 U/L (ref 38–126)
Anion gap: 10 (ref 5–15)
BUN: 17 mg/dL (ref 8–23)
CO2: 24 mmol/L (ref 22–32)
Calcium: 9.2 mg/dL (ref 8.9–10.3)
Chloride: 108 mmol/L (ref 98–111)
Creatinine: 0.88 mg/dL (ref 0.44–1.00)
GFR, Estimated: >60 mL/min (ref >60)
Glucose, Bld: 100 mg/dL — ABNORMAL HIGH (ref 70–99)
Potassium: 4 mmol/L (ref 3.5–5.1)
Sodium: 142 mmol/L (ref 135–145)
Total Bilirubin: 0.4 mg/dL (ref 0.3–1.2)
Total Protein: 7.5 g/dL (ref 6.5–8.1)

## 2020-02-05 MED ORDER — SODIUM CHLORIDE 0.9 % IV SOLN
Freq: Once | INTRAVENOUS | Status: AC
  Filled 2020-02-05: qty 250

## 2020-02-05 MED ORDER — FLUOROURACIL CHEMO INJECTION 2.5 GM/50ML
500.0000 mg/m2 | Freq: Once | INTRAVENOUS | Status: AC
  Administered 2020-02-05: 1050 mg via INTRAVENOUS
  Filled 2020-02-05: qty 21

## 2020-02-05 MED ORDER — PALONOSETRON HCL INJECTION 0.25 MG/5ML
INTRAVENOUS | Status: AC
  Filled 2020-02-05: qty 5

## 2020-02-05 MED ORDER — SODIUM CHLORIDE 0.9 % IV SOLN
500.0000 mg/m2 | Freq: Once | INTRAVENOUS | Status: AC
  Administered 2020-02-05: 1060 mg via INTRAVENOUS
  Filled 2020-02-05: qty 53

## 2020-02-05 MED ORDER — SODIUM CHLORIDE 0.9 % IV SOLN
10.0000 mg | Freq: Once | INTRAVENOUS | Status: AC
  Administered 2020-02-05: 10 mg via INTRAVENOUS
  Filled 2020-02-05: qty 10

## 2020-02-05 MED ORDER — METHOTREXATE SODIUM (PF) CHEMO INJECTION 250 MG/10ML
40.0000 mg/m2 | Freq: Once | INTRAMUSCULAR | Status: AC
  Administered 2020-02-05: 84 mg via INTRAVENOUS
  Filled 2020-02-05: qty 3.36

## 2020-02-05 MED ORDER — PALONOSETRON HCL INJECTION 0.25 MG/5ML
0.2500 mg | Freq: Once | INTRAVENOUS | Status: AC
  Administered 2020-02-05: 0.25 mg via INTRAVENOUS

## 2020-02-05 MED ORDER — HEPARIN SOD (PORK) LOCK FLUSH 100 UNIT/ML IV SOLN
500.0000 [IU] | Freq: Once | INTRAVENOUS | Status: AC | PRN
  Administered 2020-02-05: 500 [IU]
  Filled 2020-02-05: qty 5

## 2020-02-05 MED ORDER — SODIUM CHLORIDE 0.9% FLUSH
10.0000 mL | INTRAVENOUS | Status: DC | PRN
  Administered 2020-02-05: 10 mL
  Filled 2020-02-05: qty 10

## 2020-02-05 NOTE — Progress Notes (Signed)
Pt. stable for discharge. Pt. left via ambulation with walker. No respiratory distress noted.

## 2020-02-05 NOTE — Assessment & Plan Note (Signed)
10/01/2019:Right lumpectomy ([facility]): IDC, grade 3, 2.8cm, clear margins, 7 right axillary lymph nodes negative for carcinoma.ER 90%, PR 0%, Ki-67 85%, HER-2 negative T2N0 stage Ib Oncotype DX recurrence score 68, greater than 39% risk of distant recurrence at 9 years  Treatment plan: 1.Adjuvant chemotherapy withCMF(chosen because of PS issues) 2. Adjuvant radiation therapy followed by 3. Adjuvant antiestrogen therapy  CT of the chest abdomen and pelvis: 09/30/2019: Right breast lesion, internal mammary lymph nodes, upper abdominal nodal enlargement (probably related to liver cirrhosis) small lung nodules ------------------------------------------------------------------------------------------------------------------------------------------ Current treatment:Cycle5CMF Chemo toxicities: 1.Nausea and vomiting with chemo: I added Namenda to her treatment plan.Marland Kitchen 2.anemia: Monitoring closely. 3.Leukopenia:Improved after giving Neulasta injection. 4.Neulasta related adverse effects: Patient's pain nausea vomiting diarrhea and bone pain after receiving Neulasta shot.  Return to clinic in3weeks for cycle6 Our plan is to give her 6 cycles of CMF.

## 2020-02-05 NOTE — Patient Instructions (Signed)

## 2020-02-05 NOTE — Patient Instructions (Signed)
[facility] Discharge Instructions for Patients Receiving Chemotherapy  Today you received the following chemotherapy agents: Cytoxan, Methotrexate, and Fluorouracil (5FU).  To help prevent nausea and vomiting after your treatment, we encourage you to take your nausea medication as directed by your MD.   If you develop nausea and vomiting that is not controlled by your nausea medication, call the clinic.   BELOW ARE SYMPTOMS THAT SHOULD BE REPORTED IMMEDIATELY:  *FEVER GREATER THAN 100.5 F  *CHILLS WITH OR WITHOUT FEVER  NAUSEA AND VOMITING THAT IS NOT CONTROLLED WITH YOUR NAUSEA MEDICATION  *UNUSUAL SHORTNESS OF BREATH  *UNUSUAL BRUISING OR BLEEDING  TENDERNESS IN MOUTH AND THROAT WITH OR WITHOUT PRESENCE OF ULCERS  *URINARY PROBLEMS  *BOWEL PROBLEMS  UNUSUAL RASH Items with * indicate a potential emergency and should be followed up as soon as possible.  Feel free to call the clinic should you have any questions or concerns. The clinic phone number is [phone].  Please show the CHEMO ALERT CARD at check-in to the Emergency Department and triage nurse.   

## 2020-02-08 ENCOUNTER — Ambulatory Visit: Payer: Medicare PPO

## 2020-02-10 ENCOUNTER — Telehealth: Payer: Self-pay | Admitting: Hematology and Oncology

## 2020-02-10 ENCOUNTER — Other Ambulatory Visit: Payer: Self-pay

## 2020-02-10 ENCOUNTER — Inpatient Hospital Stay: Payer: Medicare PPO

## 2020-02-10 VITALS — BP 149/97 | HR 78 | Resp 18

## 2020-02-10 DIAGNOSIS — Z170 Estrogen receptor positive status [ER+]: Secondary | ICD-10-CM

## 2020-02-10 DIAGNOSIS — Z5111 Encounter for antineoplastic chemotherapy: Secondary | ICD-10-CM | POA: Diagnosis not present

## 2020-02-10 DIAGNOSIS — C50411 Malignant neoplasm of upper-outer quadrant of right female breast: Secondary | ICD-10-CM

## 2020-02-10 MED ORDER — PEGFILGRASTIM-CBQV 6 MG/0.6ML ~~LOC~~ SOSY
PREFILLED_SYRINGE | SUBCUTANEOUS | Status: AC
  Filled 2020-02-10: qty 0.6

## 2020-02-10 MED ORDER — PEGFILGRASTIM-CBQV 6 MG/0.6ML ~~LOC~~ SOSY
6.0000 mg | PREFILLED_SYRINGE | Freq: Once | SUBCUTANEOUS | Status: AC
  Administered 2020-02-10: 6 mg via SUBCUTANEOUS

## 2020-02-10 NOTE — Patient Instructions (Signed)

## 2020-02-10 NOTE — Telephone Encounter (Signed)
No 12/22 los, no changes made to pt schedule

## 2020-02-25 NOTE — Progress Notes (Signed)
Patient Care Team: Everardo Beals, NP as PCP - General Mauro Kaufmann, RN as Oncology Nurse Navigator Rockwell Germany, RN as Oncology Nurse Navigator  DIAGNOSIS:    ICD-10-CM   1. Malignant neoplasm of upper-outer quadrant of right breast in female, estrogen receptor positive ([place])  C50.411    Z17.0     SUMMARY OF ONCOLOGIC HISTORY: Oncology History  Malignant neoplasm of upper-outer quadrant of right breast in female, estrogen receptor positive ([place])  09/17/2019 Initial Diagnosis   Screening mammogram detected a right breast mass, not palpable on exam. Diagnostic mammogram showed 1.0cm mass at the 12:30 position in the right breast, with a mildly abnormal right axillary lymph node, 4.59mm. Biopsy showed IDC in the breast, grade 3, HER-2 negative (1+), ER+ 70%, PR- 0%, Ki67 85%, and the lymph node negative for carcinoma.   09/24/2019 Cancer Staging   Staging form: Breast, AJCC 8th Edition - Clinical stage from 09/24/2019: Stage IB (cT1b, cN0(f), cM0, G3, ER+, PR-, HER2-) - Signed by Nicholas Lose, MD on 10/08/2019   10/01/2019 Surgery   Right lumpectomy ([facility]): IDC, grade 3, 2.8cm, clear margins, 7 right axillary lymph nodes negative for carcinoma.   10/23/2019 Oncotype testing   Oncotype DX recurrence score 68: Greater than 39% risk of distant recurrence at 9 years   11/13/2019 -  Chemotherapy   The patient had palonosetron (ALOXI) injection 0.25 mg, 0.25 mg, Intravenous,  Once, 5 of 6 cycles Administration: 0.25 mg (11/13/2019), 0.25 mg (12/04/2019), 0.25 mg (12/25/2019), 0.25 mg (01/15/2020), 0.25 mg (02/05/2020) methotrexate (PF) chemo injection 84 mg, 40 mg/m2 = 84 mg, Intravenous,  Once, 5 of 6 cycles Administration: 84 mg (11/13/2019), 84 mg (12/04/2019), 84 mg (12/25/2019), 84 mg (01/15/2020), 84 mg (02/05/2020) pegfilgrastim-cbqv (UDENYCA) injection 6 mg, 6 mg, Subcutaneous, Once, 4 of 5 cycles Administration: 6 mg (12/06/2019), 6 mg (12/27/2019), 6 mg (01/17/2020), 6 mg  (02/10/2020) cyclophosphamide (CYTOXAN) 1,260 mg in sodium chloride 0.9 % 250 mL chemo infusion, 600 mg/m2 = 1,260 mg, Intravenous,  Once, 5 of 6 cycles Dose modification: 500 mg/m2 (original dose 600 mg/m2, Cycle 5, Reason: Provider Judgment) Administration: 1,260 mg (11/13/2019), 1,260 mg (12/04/2019), 1,260 mg (12/25/2019), 1,260 mg (01/15/2020), 1,060 mg (02/05/2020) fosaprepitant (EMEND) 150 mg in sodium chloride 0.9 % 145 mL IVPB, 150 mg, Intravenous,  Once, 1 of 1 cycle Administration: 150 mg (01/15/2020) fluorouracil (ADRUCIL) chemo injection 1,250 mg, 600 mg/m2 = 1,250 mg, Intravenous,  Once, 5 of 6 cycles Dose modification: 500 mg/m2 (original dose 600 mg/m2, Cycle 5, Reason: Dose not tolerated) Administration: 1,250 mg (11/13/2019), 1,250 mg (12/04/2019), 1,250 mg (12/25/2019), 1,250 mg (01/15/2020), 1,050 mg (02/05/2020)  for chemotherapy treatment.      CHIEF COMPLIANT: Cycle6 CMF  INTERVAL HISTORY: [name] is a 70 y.o. with above-mentioned history of right breast cancer who underwent a right lumpectomyand is currently onadjuvant chemotherapywith CMF.She presents to the clinic todayfora toxicity checkandcycle6.   ALLERGIES:  is allergic to ceftin [cefuroxime] and zofran [ondansetron].  MEDICATIONS:  Current Outpatient Medications  Medication Sig Dispense Refill   ALPRAZolam (XANAX) 0.5 MG tablet Take 0.25-0.5 mg by mouth 2 (two) times daily as needed. For anxiety     amLODipine-benazepril (LOTREL) 10-20 MG per capsule Take 1 capsule by mouth daily.     cholecalciferol (VITAMIN D) 400 UNITS TABS Take 400 Units by mouth daily.     eszopiclone 3 MG TABS Take 1 tablet (3 mg total) by mouth at bedtime as needed for sleep. Take immediately before  bedtime     hydrochlorothiazide (HYDRODIURIL) 25 MG tablet Take 25 mg by mouth daily.     ibuprofen (ADVIL) 800 MG tablet Take 1 tablet (800 mg total) by mouth every 8 (eight) hours as needed. 30 tablet 0    lidocaine-prilocaine (EMLA) cream Apply to affected area once 30 g 3   omeprazole (PRILOSEC) 20 MG capsule Take 1 capsule (20 mg total) by mouth daily.     rosuvastatin (CRESTOR) 10 MG tablet      sertraline (ZOLOFT) 100 MG tablet Take 200 mg by mouth daily.     No current facility-administered medications for this visit.    PHYSICAL EXAMINATION: ECOG PERFORMANCE STATUS: 1 - Symptomatic but completely ambulatory  Vitals:   02/26/20 1041  BP: (!) 141/80  Pulse: 90  Resp: 18  Temp: (!) 97.4 F (36.3 C)  SpO2: 100%   Filed Weights   02/26/20 1041  Weight: 212 lb 9.6 oz (96.4 kg)    LABORATORY DATA:  I have reviewed the data as listed CMP Latest Ref Rng & Units 02/26/2020 02/05/2020 01/15/2020  Glucose 70 - 99 mg/dL 102(H) 100(H) 101(H)  BUN 8 - 23 mg/dL $Remove'17 17 23  'TWebuKG$ Creatinine 0.44 - 1.00 mg/dL 0.89 0.88 1.01(H)  Sodium 135 - 145 mmol/L 139 142 140  Potassium 3.5 - 5.1 mmol/L 3.7 4.0 3.0(L)  Chloride 98 - 111 mmol/L 104 108 103  CO2 22 - 32 mmol/L $RemoveB'26 24 25  'AjInghCp$ Calcium 8.9 - 10.3 mg/dL 9.2 9.2 9.6  Total Protein 6.5 - 8.1 g/dL 7.5 7.5 8.0  Total Bilirubin 0.3 - 1.2 mg/dL 0.6 0.4 0.5  Alkaline Phos 38 - 126 U/L 118 116 122  AST 15 - 41 U/L $Remo'24 30 29  'qKDcQ$ ALT 0 - 44 U/L $Remo'14 20 17    'MlSUv$ Lab Results  Component Value Date   WBC 9.7 02/26/2020   HGB 10.7 (L) 02/26/2020   HCT 33.3 (L) 02/26/2020   MCV 89.5 02/26/2020   PLT 175 02/26/2020   NEUTROABS 7.9 (H) 02/26/2020    ASSESSMENT & PLAN:  Malignant neoplasm of upper-outer quadrant of right breast in female, estrogen receptor positive ([place]) 10/01/2019:Right lumpectomy ([facility]): IDC, grade 3, 2.8cm, clear margins, 7 right axillary lymph nodes negative for carcinoma.ER 90%, PR 0%, Ki-67 85%, HER-2 negative T2N0 stage Ib Oncotype DX recurrence score 68, greater than 39% risk of distant recurrence at 9 years  Treatment plan: 1.Adjuvant chemotherapy withCMF(chosen because of PS issues) 2. Adjuvant radiation therapy followed  by 3. Adjuvant antiestrogen therapy  CT of the chest abdomen and pelvis: 09/30/2019: Right breast lesion, internal mammary lymph nodes, upper abdominal nodal enlargement (probably related to liver cirrhosis) small lung nodules ------------------------------------------------------------------------------------------------------------------------------------------ Current treatment:Cycle6CMF Chemo toxicities: 1.Nausea and vomiting: Resolved 2. Anemia: Monitoring closely.  Hemoglobin improved to 10.7 3.Leukopenia:Improved after giving Neulasta injection.    We will refer the patient for adjuvant radiation therapy. Port can also be removed. Return to clinic after radiation is complete to start antiestrogen therapy.    No orders of the defined types were placed in this encounter.  The patient has a good understanding of the overall plan. she agrees with it. she will call with any problems that may develop before the next visit here.  Total time spent: 30 mins including face to face time and time spent for planning, charting and coordination of care  Nicholas Lose, MD 02/26/2020  I, Cloyde Reams Dorshimer, am acting as scribe for Dr. Nicholas Lose.  I have reviewed the above documentation for  accuracy and completeness, and I agree with the above.

## 2020-02-26 ENCOUNTER — Inpatient Hospital Stay: Payer: Medicare PPO | Admitting: Hematology and Oncology

## 2020-02-26 ENCOUNTER — Inpatient Hospital Stay: Payer: Medicare PPO | Attending: Hematology and Oncology

## 2020-02-26 ENCOUNTER — Inpatient Hospital Stay: Payer: Medicare PPO

## 2020-02-26 ENCOUNTER — Other Ambulatory Visit: Payer: Self-pay

## 2020-02-26 DIAGNOSIS — Z95828 Presence of other vascular implants and grafts: Secondary | ICD-10-CM

## 2020-02-26 DIAGNOSIS — D701 Agranulocytosis secondary to cancer chemotherapy: Secondary | ICD-10-CM | POA: Diagnosis not present

## 2020-02-26 DIAGNOSIS — Z170 Estrogen receptor positive status [ER+]: Secondary | ICD-10-CM

## 2020-02-26 DIAGNOSIS — C50411 Malignant neoplasm of upper-outer quadrant of right female breast: Secondary | ICD-10-CM

## 2020-02-26 DIAGNOSIS — D6481 Anemia due to antineoplastic chemotherapy: Secondary | ICD-10-CM | POA: Diagnosis not present

## 2020-02-26 DIAGNOSIS — Z5111 Encounter for antineoplastic chemotherapy: Secondary | ICD-10-CM | POA: Diagnosis not present

## 2020-02-26 LAB — CMP (CANCER CENTER ONLY)
ALT: 14 U/L (ref 0–44)
AST: 24 U/L (ref 15–41)
Albumin: 3.6 g/dL (ref 3.5–5.0)
Alkaline Phosphatase: 118 U/L (ref 38–126)
Anion gap: 9 (ref 5–15)
BUN: 17 mg/dL (ref 8–23)
CO2: 26 mmol/L (ref 22–32)
Calcium: 9.2 mg/dL (ref 8.9–10.3)
Chloride: 104 mmol/L (ref 98–111)
Creatinine: 0.89 mg/dL (ref 0.44–1.00)
GFR, Estimated: >60 mL/min (ref >60)
Glucose, Bld: 102 mg/dL — ABNORMAL HIGH (ref 70–99)
Potassium: 3.7 mmol/L (ref 3.5–5.1)
Sodium: 139 mmol/L (ref 135–145)
Total Bilirubin: 0.6 mg/dL (ref 0.3–1.2)
Total Protein: 7.5 g/dL (ref 6.5–8.1)

## 2020-02-26 LAB — CBC WITH DIFFERENTIAL (CANCER CENTER ONLY)
Abs Immature Granulocytes: 0.04 10*3/uL (ref 0.00–0.07)
Basophils Absolute: 0.1 10*3/uL (ref 0.0–0.1)
Basophils Relative: 1 %
Eosinophils Absolute: 0.1 10*3/uL (ref 0.0–0.5)
Eosinophils Relative: 1 %
HCT: 33.3 % — ABNORMAL LOW (ref 36.0–46.0)
Hemoglobin: 10.7 g/dL — ABNORMAL LOW (ref 12.0–15.0)
Immature Granulocytes: 0 %
Lymphocytes Relative: 11 %
Lymphs Abs: 1.1 10*3/uL (ref 0.7–4.0)
MCH: 28.8 pg (ref 26.0–34.0)
MCHC: 32.1 g/dL (ref 30.0–36.0)
MCV: 89.5 fL (ref 80.0–100.0)
Monocytes Absolute: 0.5 10*3/uL (ref 0.1–1.0)
Monocytes Relative: 5 %
Neutro Abs: 7.9 10*3/uL — ABNORMAL HIGH (ref 1.7–7.7)
Neutrophils Relative %: 82 %
Platelet Count: 175 10*3/uL (ref 150–400)
RBC: 3.72 MIL/uL — ABNORMAL LOW (ref 3.87–5.11)
RDW: 21 % — ABNORMAL HIGH (ref 11.5–15.5)
WBC Count: 9.7 10*3/uL (ref 4.0–10.5)
nRBC: 0 % (ref 0.0–0.2)

## 2020-02-26 MED ORDER — METHOTREXATE SODIUM (PF) CHEMO INJECTION 250 MG/10ML
40.0000 mg/m2 | Freq: Once | INTRAMUSCULAR | Status: AC
  Administered 2020-02-26: 84 mg via INTRAVENOUS
  Filled 2020-02-26: qty 3.36

## 2020-02-26 MED ORDER — HEPARIN SOD (PORK) LOCK FLUSH 100 UNIT/ML IV SOLN
500.0000 [IU] | Freq: Once | INTRAVENOUS | Status: AC | PRN
  Administered 2020-02-26: 500 [IU]
  Filled 2020-02-26: qty 5

## 2020-02-26 MED ORDER — SODIUM CHLORIDE 0.9 % IV SOLN
10.0000 mg | Freq: Once | INTRAVENOUS | Status: AC
  Administered 2020-02-26: 10 mg via INTRAVENOUS
  Filled 2020-02-26: qty 10

## 2020-02-26 MED ORDER — FLUOROURACIL CHEMO INJECTION 2.5 GM/50ML
500.0000 mg/m2 | Freq: Once | INTRAVENOUS | Status: AC
  Administered 2020-02-26: 1050 mg via INTRAVENOUS
  Filled 2020-02-26: qty 21

## 2020-02-26 MED ORDER — SODIUM CHLORIDE 0.9 % IV SOLN
Freq: Once | INTRAVENOUS | Status: AC
  Filled 2020-02-26: qty 250

## 2020-02-26 MED ORDER — PALONOSETRON HCL INJECTION 0.25 MG/5ML
0.2500 mg | Freq: Once | INTRAVENOUS | Status: AC
  Administered 2020-02-26: 0.25 mg via INTRAVENOUS

## 2020-02-26 MED ORDER — SODIUM CHLORIDE 0.9% FLUSH
10.0000 mL | INTRAVENOUS | Status: DC | PRN
  Administered 2020-02-26: 10 mL
  Filled 2020-02-26: qty 10

## 2020-02-26 MED ORDER — PALONOSETRON HCL INJECTION 0.25 MG/5ML
INTRAVENOUS | Status: AC
  Filled 2020-02-26: qty 5

## 2020-02-26 MED ORDER — SODIUM CHLORIDE 0.9 % IV SOLN
500.0000 mg/m2 | Freq: Once | INTRAVENOUS | Status: AC
  Administered 2020-02-26: 1060 mg via INTRAVENOUS
  Filled 2020-02-26: qty 53

## 2020-02-26 NOTE — Patient Instructions (Signed)

## 2020-02-26 NOTE — Patient Instructions (Signed)
[facility] Discharge Instructions for Patients Receiving Chemotherapy  Today you received the following chemotherapy agents: Cytoxan, Methotrexate, and Fluorouracil (5FU).  To help prevent nausea and vomiting after your treatment, we encourage you to take your nausea medication as directed by your MD.   If you develop nausea and vomiting that is not controlled by your nausea medication, call the clinic.   BELOW ARE SYMPTOMS THAT SHOULD BE REPORTED IMMEDIATELY:  *FEVER GREATER THAN 100.5 F  *CHILLS WITH OR WITHOUT FEVER  NAUSEA AND VOMITING THAT IS NOT CONTROLLED WITH YOUR NAUSEA MEDICATION  *UNUSUAL SHORTNESS OF BREATH  *UNUSUAL BRUISING OR BLEEDING  TENDERNESS IN MOUTH AND THROAT WITH OR WITHOUT PRESENCE OF ULCERS  *URINARY PROBLEMS  *BOWEL PROBLEMS  UNUSUAL RASH Items with * indicate a potential emergency and should be followed up as soon as possible.  Feel free to call the clinic should you have any questions or concerns. The clinic phone number is [phone].  Please show the [place] at check-in to the Emergency Department and triage nurse.

## 2020-02-26 NOTE — Assessment & Plan Note (Signed)
10/01/2019:Right lumpectomy ([facility]): IDC, grade 3, 2.8cm, clear margins, 7 right axillary lymph nodes negative for carcinoma.ER 90%, PR 0%, Ki-67 85%, HER-2 negative T2N0 stage Ib Oncotype DX recurrence score 68, greater than 39% risk of distant recurrence at 9 years  Treatment plan: 1.Adjuvant chemotherapy withCMF(chosen because of PS issues) 2. Adjuvant radiation therapy followed by 3. Adjuvant antiestrogen therapy  CT of the chest abdomen and pelvis: 09/30/2019: Right breast lesion, internal mammary lymph nodes, upper abdominal nodal enlargement (probably related to liver cirrhosis) small lung nodules ------------------------------------------------------------------------------------------------------------------------------------------ Current treatment:Cycle6CMF Chemo toxicities: 1.Nausea and vomiting with chemo: Patient does not want to receive Emend.  I discontinued it. 2. Anemia: Monitoring closely. 3.Leukopenia:Improved after giving Neulasta injection. 4.Neulasta related adverse effects: Patient's pain nausea vomiting diarrhea and bone pain after receiving Neulasta shot.  We will refer the patient for adjuvant radiation therapy. Return to clinic after radiation is complete to start antiestrogen therapy.

## 2020-02-27 ENCOUNTER — Encounter: Payer: Self-pay | Admitting: *Deleted

## 2020-02-28 ENCOUNTER — Inpatient Hospital Stay: Payer: Medicare PPO

## 2020-02-28 ENCOUNTER — Other Ambulatory Visit: Payer: Self-pay

## 2020-02-28 VITALS — BP 94/60 | HR 76 | Temp 97.6°F | Resp 18

## 2020-02-28 DIAGNOSIS — Z170 Estrogen receptor positive status [ER+]: Secondary | ICD-10-CM

## 2020-02-28 DIAGNOSIS — C50411 Malignant neoplasm of upper-outer quadrant of right female breast: Secondary | ICD-10-CM

## 2020-02-28 DIAGNOSIS — Z5111 Encounter for antineoplastic chemotherapy: Secondary | ICD-10-CM | POA: Diagnosis not present

## 2020-02-28 MED ORDER — PEGFILGRASTIM-CBQV 6 MG/0.6ML ~~LOC~~ SOSY
6.0000 mg | PREFILLED_SYRINGE | Freq: Once | SUBCUTANEOUS | Status: AC
  Administered 2020-02-28: 6 mg via SUBCUTANEOUS

## 2020-02-28 MED ORDER — PEGFILGRASTIM-CBQV 6 MG/0.6ML ~~LOC~~ SOSY
PREFILLED_SYRINGE | SUBCUTANEOUS | Status: AC
  Filled 2020-02-28: qty 0.6

## 2020-02-28 NOTE — Patient Instructions (Signed)
Pegfilgrastim injection What is this medicine? PEGFILGRASTIM (PEG fil gra stim) is a long-acting granulocyte colony-stimulating factor that stimulates the growth of neutrophils, a type of white blood cell important in the body's fight against infection. It is used to reduce the incidence of fever and infection in patients with certain types of cancer who are receiving chemotherapy that affects the bone marrow, and to increase survival after being exposed to high doses of radiation. This medicine may be used for other purposes; ask your health care provider or pharmacist if you have questions. COMMON BRAND NAME(S): Fulphila, Neulasta, Nyvepria, UDENYCA, Ziextenzo What should I tell my health care provider before I take this medicine? They need to know if you have any of these conditions:  kidney disease  latex allergy  ongoing radiation therapy  sickle cell disease  skin reactions to acrylic adhesives (On-Body Injector only)  an unusual or allergic reaction to pegfilgrastim, filgrastim, other medicines, foods, dyes, or preservatives  pregnant or trying to get pregnant  breast-feeding How should I use this medicine? This medicine is for injection under the skin. If you get this medicine at home, you will be taught how to prepare and give the pre-filled syringe or how to use the On-body Injector. Refer to the patient Instructions for Use for detailed instructions. Use exactly as directed. Tell your healthcare provider immediately if you suspect that the On-body Injector may not have performed as intended or if you suspect the use of the On-body Injector resulted in a missed or partial dose. It is important that you put your used needles and syringes in a special sharps container. Do not put them in a trash can. If you do not have a sharps container, call your pharmacist or healthcare provider to get one. Talk to your pediatrician regarding the use of this medicine in children. While this drug  may be prescribed for selected conditions, precautions do apply. Overdosage: If you think you have taken too much of this medicine contact a poison control center or emergency room at once. NOTE: This medicine is only for you. Do not share this medicine with others. What if I miss a dose? It is important not to miss your dose. Call your doctor or health care professional if you miss your dose. If you miss a dose due to an On-body Injector failure or leakage, a new dose should be administered as soon as possible using a single prefilled syringe for manual use. What may interact with this medicine? Interactions have not been studied. This list may not describe all possible interactions. Give your health care provider a list of all the medicines, herbs, non-prescription drugs, or dietary supplements you use. Also tell them if you smoke, drink alcohol, or use illegal drugs. Some items may interact with your medicine. What should I watch for while using this medicine? Your condition will be monitored carefully while you are receiving this medicine. You may need blood work done while you are taking this medicine. Talk to your health care provider about your risk of cancer. You may be more at risk for certain types of cancer if you take this medicine. If you are going to need a MRI, CT scan, or other procedure, tell your doctor that you are using this medicine (On-Body Injector only). What side effects may I notice from receiving this medicine? Side effects that you should report to your doctor or health care professional as soon as possible:  allergic reactions (skin rash, itching or hives, swelling of   the face, lips, or tongue)  back pain  dizziness  fever  pain, redness, or irritation at site where injected  pinpoint red spots on the skin  red or dark-Lasky urine  shortness of breath or breathing problems  stomach or side pain, or pain at the shoulder  swelling  tiredness  trouble  passing urine or change in the amount of urine  unusual bruising or bleeding Side effects that usually do not require medical attention (report to your doctor or health care professional if they continue or are bothersome):  bone pain  muscle pain This list may not describe all possible side effects. Call your doctor for medical advice about side effects. You may report side effects to FDA at 1-800-FDA-1088. Where should I keep my medicine? Keep out of the reach of children. If you are using this medicine at home, you will be instructed on how to store it. Throw away any unused medicine after the expiration date on the label. NOTE: This sheet is a summary. It may not cover all possible information. If you have questions about this medicine, talk to your doctor, pharmacist, or health care provider.  2021 Elsevier/Gold Standard (2019-02-22 13:20:51)  

## 2020-02-29 ENCOUNTER — Telehealth: Payer: Self-pay | Admitting: Hematology and Oncology

## 2020-02-29 NOTE — Telephone Encounter (Signed)
No 11/2 los, no changes made to pt schedule  

## 2020-03-11 ENCOUNTER — Other Ambulatory Visit: Payer: Self-pay

## 2020-03-11 ENCOUNTER — Telehealth: Payer: Self-pay

## 2020-03-11 ENCOUNTER — Encounter: Payer: Self-pay | Admitting: Radiation Oncology

## 2020-03-11 NOTE — Telephone Encounter (Signed)
Spoke with patient in regards to telephone visit with Shona Simpson PA on 03/11/20 @ 2:30pm. Patient verbalized understanding of appointment date and time. Reviewed meaningful use questions. TM

## 2020-03-19 ENCOUNTER — Encounter: Payer: Self-pay | Admitting: Radiation Oncology

## 2020-03-19 ENCOUNTER — Ambulatory Visit
Admission: RE | Admit: 2020-03-19 | Discharge: 2020-03-19 | Disposition: A | Discharge status: Home / Self Care | Payer: Medicare PPO | Source: Ambulatory Visit | Attending: Radiation Oncology | Admitting: Radiation Oncology

## 2020-03-19 DIAGNOSIS — Z170 Estrogen receptor positive status [ER+]: Secondary | ICD-10-CM

## 2020-03-19 DIAGNOSIS — C50411 Malignant neoplasm of upper-outer quadrant of right female breast: Secondary | ICD-10-CM

## 2020-03-20 NOTE — Progress Notes (Signed)
Radiation Oncology         [phone] ________________________________  Follow Up Outpatient Visit - Conducted via telephone due to current COVID-19 concerns for limiting patient exposure  I spoke with the patient to conduct this consult visit via telephone to spare the patient unnecessary potential exposure in the healthcare setting during the current COVID-19 pandemic. The patient was notified in advance and was offered a [place] meeting to allow for face to face communication but unfortunately reported that they did not have the appropriate resources/technology to support such a visit and instead preferred to proceed with a telephone visit  Name: [name]        MRN: [id number]  Date of Service: 03/19/2020 DOB: 04/22/1950  XB:[id number], Joelene Millin, NP  Everardo Beals, NP     REFERRING PHYSICIAN: Everardo Beals, NP   DIAGNOSIS: The encounter diagnosis was Malignant neoplasm of upper-outer quadrant of right breast in female, estrogen receptor positive ([place]).   HISTORY OF PRESENT ILLNESS: [name] is a 70 y.o. female with a diagnosis of right breast cancer. The patient was noted to have screening detected mass in the right breast. This was not palpable on examination, but diagnostic imaging revealed a 1 cm mass at the 12:30 position with a mildly abnormal appearing right axillary node measuring 4.4 mm. The location of the mass was at 1230 o'clock. On 09/02/2019 she underwent a biopsy of this area as well as the lymph node. 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 She underwent lumpectomy with sentinel lymph node biopsy on 10/01/19. Final pathology revealed a grade 3 invasive ductal carcinoma measuring 2.8 cm, her resection margins were negative and 7 sentinel nodes were submitted none of which contained metastatic disease.  Unfortunately  her Oncotype score was elevated at 68 so she received systemic chemotherapy between 11/13/2019 and 02/26/2020.  She is contacted today to discuss neck steps of adjuvant radiotherapy.     PREVIOUS RADIATION THERAPY: No   PAST MEDICAL HISTORY:  Past Medical History:  Diagnosis Date  . Anxiety   . Arthritis   . Depression   . GERD (gastroesophageal reflux disease)   . Hernia    umbilical  . Hyperlipidemia   . Hypertension   . Sleep apnea        PAST SURGICAL HISTORY: Past Surgical History:  Procedure Laterality Date  . ABDOMINAL HYSTERECTOMY     Partial  . BACK SURGERY     4 Bilateral Titanium Screws near L4 and L5  . BREAST LUMPECTOMY WITH RADIOACTIVE SEED AND SENTINEL LYMPH NODE BIOPSY Right 10/01/2019   Procedure: RIGHT BREAST LUMPECTOMY WITH RADIOACTIVE SEED AND SENTINEL LYMPH NODE BIOPSY;  Surgeon: Erroll Luna, MD;  Location: [facility];  Service: General;  Laterality: Right;  PEC BLOCK  . CHOLECYSTECTOMY    . EXCISION / CURETTAGE BONE CYST PHALANGES OF FOOT Right   . HEEL SPUR EXCISION Left   . PORTACATH PLACEMENT Right 11/12/2019   Procedure: INSERTION PORT-A-CATH WITH ULTRASOUND GUIDANCE, ACCESSED;  Surgeon: Erroll Luna, MD;  Location: [facility];  Service: General;  Laterality: Right;  . SPINAL CORD STIMULATOR INSERTION  09/26/2012   Dr Rolena Infante  . SPINAL CORD STIMULATOR INSERTION N/A 09/26/2012   Procedure: SPINAL CORD STIMULATOR PLACEMENT;  Surgeon: Melina Schools, MD;  Location: [facility];  Service: Orthopedics;  Laterality: N/A;  . TONSILLECTOMY  FAMILY HISTORY:  Family History  Problem Relation Age of Onset  . Diabetes Mother   . Cancer Father        prostate  . Emphysema Maternal Grandfather      SOCIAL HISTORY:  reports that she quit smoking about 8 years ago. Her smoking use included cigarettes and e-cigarettes. She has a 7.50 pack-year smoking history. She has never used smokeless tobacco. She reports current alcohol  use. She reports current drug use. Drug: Marijuana. The patient is divorced and lives in [place].  ALLERGIES: Ceftin [cefuroxime] and Zofran [ondansetron]   MEDICATIONS:  Current Outpatient Medications  Medication Sig Dispense Refill  . ALPRAZolam (XANAX) 0.5 MG tablet Take 0.25-0.5 mg by mouth 2 (two) times daily as needed. For anxiety    . amLODipine-benazepril (LOTREL) 10-20 MG per capsule Take 1 capsule by mouth daily.    . cholecalciferol (VITAMIN D) 400 UNITS TABS Take 400 Units by mouth daily.    . eszopiclone 3 MG TABS Take 1 tablet (3 mg total) by mouth at bedtime as needed for sleep. Take immediately before bedtime    . hydrochlorothiazide (HYDRODIURIL) 25 MG tablet Take 25 mg by mouth daily.    Marland Kitchen omeprazole (PRILOSEC) 20 MG capsule Take 1 capsule (20 mg total) by mouth daily.    . rosuvastatin (CRESTOR) 10 MG tablet     . sertraline (ZOLOFT) 100 MG tablet Take 200 mg by mouth daily.    Marland Kitchen ibuprofen (ADVIL) 800 MG tablet Take 1 tablet (800 mg total) by mouth every 8 (eight) hours as needed. (Patient not taking: Reported on 03/19/2020) 30 tablet 0  . lidocaine-prilocaine (EMLA) cream Apply to affected area once (Patient not taking: Reported on 03/19/2020) 30 g 3   No current facility-administered medications for this encounter.     REVIEW OF SYSTEMS: On review of systems, the patient reports she has been doing well up well since having a few weeks break from chemo but did have significant fatigue, neuropathy, and at times some nausea.  She is glad to have had some additional weeks to recover.  She is hopeful to have her Port-A-Cath removed as this has also been quite uncomfortable for her in the right chest.  PHYSICAL EXAM:  Wt Readings from Last 3 Encounters:  02/26/20 212 lb 9.6 oz (96.4 kg)  02/05/20 215 lb 12.8 oz (97.9 kg)  01/15/20 211 lb 9.6 oz (96 kg)   Unable to assess given encounter type   ECOG = 0  0 - Asymptomatic (Fully active, able to carry on all predisease  activities without restriction)  1 - Symptomatic but completely ambulatory (Restricted in physically strenuous activity but ambulatory and able to carry out work of a light or sedentary nature. For example, light housework, office work)  2 - Symptomatic, <50% in bed during the day (Ambulatory and capable of all self care but unable to carry out any work activities. Up and about more than 50% of waking hours)  3 - Symptomatic, >50% in bed, but not bedbound (Capable of only limited self-care, confined to bed or chair 50% or more of waking hours)  4 - Bedbound (Completely disabled. Cannot carry on any self-care. Totally confined to bed or chair)  5 - Death   Eustace Pen MM, Creech RH, Tormey DC, et al. [phone]). "Toxicity and response criteria of the [facility]". [place] Oncol. 5 (6): 649-55    LABORATORY DATA:  Lab Results  Component Value Date   WBC 9.7  02/26/2020   HGB 10.7 (L) 02/26/2020   HCT 33.3 (L) 02/26/2020   MCV 89.5 02/26/2020   PLT 175 02/26/2020   Lab Results  Component Value Date   NA 139 02/26/2020   K 3.7 02/26/2020   CL 104 02/26/2020   CO2 26 02/26/2020   Lab Results  Component Value Date   ALT 14 02/26/2020   AST 24 02/26/2020   ALKPHOS 118 02/26/2020   BILITOT 0.6 02/26/2020      RADIOGRAPHY: No results found.     IMPRESSION/PLAN: 1. 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 Dr. Lisbeth Renshaw discusses the final pathology findings and reviews the nature of right breast disease. T she has done well since completing surgery and now chemotherapy.  Dr. Lisbeth Renshaw discusses the rationale for external radiotherapy to reduce risks of local recurrence to the breast followed by antiestrogen therapy. We discussed the risks, benefits, short, and long term effects of radiotherapy, and the patient is interested in proceeding. Dr. Lisbeth Renshaw discusses the delivery and logistics of radiotherapy and recommends 4  weeks of  radiotherapy.  She will come in next Tuesday for simulation at which time she will signed written consent to proceed. 2. In situ spinal cord stimulator and Port-A-Cath. The leads are still present but the battery pack has been removed. Dr. Lisbeth Renshaw does not anticipate any issues with proceeding as this is not a complete or functional device.  We encouraged the patient to delay removal of her Port-A-Cath until completion of radiotherapy to avoid long-term issues with wound healing.  She is in agreement with this plan.   Given current concerns for patient exposure during the COVID-19 pandemic, this encounter was conducted via telephone.  The patient has provided two factor identification and has given verbal consent for this type of encounter and has been advised to only accept a meeting of this type in a secure network environment. The time spent during this encounter was 45 minutes including preparation, discussion, and coordination of the patient's care. The attendants for this meeting include  Dr. Lisbeth Renshaw, Hayden Pedro  and Gerlene Fee.  During the encounter,  Dr. Lisbeth Renshaw, and Hayden Pedro were located at [facility] Radiation Oncology Department.  MACHEL VIOLANTE was located at home. .    The above documentation reflects my direct findings during this shared patient visit. Please see the separate note by Dr. Lisbeth Renshaw on this date for the remainder of the patient's plan of care.    Carola Rhine, PAC

## 2020-03-24 ENCOUNTER — Ambulatory Visit
Admission: RE | Admit: 2020-03-24 | Discharge: 2020-03-24 | Disposition: A | Discharge status: Home / Self Care | Payer: Medicare PPO | Source: Ambulatory Visit | Attending: Radiation Oncology | Admitting: Radiation Oncology

## 2020-03-24 ENCOUNTER — Other Ambulatory Visit: Payer: Self-pay

## 2020-03-24 DIAGNOSIS — C50411 Malignant neoplasm of upper-outer quadrant of right female breast: Secondary | ICD-10-CM | POA: Diagnosis present

## 2020-03-24 DIAGNOSIS — Z510 Encounter for antineoplastic radiation therapy: Secondary | ICD-10-CM | POA: Insufficient documentation

## 2020-03-30 ENCOUNTER — Other Ambulatory Visit: Payer: Self-pay | Admitting: *Deleted

## 2020-03-30 ENCOUNTER — Encounter: Payer: Self-pay | Admitting: *Deleted

## 2020-03-31 ENCOUNTER — Telehealth: Payer: Self-pay | Admitting: Hematology and Oncology

## 2020-03-31 DIAGNOSIS — Z510 Encounter for antineoplastic radiation therapy: Secondary | ICD-10-CM | POA: Diagnosis not present

## 2020-03-31 NOTE — Telephone Encounter (Signed)
Scheduled appt per 2/14 sch msg - mailed letter with appt date and time

## 2020-04-01 ENCOUNTER — Ambulatory Visit
Admission: RE | Admit: 2020-04-01 | Discharge: 2020-04-01 | Disposition: A | Discharge status: Home / Self Care | Payer: Medicare PPO | Source: Ambulatory Visit | Attending: Radiation Oncology | Admitting: Radiation Oncology

## 2020-04-01 DIAGNOSIS — Z510 Encounter for antineoplastic radiation therapy: Secondary | ICD-10-CM | POA: Diagnosis not present

## 2020-04-01 DIAGNOSIS — Z170 Estrogen receptor positive status [ER+]: Secondary | ICD-10-CM

## 2020-04-01 DIAGNOSIS — C50411 Malignant neoplasm of upper-outer quadrant of right female breast: Secondary | ICD-10-CM

## 2020-04-01 MED ORDER — ALRA NON-METALLIC DEODORANT (RAD-ONC)
1.0000 "application " | Freq: Once | TOPICAL | Status: AC
  Administered 2020-04-01: 1 via TOPICAL

## 2020-04-01 MED ORDER — RADIAPLEXRX EX GEL: Freq: Once | CUTANEOUS | Status: AC

## 2020-04-01 NOTE — Progress Notes (Signed)
Pt here for patient teaching.  Pt given Radiation and You booklet, skin care instructions, Alra deodorant and Radiaplex gel.  Reviewed areas of pertinence such as fatigue, hair loss, skin changes, breast tenderness and breast swelling . Pt able to give teach back of to pat skin and use unscented/gentle soap,apply Radiaplex bid, avoid applying anything to skin within 4 hours of treatment, avoid wearing an under wire bra and to use an electric razor if they must shave. Pt verbalizes understanding of information given and will contact nursing with any questions or concerns.     Senora Lacson M. Sameen Leas RN, BSN      

## 2020-04-02 ENCOUNTER — Ambulatory Visit
Admission: RE | Admit: 2020-04-02 | Discharge: 2020-04-02 | Disposition: A | Discharge status: Home / Self Care | Payer: Medicare PPO | Source: Ambulatory Visit | Attending: Radiation Oncology | Admitting: Radiation Oncology

## 2020-04-02 DIAGNOSIS — Z510 Encounter for antineoplastic radiation therapy: Secondary | ICD-10-CM | POA: Diagnosis not present

## 2020-04-03 ENCOUNTER — Ambulatory Visit
Admission: RE | Admit: 2020-04-03 | Discharge: 2020-04-03 | Disposition: A | Discharge status: Home / Self Care | Payer: Medicare PPO | Source: Ambulatory Visit | Attending: Radiation Oncology | Admitting: Radiation Oncology

## 2020-04-03 ENCOUNTER — Other Ambulatory Visit: Payer: Self-pay

## 2020-04-03 DIAGNOSIS — Z510 Encounter for antineoplastic radiation therapy: Secondary | ICD-10-CM | POA: Diagnosis not present

## 2020-04-06 ENCOUNTER — Ambulatory Visit
Admission: RE | Admit: 2020-04-06 | Discharge: 2020-04-06 | Disposition: A | Discharge status: Home / Self Care | Payer: Medicare PPO | Source: Ambulatory Visit | Attending: Radiation Oncology | Admitting: Radiation Oncology

## 2020-04-06 DIAGNOSIS — Z510 Encounter for antineoplastic radiation therapy: Secondary | ICD-10-CM | POA: Diagnosis not present

## 2020-04-07 ENCOUNTER — Ambulatory Visit
Admission: RE | Admit: 2020-04-07 | Discharge: 2020-04-07 | Disposition: A | Discharge status: Home / Self Care | Payer: Medicare PPO | Source: Ambulatory Visit | Attending: Radiation Oncology | Admitting: Radiation Oncology

## 2020-04-07 ENCOUNTER — Other Ambulatory Visit: Payer: Self-pay

## 2020-04-07 DIAGNOSIS — Z510 Encounter for antineoplastic radiation therapy: Secondary | ICD-10-CM | POA: Diagnosis not present

## 2020-04-08 ENCOUNTER — Ambulatory Visit
Admission: RE | Admit: 2020-04-08 | Discharge: 2020-04-08 | Disposition: A | Discharge status: Home / Self Care | Payer: Medicare PPO | Source: Ambulatory Visit | Attending: Radiation Oncology | Admitting: Radiation Oncology

## 2020-04-08 DIAGNOSIS — Z510 Encounter for antineoplastic radiation therapy: Secondary | ICD-10-CM | POA: Diagnosis not present

## 2020-04-09 ENCOUNTER — Ambulatory Visit
Admission: RE | Admit: 2020-04-09 | Discharge: 2020-04-09 | Disposition: A | Discharge status: Home / Self Care | Payer: Medicare PPO | Source: Ambulatory Visit | Attending: Radiation Oncology | Admitting: Radiation Oncology

## 2020-04-09 DIAGNOSIS — Z510 Encounter for antineoplastic radiation therapy: Secondary | ICD-10-CM | POA: Diagnosis not present

## 2020-04-10 ENCOUNTER — Ambulatory Visit
Admission: RE | Admit: 2020-04-10 | Discharge: 2020-04-10 | Disposition: A | Discharge status: Home / Self Care | Payer: Medicare PPO | Source: Ambulatory Visit | Attending: Radiation Oncology | Admitting: Radiation Oncology

## 2020-04-10 DIAGNOSIS — Z510 Encounter for antineoplastic radiation therapy: Secondary | ICD-10-CM | POA: Diagnosis not present

## 2020-04-13 ENCOUNTER — Ambulatory Visit
Admission: RE | Admit: 2020-04-13 | Discharge: 2020-04-13 | Disposition: A | Discharge status: Home / Self Care | Payer: Medicare PPO | Source: Ambulatory Visit | Attending: Radiation Oncology | Admitting: Radiation Oncology

## 2020-04-13 ENCOUNTER — Other Ambulatory Visit: Payer: Self-pay

## 2020-04-13 DIAGNOSIS — Z510 Encounter for antineoplastic radiation therapy: Secondary | ICD-10-CM | POA: Diagnosis not present

## 2020-04-14 ENCOUNTER — Ambulatory Visit
Admission: RE | Admit: 2020-04-14 | Discharge: 2020-04-14 | Disposition: A | Discharge status: Home / Self Care | Payer: Medicare PPO | Source: Ambulatory Visit | Attending: Radiation Oncology | Admitting: Radiation Oncology

## 2020-04-14 ENCOUNTER — Encounter: Payer: Self-pay | Admitting: Radiation Oncology

## 2020-04-14 ENCOUNTER — Other Ambulatory Visit: Payer: Self-pay

## 2020-04-14 DIAGNOSIS — Z510 Encounter for antineoplastic radiation therapy: Secondary | ICD-10-CM | POA: Insufficient documentation

## 2020-04-14 DIAGNOSIS — C50411 Malignant neoplasm of upper-outer quadrant of right female breast: Secondary | ICD-10-CM | POA: Insufficient documentation

## 2020-04-15 ENCOUNTER — Ambulatory Visit
Admission: RE | Admit: 2020-04-15 | Discharge: 2020-04-15 | Disposition: A | Discharge status: Home / Self Care | Payer: Medicare PPO | Source: Ambulatory Visit | Attending: Radiation Oncology | Admitting: Radiation Oncology

## 2020-04-15 DIAGNOSIS — Z510 Encounter for antineoplastic radiation therapy: Secondary | ICD-10-CM | POA: Diagnosis not present

## 2020-04-16 ENCOUNTER — Ambulatory Visit
Admission: RE | Admit: 2020-04-16 | Discharge: 2020-04-16 | Disposition: A | Discharge status: Home / Self Care | Payer: Medicare PPO | Source: Ambulatory Visit | Attending: Radiation Oncology | Admitting: Radiation Oncology

## 2020-04-16 DIAGNOSIS — Z510 Encounter for antineoplastic radiation therapy: Secondary | ICD-10-CM | POA: Diagnosis not present

## 2020-04-17 ENCOUNTER — Ambulatory Visit
Admission: RE | Admit: 2020-04-17 | Discharge: 2020-04-17 | Disposition: A | Discharge status: Home / Self Care | Payer: Medicare PPO | Source: Ambulatory Visit | Attending: Radiation Oncology | Admitting: Radiation Oncology

## 2020-04-17 ENCOUNTER — Other Ambulatory Visit: Payer: Self-pay

## 2020-04-17 DIAGNOSIS — Z510 Encounter for antineoplastic radiation therapy: Secondary | ICD-10-CM | POA: Diagnosis not present

## 2020-04-20 ENCOUNTER — Ambulatory Visit
Admission: RE | Admit: 2020-04-20 | Discharge: 2020-04-20 | Disposition: A | Discharge status: Home / Self Care | Payer: Medicare PPO | Source: Ambulatory Visit | Attending: Radiation Oncology | Admitting: Radiation Oncology

## 2020-04-20 DIAGNOSIS — Z510 Encounter for antineoplastic radiation therapy: Secondary | ICD-10-CM | POA: Diagnosis not present

## 2020-04-21 ENCOUNTER — Ambulatory Visit
Admission: RE | Admit: 2020-04-21 | Discharge: 2020-04-21 | Disposition: A | Discharge status: Home / Self Care | Payer: Medicare PPO | Source: Ambulatory Visit | Attending: Radiation Oncology | Admitting: Radiation Oncology

## 2020-04-21 DIAGNOSIS — Z510 Encounter for antineoplastic radiation therapy: Secondary | ICD-10-CM | POA: Diagnosis not present

## 2020-04-22 ENCOUNTER — Ambulatory Visit
Admission: RE | Admit: 2020-04-22 | Discharge: 2020-04-22 | Disposition: A | Discharge status: Home / Self Care | Payer: Medicare PPO | Source: Ambulatory Visit | Attending: Radiation Oncology | Admitting: Radiation Oncology

## 2020-04-22 DIAGNOSIS — Z510 Encounter for antineoplastic radiation therapy: Secondary | ICD-10-CM | POA: Diagnosis not present

## 2020-04-23 ENCOUNTER — Ambulatory Visit
Admission: RE | Admit: 2020-04-23 | Discharge: 2020-04-23 | Disposition: A | Discharge status: Home / Self Care | Payer: Medicare PPO | Source: Ambulatory Visit | Attending: Radiation Oncology | Admitting: Radiation Oncology

## 2020-04-23 DIAGNOSIS — Z510 Encounter for antineoplastic radiation therapy: Secondary | ICD-10-CM | POA: Diagnosis not present

## 2020-04-24 ENCOUNTER — Ambulatory Visit
Admission: RE | Admit: 2020-04-24 | Discharge: 2020-04-24 | Disposition: A | Discharge status: Home / Self Care | Payer: Medicare PPO | Source: Ambulatory Visit | Attending: Radiation Oncology | Admitting: Radiation Oncology

## 2020-04-24 DIAGNOSIS — Z510 Encounter for antineoplastic radiation therapy: Secondary | ICD-10-CM | POA: Diagnosis not present

## 2020-04-26 NOTE — Progress Notes (Incomplete)
Patient Care Team: Everardo Beals, NP as PCP - General Mauro Kaufmann, RN as Oncology Nurse Navigator Rockwell Germany, RN as Oncology Nurse Navigator  DIAGNOSIS: No diagnosis found.  SUMMARY OF ONCOLOGIC HISTORY: Oncology History  Malignant neoplasm of upper-outer quadrant of right breast in female, estrogen receptor positive ([place])  09/17/2019 Initial Diagnosis   Screening mammogram detected a right breast mass, not palpable on exam. Diagnostic mammogram showed 1.0cm mass at the 12:30 position in the right breast, with a mildly abnormal right axillary lymph node, 4.74m. Biopsy showed IDC in the breast, grade 3, HER-2 negative (1+), ER+ 70%, PR- 0%, Ki67 85%, and the lymph node negative for carcinoma.   09/24/2019 Cancer Staging   Staging form: Breast, AJCC 8th Edition - Clinical stage from 09/24/2019: Stage IB (cT1b, cN0(f), cM0, G3, ER+, PR-, HER2-) - Signed by GNicholas Lose MD on 10/08/2019   10/01/2019 Surgery   Right lumpectomy ([facility]): IDC, grade 3, 2.8cm, clear margins, 7 right axillary lymph nodes negative for carcinoma.   10/23/2019 Oncotype testing   Oncotype DX recurrence score 68: Greater than 39% risk of distant recurrence at 9 years   11/13/2019 -  Chemotherapy    Patient is on Treatment Plan: BREAST ADJUVANT CMF IV Q21D      04/02/2020 -  Radiation Therapy   Adjuvant radiation     CHIEF COMPLIANT: Follow-up to discuss antiestrogen therapy   INTERVAL HISTORY: Martha SCHRADEis a 70y.o. with above-mentioned history of right breast cancer who underwent a right lumpectomy, adjuvant chemotherapy, and is currently onradiation.She presents to the clinic todayto discuss antiestrogen therapy.  ALLERGIES:  is allergic to ceftin [cefuroxime] and zofran [ondansetron].  MEDICATIONS:  Current Outpatient Medications  Medication Sig Dispense Refill  . ALPRAZolam (XANAX) 0.5 MG tablet Take 0.25-0.5 mg by mouth 2 (two) times daily as needed. For anxiety    .  amLODipine-benazepril (LOTREL) 10-20 MG per capsule Take 1 capsule by mouth daily.    . cholecalciferol (VITAMIN D) 400 UNITS TABS Take 400 Units by mouth daily.    . eszopiclone 3 MG TABS Take 1 tablet (3 mg total) by mouth at bedtime as needed for sleep. Take immediately before bedtime    . hydrochlorothiazide (HYDRODIURIL) 25 MG tablet Take 25 mg by mouth daily.    .Marland Kitchenibuprofen (ADVIL) 800 MG tablet Take 1 tablet (800 mg total) by mouth every 8 (eight) hours as needed. (Patient not taking: Reported on 03/19/2020) 30 tablet 0  . lidocaine-prilocaine (EMLA) cream Apply to affected area once (Patient not taking: Reported on 03/19/2020) 30 g 3  . omeprazole (PRILOSEC) 20 MG capsule Take 1 capsule (20 mg total) by mouth daily.    . rosuvastatin (CRESTOR) 10 MG tablet     . sertraline (ZOLOFT) 100 MG tablet Take 200 mg by mouth daily.     No current facility-administered medications for this visit.    PHYSICAL EXAMINATION: ECOG PERFORMANCE STATUS: {CHL ONC ECOG PS:[phone]}  There were no vitals filed for this visit. There were no vitals filed for this visit.  LABORATORY DATA:  I have reviewed the data as listed CMP Latest Ref Rng & Units 02/26/2020 02/05/2020 01/15/2020  Glucose 70 - 99 mg/dL 102(H) 100(H) 101(H)  BUN 8 - 23 mg/dL _0 Creatinine 0.44 - 1.00 mg/dL 0.89 0.88 1.01(H)  Sodium 135 - 145 mmol/L 139 142 140  Potassium 3.5 - 5.1 mmol/L 3.7 4.0 3.0(L)  Chloride 98 - 111 mmol/L 104 108 103  CO2 22 - 32 mmol/L _0 Calcium 8.9 - 10.3 mg/dL 9.2 9.2 9.6  Total Protein 6.5 - 8.1 g/dL 7.5 7.5 8.0  Total Bilirubin 0.3 - 1.2 mg/dL 0.6 0.4 0.5  Alkaline Phos 38 - 126 U/L 118 116 122  AST 15 - 41 U/L _1 ALT 0 - 44 U/L _2 Lab Results  Component Value Date   WBC 9.7 02/26/2020   HGB 10.7 (L) 02/26/2020   HCT 33.3 (L) 02/26/2020   MCV 89.5 02/26/2020   PLT 175 02/26/2020   NEUTROABS 7.9 (H) 02/26/2020    ASSESSMENT & PLAN:  No problem-specific Assessment  & Plan notes found for this encounter.    No orders of the defined types were placed in this encounter.  The patient has a good understanding of the overall plan. she agrees with it. she will call with any problems that may develop before the next visit here.  Total time spent: *** mins including face to face time and time spent for planning, charting and coordination of care  Rulon Eisenmenger, MD, MPH 04/26/2020  I, Molly Dorshimer, am acting as scribe for Dr. Nicholas Lose.  {insert scribe attestation}

## 2020-04-27 ENCOUNTER — Other Ambulatory Visit: Payer: Self-pay

## 2020-04-27 ENCOUNTER — Encounter: Payer: Self-pay | Admitting: *Deleted

## 2020-04-27 ENCOUNTER — Inpatient Hospital Stay: Payer: Medicare PPO | Admitting: Hematology and Oncology

## 2020-04-27 ENCOUNTER — Inpatient Hospital Stay: Payer: Medicare PPO | Attending: Hematology and Oncology | Admitting: Hematology and Oncology

## 2020-04-27 ENCOUNTER — Ambulatory Visit
Admission: RE | Admit: 2020-04-27 | Discharge: 2020-04-27 | Disposition: A | Discharge status: Home / Self Care | Payer: Medicare PPO | Source: Ambulatory Visit | Attending: Radiation Oncology | Admitting: Radiation Oncology

## 2020-04-27 VITALS — BP 115/73 | HR 98 | Temp 97.2°F | Resp 18 | Ht 63.0 in | Wt 207.4 lb

## 2020-04-27 DIAGNOSIS — Z780 Asymptomatic menopausal state: Secondary | ICD-10-CM | POA: Diagnosis not present

## 2020-04-27 DIAGNOSIS — Z9221 Personal history of antineoplastic chemotherapy: Secondary | ICD-10-CM | POA: Insufficient documentation

## 2020-04-27 DIAGNOSIS — C50411 Malignant neoplasm of upper-outer quadrant of right female breast: Secondary | ICD-10-CM | POA: Diagnosis not present

## 2020-04-27 DIAGNOSIS — Z79899 Other long term (current) drug therapy: Secondary | ICD-10-CM | POA: Diagnosis not present

## 2020-04-27 DIAGNOSIS — Z510 Encounter for antineoplastic radiation therapy: Secondary | ICD-10-CM | POA: Diagnosis not present

## 2020-04-27 DIAGNOSIS — Z170 Estrogen receptor positive status [ER+]: Secondary | ICD-10-CM

## 2020-04-27 DIAGNOSIS — Z923 Personal history of irradiation: Secondary | ICD-10-CM | POA: Insufficient documentation

## 2020-04-27 MED ORDER — ANASTROZOLE 1 MG PO TABS: 1.0000 mg | ORAL_TABLET | Freq: Every day | ORAL | 3 refills | Status: DC

## 2020-04-27 NOTE — Assessment & Plan Note (Deleted)
10/01/2019:Right lumpectomy ([facility]): IDC, grade 3, 2.8cm, clear margins, 7 right axillary lymph nodes negative for carcinoma.ER 90%, PR 0%, Ki-67 85%, HER-2 negative T2N0 stage Ib Oncotype DX recurrence score 68, greater than 39% risk of distant recurrence at 9 years  Treatment plan: 1.Adjuvant chemotherapy withCMF x6 cycles completed 02/26/2020(chosen because of PS issues) 2. Adjuvant radiation therapy followed by 3. Adjuvant antiestrogen therapy  CT of the chest abdomen and pelvis: 09/30/2019: Right breast lesion, internal mammary lymph nodes, upper abdominal nodal enlargement (probably related to liver cirrhosis) small lung nodules ------------------------------------------------------------------------------------------------------------------------------------------ Current treatment:Adjuvant radiation started 04/02/2020 to be completed 04/28/2020 Anastrozole counseling: We discussed the risks and benefits of anti-estrogen therapy with aromatase inhibitors. These include but not limited to insomnia, hot flashes, mood changes, vaginal dryness, bone density loss, and weight gain. We strongly believe that the benefits far outweigh the risks. Patient understands these risks and consented to starting treatment. Planned treatment duration is 7 years.  Return to clinic in 3 months for survivorship care plan visit

## 2020-04-27 NOTE — Assessment & Plan Note (Signed)
10/01/2019:Right lumpectomy ([facility]): IDC, grade 3, 2.8cm, clear margins, 7 right axillary lymph nodes negative for carcinoma.ER 90%, PR 0%, Ki-67 85%, HER-2 negative T2N0 stage Ib Oncotype DX recurrence score 68, greater than 39% risk of distant recurrence at 9 years  Treatment plan: 1.Adjuvant chemotherapy withCMF x6 cycles completed 02/26/2020(chosen because of PS issues) 2. Adjuvant radiation therapy followed by 3. Adjuvant antiestrogen therapy  CT of the chest abdomen and pelvis: 09/30/2019: Right breast lesion, internal mammary lymph nodes, upper abdominal nodal enlargement (probably related to liver cirrhosis) small lung nodules ------------------------------------------------------------------------------------------------------------------------------------------ Current treatment:Adjuvant radiation started 04/02/2020 to be completed 04/28/2020 Anastrozole counseling: We discussed the risks and benefits of anti-estrogen therapy with aromatase inhibitors. These include but not limited to insomnia, hot flashes, mood changes, vaginal dryness, bone density loss, and weight gain. We strongly believe that the benefits far outweigh the risks. Patient understands these risks and consented to starting treatment. Planned treatment duration is 7 years.  Return to clinic in 3 months for survivorship care plan visit      

## 2020-04-27 NOTE — Progress Notes (Signed)
Patient Care Team: Everardo Beals, NP as PCP - General Mauro Kaufmann, RN as Oncology Nurse Navigator Rockwell Germany, RN as Oncology Nurse Navigator  DIAGNOSIS:  Encounter Diagnosis  Name Primary?  . Malignant neoplasm of upper-outer quadrant of right breast in female, estrogen receptor positive ([place])     SUMMARY OF ONCOLOGIC HISTORY: Oncology History  Malignant neoplasm of upper-outer quadrant of right breast in female, estrogen receptor positive ([place])  09/17/2019 Initial Diagnosis   Screening mammogram detected a right breast mass, not palpable on exam. Diagnostic mammogram showed 1.0cm mass at the 12:30 position in the right breast, with a mildly abnormal right axillary lymph node, 4.64m. Biopsy showed IDC in the breast, grade 3, HER-2 negative (1+), ER+ 70%, PR- 0%, Ki67 85%, and the lymph node negative for carcinoma.   09/24/2019 Cancer Staging   Staging form: Breast, AJCC 8th Edition - Clinical stage from 09/24/2019: Stage IB (cT1b, cN0(f), cM0, G3, ER+, PR-, HER2-) - Signed by GNicholas Lose MD on 10/08/2019   10/01/2019 Surgery   Right lumpectomy ([facility]): IDC, grade 3, 2.8cm, clear margins, 7 right axillary lymph nodes negative for carcinoma.   10/23/2019 Oncotype testing   Oncotype DX recurrence score 68: Greater than 39% risk of distant recurrence at 9 years   11/13/2019 -  Chemotherapy    Patient is on Treatment Plan: BREAST ADJUVANT CMF IV Q21D      04/02/2020 -  Radiation Therapy   Adjuvant radiation     CHIEF COMPLIANT:   INTERVAL HISTORY: Martha MONDESIRis a   ALLERGIES:  is allergic to ceftin [cefuroxime] and zofran [ondansetron].  MEDICATIONS:  Current Outpatient Medications  Medication Sig Dispense Refill  . ALPRAZolam (XANAX) 0.5 MG tablet Take 0.25-0.5 mg by mouth 2 (two) times daily as needed. For anxiety    . amLODipine-benazepril (LOTREL) 10-20 MG per capsule Take 1 capsule by mouth daily.    . cholecalciferol (VITAMIN D) 400 UNITS TABS Take  400 Units by mouth daily.    . eszopiclone 3 MG TABS Take 1 tablet (3 mg total) by mouth at bedtime as needed for sleep. Take immediately before bedtime    . hydrochlorothiazide (HYDRODIURIL) 25 MG tablet Take 25 mg by mouth daily.    .Marland Kitchenibuprofen (ADVIL) 800 MG tablet Take 1 tablet (800 mg total) by mouth every 8 (eight) hours as needed. (Patient not taking: Reported on 03/19/2020) 30 tablet 0  . lidocaine-prilocaine (EMLA) cream Apply to affected area once (Patient not taking: Reported on 03/19/2020) 30 g 3  . omeprazole (PRILOSEC) 20 MG capsule Take 1 capsule (20 mg total) by mouth daily.    . rosuvastatin (CRESTOR) 10 MG tablet     . sertraline (ZOLOFT) 100 MG tablet Take 200 mg by mouth daily.     No current facility-administered medications for this visit.    PHYSICAL EXAMINATION: ECOG PERFORMANCE STATUS: 2 - Symptomatic, <50% confined to bed  Vitals:   04/27/20 1540  BP: 115/73  Pulse: 98  Resp: 18  Temp: (!) 97.2 F (36.2 C)  SpO2: 100%   Filed Weights   04/27/20 1540  Weight: 207 lb 6.4 oz (94.1 kg)      LABORATORY DATA:  I have reviewed the data as listed CMP Latest Ref Rng & Units 02/26/2020 02/05/2020 01/15/2020  Glucose 70 - 99 mg/dL 102(H) 100(H) 101(H)  BUN 8 - 23 mg/dL '17 17 23  ' Creatinine 0.44 - 1.00 mg/dL 0.89 0.88 1.01(H)  Sodium 135 - 145 mmol/L  139 142 140  Potassium 3.5 - 5.1 mmol/L 3.7 4.0 3.0(L)  Chloride 98 - 111 mmol/L 104 108 103  CO2 22 - 32 mmol/L '26 24 25  ' Calcium 8.9 - 10.3 mg/dL 9.2 9.2 9.6  Total Protein 6.5 - 8.1 g/dL 7.5 7.5 8.0  Total Bilirubin 0.3 - 1.2 mg/dL 0.6 0.4 0.5  Alkaline Phos 38 - 126 U/L 118 116 122  AST 15 - 41 U/L '24 30 29  ' ALT 0 - 44 U/L '14 20 17    ' Lab Results  Component Value Date   WBC 9.7 02/26/2020   HGB 10.7 (L) 02/26/2020   HCT 33.3 (L) 02/26/2020   MCV 89.5 02/26/2020   PLT 175 02/26/2020   NEUTROABS 7.9 (H) 02/26/2020    ASSESSMENT & PLAN:  Malignant neoplasm of upper-outer quadrant of right breast in  female, estrogen receptor positive ([place]) 10/01/2019:Right lumpectomy ([facility]): IDC, grade 3, 2.8cm, clear margins, 7 right axillary lymph nodes negative for carcinoma.ER 90%, PR 0%, Ki-67 85%, HER-2 negative T2N0 stage Ib Oncotype DX recurrence score 68, greater than 39% risk of distant recurrence at 9 years  Treatment plan: 1.Adjuvant chemotherapy withCMF x6 cycles completed 02/26/2020(chosen because of PS issues) 2. Adjuvant radiation therapy followed by 3. Adjuvant antiestrogen therapy  CT of the chest abdomen and pelvis: 09/30/2019: Right breast lesion, internal mammary lymph nodes, upper abdominal nodal enlargement (probably related to liver cirrhosis) small lung nodules ------------------------------------------------------------------------------------------------------------------------------------------ Current treatment:Adjuvant radiation started 04/02/2020 to be completed 04/28/2020 Anastrozole counseling: We discussed the risks and benefits of anti-estrogen therapy with aromatase inhibitors. These include but not limited to insomnia, hot flashes, mood changes, vaginal dryness, bone density loss, and weight gain. We strongly believe that the benefits far outweigh the risks. Patient understands these risks and consented to starting treatment. Planned treatment duration is 7 years.  She uses a walker to get around. Bone density will be scheduled in the next 2 months.  Return to clinic in 3 months for survivorship care plan visit         No orders of the defined types were placed in this encounter.  The patient has a good understanding of the overall plan. she agrees with it. she will call with any problems that may develop before the next visit here. Total time spent: 30 mins including face to face time and time spent for planning, charting and co-ordination of care   Harriette Ohara, MD 04/27/20

## 2020-04-28 ENCOUNTER — Other Ambulatory Visit: Payer: Self-pay

## 2020-04-28 ENCOUNTER — Ambulatory Visit
Admission: RE | Admit: 2020-04-28 | Discharge: 2020-04-28 | Disposition: A | Discharge status: Home / Self Care | Payer: Medicare PPO | Source: Ambulatory Visit | Attending: Radiation Oncology | Admitting: Radiation Oncology

## 2020-04-28 ENCOUNTER — Encounter: Payer: Self-pay | Admitting: Radiation Oncology

## 2020-04-28 DIAGNOSIS — Z510 Encounter for antineoplastic radiation therapy: Secondary | ICD-10-CM | POA: Diagnosis not present

## 2020-04-28 NOTE — Progress Notes (Signed)
  Radiation Oncology         [phone] ________________________________  Name: [name] MRN: [id number]  Date: 04/14/2020  DOB: 25-Oct-1950  SIMULATION NOTE   NARRATIVE:  The patient underwent simulation today for ongoing radiation therapy.  The existing CT study set was employed for the purpose of virtual treatment planning.  The target and avoidance structures were reviewed and modified as necessary.  Treatment planning then occurred.  The radiation boost prescription was entered and confirmed.  A total of 3 complex treatment devices were fabricated in the form of multi-leaf collimators to shape radiation around the targets while maximally excluding nearby normal structures. I have requested : Isodose Plan.    PLAN:  This modified radiation beam arrangement is intended to continue the current radiation dose to an additional 8 Gy in 4 fractions for a total cumulative dose of 50.56 Gy.    ------------------------------------------------  Jodelle Gross, MD, PhD

## 2020-05-04 NOTE — Progress Notes (Signed)
  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 She had anticipated discoloration of her skin the treatment field.  Plan: The patient will receive a call in about one month from the radiation oncology department. She will continue follow up with Dr. Lindi Adie as well.   ________________________________________________    Carola Rhine, [facility]

## 2020-05-14 ENCOUNTER — Other Ambulatory Visit: Payer: Self-pay | Admitting: Hematology and Oncology

## 2020-05-14 DIAGNOSIS — M810 Age-related osteoporosis without current pathological fracture: Secondary | ICD-10-CM

## 2020-06-08 ENCOUNTER — Other Ambulatory Visit: Payer: Self-pay

## 2020-06-08 ENCOUNTER — Ambulatory Visit
Admission: RE | Admit: 2020-06-08 | Discharge: 2020-06-08 | Disposition: A | Discharge status: Home / Self Care | Payer: Medicare PPO | Source: Ambulatory Visit | Attending: Radiation Oncology | Admitting: Radiation Oncology

## 2020-06-08 ENCOUNTER — Encounter: Payer: Self-pay | Admitting: Hematology and Oncology

## 2020-06-08 DIAGNOSIS — Z170 Estrogen receptor positive status [ER+]: Secondary | ICD-10-CM

## 2020-06-08 DIAGNOSIS — C50411 Malignant neoplasm of upper-outer quadrant of right female breast: Secondary | ICD-10-CM | POA: Insufficient documentation

## 2020-06-08 NOTE — Progress Notes (Signed)
Called patient to discuss how remaining [employer] funds can be used.  She has my contact name and number for any additional financial questions or concerns.

## 2020-06-08 NOTE — Progress Notes (Signed)
  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  Interval Since Last Radiation:  6 weeks   04/01/2020 through 04/28/2020 Site Technique Total Dose (Gy) Dose per Fx (Gy) Completed Fx Beam Energies  Breast, Right: Breast_Rt 3D 42.56/42.56 2.66 16/16 10X, 15X  Breast, Right: Breast_Rt_Bst 3D 8/8 2 4/4 6X, 10X, 15X     Narrative:  The patient was contacted today for routine follow-up. During treatment she did very well with radiotherapy and did not have significant desquamation. She reports she is doing well. Her skin was irration right after treatment and had redness and itching under the breast. Radioplex is helpful with her redness. She's had some other issues such as bursitis and is having her PAC removed soon as well. She has questions about her grand monies as well.  Impression/Plan: 1. 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 The patient has been doing well since completion of radiotherapy. We discussed that we would be happy to continue to follow her as needed, but she will also continue to follow up with Dr. Lindi Adie in medical oncology. She was counseled on skin care as well as measures to avoid sun exposure to this area.  2. Survivorship. We discussed the importance of survivorship evaluation and encouraged her to attend her upcoming visit with that clinic. 3. [employer]. I'll reach out to social work and financial assistance to see how they can help the patient with her questions.     Carola Rhine, PAC

## 2020-07-06 ENCOUNTER — Ambulatory Visit: Payer: Self-pay | Admitting: Surgery

## 2020-07-06 NOTE — H&P (View-Only) (Signed)
[name] Appointment: 07/06/2020 3:20 PM Location: [facility] Patient #: [id number] DOB: 12/11/1950 Divorced / Language: English / Race: Black or African American Female  History of Present Illness (Miranda Garber A. Naksh Radi MD; 07/06/2020 3:47 PM) Patient words: The patient returns for follow-up of her right breast cancer. She is completing chemotherapy and needs her port removed. Overall, she is tolerating chemotherapy and radiation therapy well.  The patient is a 69 year old female.   Problem List/Past Medical (Michelle R. Brooks, CMA; 07/06/2020 3:33 PM) BREAST CANCER, STAGE 1, RIGHT (C50.911)  Past Surgical History (Michelle R. Brooks, CMA; 07/06/2020 3:33 PM) Breast Biopsy Right. Colon Polyp Removal - Colonoscopy Foot Surgery Bilateral. Gallbladder Surgery - Laparoscopic Hysterectomy (not due to cancer) - Partial Spinal Surgery - Lower Back  Diagnostic Studies History (Michelle R. Brooks, CMA; 07/06/2020 3:33 PM) Colonoscopy 5-10 years ago Mammogram within last year Pap Smear >5 years ago  Allergies (Michelle R. Brooks, CMA; 07/06/2020 3:33 PM) No Known Drug Allergies [09/09/2019]:  Medication History (Michelle R. Brooks, CMA; 07/06/2020 3:33 PM) Sertraline HCl (100MG Tablet, Oral) Active. Rosuvastatin Calcium (10MG Tablet, Oral) Active. hydroCHLOROthiazide (25MG Tablet, Oral) Active. amLODIPine Besy-Benazepril HCl (10-20MG Capsule, Oral) Active. Eszopiclone (3MG Tablet, Oral) Active. ALPRAZolam (0.5MG Tablet, Oral) Active. Medications Reconciled  Social History (Michelle R. Brooks, CMA; 07/06/2020 3:33 PM) Alcohol use Moderate alcohol use. Caffeine use Carbonated beverages, Tea. Tobacco use Current some day smoker.  Family History (Michelle R. Brooks, CMA; 07/06/2020 3:33 PM) Arthritis Brother, Mother, Sister. Cerebrovascular Accident Brother. Diabetes Mellitus Mother, Sister. Melanoma Brother. Prostate Cancer Father.  Pregnancy /  Birth History (Michelle R. Brooks, CMA; 07/06/2020 3:33 PM) Age at menarche 13 years. Gravida 1 Irregular periods Maternal age 26-30 Para 1  Other Problems (Michelle R. Brooks, CMA; 07/06/2020 3:33 PM) Anxiety Disorder Arthritis Back Pain Bladder Problems Breast Cancer Depression Gastroesophageal Reflux Disease High blood pressure Hypercholesterolemia     Physical Exam (Prabhleen Montemayor A. Jodene Polyak MD; 07/06/2020 3:48 PM)  General Mental Status-Alert. General Appearance-Consistent with stated age. Hydration-Well hydrated. Voice-Normal.  Breast Note: Right breast incision clean dry and intact. Right axillary incision clean dry and intact. The port site is clean dry and intact.  Left breast is normal  Neurologic Neurologic evaluation reveals -alert and oriented x 3 with no impairment of recent or remote memory. Mental Status-Normal.    Assessment & Plan (Kham Zuckerman A. Sandeep Radell MD; 07/06/2020 3:47 PM)  BREAST CANCER, STAGE 1, RIGHT (C50.911) Impression: Completing chemotherapy Scheduled for port removal. She is also done with radiation therapy.   PORT-A-CATH IN PLACE (Z95.828) Impression: schedule port removal risk of bleeding infection catheter embolization / fragmentation and exacerbation of underlying medical problems  Current Plans Pt Education - [facility] General Post-op HCI I recommended surgery to remove the catheter. I explained the technique of removal with use of local anesthesia & possible need for more aggressive sedation/anesthesia for patient comfort.  Risks such as bleeding, infection, and other risks were discussed. Post-operative dressing/incision care was discussed. I noted a good likelihood this will help address the problem. We will work to minimize complications. Questions were answered. The patient expresses understanding & wishes to proceed with surgery. 

## 2020-07-06 NOTE — H&P (Signed)
[name] Appointment: 07/06/2020 3:20 PM Location: [facility] Patient #: [id number] DOB: 1950-08-01 Divorced / Language: Cleophus Molt / Race: Black or African American Female  History of Present Illness Marcello Moores A. Cassandra Mcmanaman MD; 07/06/2020 3:47 PM) Patient words: The patient returns for follow-up of her right breast cancer. She is completing chemotherapy and needs her port removed. Overall, she is tolerating chemotherapy and radiation therapy well.  The patient is a 70 year old female.   Problem List/Past Medical Sharyn Lull R. Brooks, CMA; 07/06/2020 3:33 PM) BREAST CANCER, STAGE 1, RIGHT (C50.911)  Past Surgical History Sharyn Lull R. Rolena Infante, CMA; 07/06/2020 3:33 PM) Breast Biopsy Right. Colon Polyp Removal - Colonoscopy Foot Surgery Bilateral. Gallbladder Surgery - Laparoscopic Hysterectomy (not due to cancer) - Partial Spinal Surgery - Lower Back  Diagnostic Studies History Sharyn Lull R. Rolena Infante, CMA; 07/06/2020 3:33 PM) Colonoscopy 5-10 years ago Mammogram within last year Pap Smear >5 years ago  Allergies Sharyn Lull R. Rolena Infante, CMA; 07/06/2020 3:33 PM) No Known Drug Allergies [09/09/2019]:  Medication History Sharyn Lull R. Brooks, CMA; 07/06/2020 3:33 PM) Sertraline HCl (100MG  Tablet, Oral) Active. Rosuvastatin Calcium (10MG  Tablet, Oral) Active. hydroCHLOROthiazide (25MG  Tablet, Oral) Active. amLODIPine Besy-Benazepril HCl (10-20MG  Capsule, Oral) Active. Eszopiclone (3MG  Tablet, Oral) Active. ALPRAZolam (0.5MG  Tablet, Oral) Active. Medications Reconciled  Social History Sharyn Lull R. Brooks, CMA; 07/06/2020 3:33 PM) Alcohol use Moderate alcohol use. Caffeine use Carbonated beverages, Tea. Tobacco use Current some day smoker.  Family History Sharyn Lull R. Rolena Infante, CMA; 07/06/2020 3:33 PM) Arthritis Brother, Mother, Sister. Cerebrovascular Accident Brother. Diabetes Mellitus Mother, Sister. Melanoma Brother. Prostate Cancer Father.  Pregnancy /  Birth History Sharyn Lull R. Rolena Infante, CMA; 07/06/2020 3:33 PM) Age at menarche 45 years. Gravida 1 Irregular periods Maternal age 17-30 Para 1  Other Problems Sharyn Lull R. Rolena Infante, CMA; 07/06/2020 3:33 PM) Anxiety Disorder Arthritis Back Pain Bladder Problems Breast Cancer Depression Gastroesophageal Reflux Disease High blood pressure Hypercholesterolemia     Physical Exam (Caylee Vlachos A. Janiyah Beery MD; 07/06/2020 3:48 PM)  General Mental Status-Alert. General Appearance-Consistent with stated age. Hydration-Well hydrated. Voice-Normal.  Breast Note: Right breast incision clean dry and intact. Right axillary incision clean dry and intact. The port site is clean dry and intact.  Left breast is normal  Neurologic Neurologic evaluation reveals -alert and oriented x 3 with no impairment of recent or remote memory. Mental Status-Normal.    Assessment & Plan (Ronnisha Felber A. Brunella Wileman MD; 07/06/2020 3:47 PM)  BREAST CANCER, STAGE 1, RIGHT (C50.911) Impression: Completing chemotherapy Scheduled for port removal. She is also done with radiation therapy.   PORT-A-CATH IN PLACE [phone]) Impression: schedule port removal risk of bleeding infection catheter embolization / fragmentation and exacerbation of underlying medical problems  Current Plans Pt Education - [facility] General Post-op HCI I recommended surgery to remove the catheter. I explained the technique of removal with use of local anesthesia & possible need for more aggressive sedation/anesthesia for patient comfort.  Risks such as bleeding, infection, and other risks were discussed. Post-operative dressing/incision care was discussed. I noted a good likelihood this will help address the problem. We will work to minimize complications. Questions were answered. The patient expresses understanding & wishes to proceed with surgery.

## 2020-07-08 ENCOUNTER — Other Ambulatory Visit: Payer: Self-pay

## 2020-07-08 ENCOUNTER — Encounter (HOSPITAL_BASED_OUTPATIENT_CLINIC_OR_DEPARTMENT_OTHER): Payer: Self-pay | Admitting: Surgery

## 2020-07-10 ENCOUNTER — Encounter (HOSPITAL_BASED_OUTPATIENT_CLINIC_OR_DEPARTMENT_OTHER)
Admission: RE | Admit: 2020-07-10 | Discharge: 2020-07-10 | Disposition: A | Discharge status: Home / Self Care | Payer: Medicare PPO | Source: Ambulatory Visit | Attending: Surgery | Admitting: Surgery

## 2020-07-10 DIAGNOSIS — Z01812 Encounter for preprocedural laboratory examination: Secondary | ICD-10-CM | POA: Insufficient documentation

## 2020-07-10 LAB — CBC WITH DIFFERENTIAL/PLATELET
Abs Immature Granulocytes: 0.01 K/uL (ref 0.00–0.07)
Basophils Absolute: 0 K/uL (ref 0.0–0.1)
Basophils Relative: 1 %
Eosinophils Absolute: 0.1 K/uL (ref 0.0–0.5)
Eosinophils Relative: 1 %
HCT: 39.1 % (ref 36.0–46.0)
Hemoglobin: 13 g/dL (ref 12.0–15.0)
Immature Granulocytes: 0 %
Lymphocytes Relative: 22 %
Lymphs Abs: 1.2 K/uL (ref 0.7–4.0)
MCH: 29.4 pg (ref 26.0–34.0)
MCHC: 33.2 g/dL (ref 30.0–36.0)
MCV: 88.5 fL (ref 80.0–100.0)
Monocytes Absolute: 0.4 K/uL (ref 0.1–1.0)
Monocytes Relative: 8 %
Neutro Abs: 3.6 K/uL (ref 1.7–7.7)
Neutrophils Relative %: 68 %
Platelets: 235 K/uL (ref 150–400)
RBC: 4.42 MIL/uL (ref 3.87–5.11)
RDW: 16.2 % — ABNORMAL HIGH (ref 11.5–15.5)
WBC: 5.3 K/uL (ref 4.0–10.5)
nRBC: 0 % (ref 0.0–0.2)

## 2020-07-10 LAB — COMPREHENSIVE METABOLIC PANEL WITH GFR
ALT: 26 U/L (ref 0–44)
AST: 36 U/L (ref 15–41)
Albumin: 3.6 g/dL (ref 3.5–5.0)
Alkaline Phosphatase: 115 U/L (ref 38–126)
Anion gap: 9 (ref 5–15)
BUN: 10 mg/dL (ref 8–23)
CO2: 25 mmol/L (ref 22–32)
Calcium: 9.2 mg/dL (ref 8.9–10.3)
Chloride: 104 mmol/L (ref 98–111)
Creatinine, Ser: 0.89 mg/dL (ref 0.44–1.00)
GFR, Estimated: >60 mL/min
Glucose, Bld: 100 mg/dL — ABNORMAL HIGH (ref 70–99)
Potassium: 3 mmol/L — ABNORMAL LOW (ref 3.5–5.1)
Sodium: 138 mmol/L (ref 135–145)
Total Bilirubin: 0.4 mg/dL (ref 0.3–1.2)
Total Protein: 7.5 g/dL (ref 6.5–8.1)

## 2020-07-10 NOTE — Progress Notes (Signed)

## 2020-07-15 ENCOUNTER — Ambulatory Visit (HOSPITAL_BASED_OUTPATIENT_CLINIC_OR_DEPARTMENT_OTHER)
Admission: RE | Admit: 2020-07-15 | Discharge: 2020-07-15 | Disposition: A | Discharge status: Home / Self Care | Payer: Medicare PPO | Attending: Surgery | Admitting: Surgery

## 2020-07-15 ENCOUNTER — Encounter (HOSPITAL_BASED_OUTPATIENT_CLINIC_OR_DEPARTMENT_OTHER)
Admission: RE | Disposition: A | Discharge status: Home / Self Care | Payer: Self-pay | Source: Home / Self Care | Attending: Surgery

## 2020-07-15 ENCOUNTER — Ambulatory Visit (HOSPITAL_BASED_OUTPATIENT_CLINIC_OR_DEPARTMENT_OTHER): Payer: Medicare PPO | Admitting: Anesthesiology

## 2020-07-15 ENCOUNTER — Other Ambulatory Visit: Payer: Self-pay

## 2020-07-15 ENCOUNTER — Encounter (HOSPITAL_BASED_OUTPATIENT_CLINIC_OR_DEPARTMENT_OTHER): Payer: Self-pay | Admitting: Surgery

## 2020-07-15 DIAGNOSIS — Z452 Encounter for adjustment and management of vascular access device: Secondary | ICD-10-CM | POA: Diagnosis present

## 2020-07-15 DIAGNOSIS — Z923 Personal history of irradiation: Secondary | ICD-10-CM | POA: Diagnosis not present

## 2020-07-15 DIAGNOSIS — Z853 Personal history of malignant neoplasm of breast: Secondary | ICD-10-CM | POA: Diagnosis not present

## 2020-07-15 DIAGNOSIS — Z79899 Other long term (current) drug therapy: Secondary | ICD-10-CM | POA: Diagnosis not present

## 2020-07-15 DIAGNOSIS — F17200 Nicotine dependence, unspecified, uncomplicated: Secondary | ICD-10-CM | POA: Insufficient documentation

## 2020-07-15 DIAGNOSIS — Z9221 Personal history of antineoplastic chemotherapy: Secondary | ICD-10-CM | POA: Diagnosis not present

## 2020-07-15 HISTORY — DX: Snoring: R06.83

## 2020-07-15 HISTORY — PX: PORT-A-CATH REMOVAL: SHX5289

## 2020-07-15 SURGERY — REMOVAL PORT-A-CATH
Anesthesia: Monitor Anesthesia Care | Site: Chest | Laterality: Right

## 2020-07-15 MED ORDER — ONDANSETRON HCL 4 MG/2ML IJ SOLN
INTRAMUSCULAR | Status: AC
  Filled 2020-07-15: qty 2

## 2020-07-15 MED ORDER — FENTANYL CITRATE (PF) 100 MCG/2ML IJ SOLN: 25.0000 ug | INTRAMUSCULAR | Status: DC | PRN

## 2020-07-15 MED ORDER — CHLORHEXIDINE GLUCONATE CLOTH 2 % EX PADS: 6.0000 | MEDICATED_PAD | Freq: Once | CUTANEOUS | Status: DC

## 2020-07-15 MED ORDER — BUPIVACAINE HCL (PF) 0.25 % IJ SOLN
INTRAMUSCULAR | Status: DC | PRN
  Administered 2020-07-15: 10 mL

## 2020-07-15 MED ORDER — ACETAMINOPHEN 500 MG PO TABS
ORAL_TABLET | ORAL | Status: AC
  Filled 2020-07-15: qty 2

## 2020-07-15 MED ORDER — CLINDAMYCIN PHOSPHATE 900 MG/50ML IV SOLN
INTRAVENOUS | Status: AC
  Filled 2020-07-15: qty 50

## 2020-07-15 MED ORDER — ACETAMINOPHEN 500 MG PO TABS
1000.0000 mg | ORAL_TABLET | ORAL | Status: AC
  Administered 2020-07-15: 1000 mg via ORAL

## 2020-07-15 MED ORDER — LACTATED RINGERS IV SOLN: INTRAVENOUS | Status: DC

## 2020-07-15 MED ORDER — CLINDAMYCIN PHOSPHATE 900 MG/50ML IV SOLN
900.0000 mg | INTRAVENOUS | Status: AC
  Administered 2020-07-15: 900 mg via INTRAVENOUS

## 2020-07-15 MED ORDER — PROPOFOL 500 MG/50ML IV EMUL
INTRAVENOUS | Status: DC | PRN
  Administered 2020-07-15: 25 ug/kg/min via INTRAVENOUS

## 2020-07-15 MED ORDER — MIDAZOLAM HCL 5 MG/5ML IJ SOLN
INTRAMUSCULAR | Status: DC | PRN
  Administered 2020-07-15: 2 mg via INTRAVENOUS

## 2020-07-15 MED ORDER — PROPOFOL 10 MG/ML IV BOLUS
INTRAVENOUS | Status: DC | PRN
  Administered 2020-07-15: 20 mg via INTRAVENOUS

## 2020-07-15 MED ORDER — FENTANYL CITRATE (PF) 100 MCG/2ML IJ SOLN
INTRAMUSCULAR | Status: AC
  Filled 2020-07-15: qty 2

## 2020-07-15 MED ORDER — FENTANYL CITRATE (PF) 100 MCG/2ML IJ SOLN
INTRAMUSCULAR | Status: DC | PRN
  Administered 2020-07-15: 25 ug via INTRAVENOUS

## 2020-07-15 MED ORDER — MIDAZOLAM HCL 2 MG/2ML IJ SOLN
INTRAMUSCULAR | Status: AC
  Filled 2020-07-15: qty 2

## 2020-07-15 MED ORDER — AMISULPRIDE (ANTIEMETIC) 5 MG/2ML IV SOLN: 10.0000 mg | Freq: Once | INTRAVENOUS | Status: DC | PRN

## 2020-07-15 MED ORDER — PROPOFOL 500 MG/50ML IV EMUL
INTRAVENOUS | Status: AC
  Filled 2020-07-15: qty 50

## 2020-07-15 SURGICAL SUPPLY — 36 items
ADH SKN CLS APL DERMABOND .7 (GAUZE/BANDAGES/DRESSINGS) ×1
APL PRP STRL LF DISP 70% ISPRP (MISCELLANEOUS) ×1
APL SKNCLS STERI-STRIP NONHPOA (GAUZE/BANDAGES/DRESSINGS)
BENZOIN TINCTURE PRP APPL 2/3 (GAUZE/BANDAGES/DRESSINGS) IMPLANT
BLADE SURG 15 STRL LF DISP TIS (BLADE) ×1 IMPLANT
BLADE SURG 15 STRL SS (BLADE) ×2
CHLORAPREP W/TINT 26 (MISCELLANEOUS) ×2 IMPLANT
COVER BACK TABLE 60X90IN (DRAPES) ×2 IMPLANT
COVER MAYO STAND STRL (DRAPES) ×2 IMPLANT
COVER WAND RF STERILE (DRAPES) IMPLANT
DECANTER SPIKE VIAL GLASS SM (MISCELLANEOUS) ×2 IMPLANT
DERMABOND ADVANCED (GAUZE/BANDAGES/DRESSINGS) ×1
DERMABOND ADVANCED .7 DNX12 (GAUZE/BANDAGES/DRESSINGS) ×1 IMPLANT
DRAPE LAPAROTOMY 100X72 PEDS (DRAPES) ×2 IMPLANT
DRAPE UTILITY XL STRL (DRAPES) ×2 IMPLANT
ELECT REM PT RETURN 9FT ADLT (ELECTROSURGICAL) ×2
ELECTRODE REM PT RTRN 9FT ADLT (ELECTROSURGICAL) ×1 IMPLANT
GLOVE SRG 8 PF TXTR STRL LF DI (GLOVE) ×1 IMPLANT
GLOVE SURG LTX SZ8 (GLOVE) ×2 IMPLANT
GLOVE SURG UNDER POLY LF SZ8 (GLOVE) ×2
GOWN STRL REUS W/ TWL LRG LVL3 (GOWN DISPOSABLE) ×2 IMPLANT
GOWN STRL REUS W/ TWL XL LVL3 (GOWN DISPOSABLE) ×1 IMPLANT
GOWN STRL REUS W/TWL LRG LVL3 (GOWN DISPOSABLE)
GOWN STRL REUS W/TWL XL LVL3 (GOWN DISPOSABLE) ×4
NDL HYPO 25X1 1.5 SAFETY (NEEDLE) ×1 IMPLANT
NEEDLE HYPO 25X1 1.5 SAFETY (NEEDLE) ×2 IMPLANT
NS IRRIG 1000ML POUR BTL (IV SOLUTION) ×2 IMPLANT
PACK BASIN DAY SURGERY FS (CUSTOM PROCEDURE TRAY) ×2 IMPLANT
PENCIL SMOKE EVACUATOR (MISCELLANEOUS) ×1 IMPLANT
SLEEVE SCD COMPRESS KNEE MED (STOCKING) ×1 IMPLANT
SPONGE LAP 4X18 RFD (DISPOSABLE) ×2 IMPLANT
STRIP CLOSURE SKIN 1/2X4 (GAUZE/BANDAGES/DRESSINGS) IMPLANT
SUT MON AB 4-0 PC3 18 (SUTURE) ×2 IMPLANT
SUT VICRYL 3-0 CR8 SH (SUTURE) ×2 IMPLANT
SYR CONTROL 10ML LL (SYRINGE) ×2 IMPLANT
TOWEL GREEN STERILE FF (TOWEL DISPOSABLE) ×2 IMPLANT

## 2020-07-15 NOTE — Anesthesia Postprocedure Evaluation (Signed)
Anesthesia Post Note  Patient: [name]  Procedure(s) Performed: REMOVAL PORT-A-CATH (Right Chest)     Patient location during evaluation: PACU Anesthesia Type: MAC Level of consciousness: awake and alert Pain management: pain level controlled Vital Signs Assessment: post-procedure vital signs reviewed and stable Respiratory status: spontaneous breathing, nonlabored ventilation, respiratory function stable and patient connected to nasal cannula oxygen Cardiovascular status: stable and blood pressure returned to baseline Postop Assessment: no apparent nausea or vomiting Anesthetic complications: no   No complications documented.  Last Vitals:  Vitals:   07/15/20 0905 07/15/20 0915  BP: 98/68 101/70  Pulse: (!) 53 (!) 51  Resp: 18 18  Temp: 36.7 C   SpO2: 99% 100%    Last Pain:  Vitals:   07/15/20 0915  TempSrc:   PainSc: 0-No pain                 Tiajuana Amass

## 2020-07-15 NOTE — Op Note (Signed)
Preop diagnosis: Indwelling port a catheter for chemotherapy  Postop diagnosis: Same  Procedure: Removal of port a catheter  Surgeon: Faryal Marxen M.D.  Anesthesia: MAC with local 0.25 % marcaine   EBL: Minimal  Specimen none  Drains: None  Indications for procedure: The patient presents for removal of port a catheter after completing chemotherapy. The patient no longer requires central venous access. Risks of bleeding, infection, catheter fragmentation, embolization, arrhythmias and damage to arteries, veins and nerves and possibly other mediastinal structures discussed. The patient agrees to proceed.  Description of procedure: The patient was seen in the holding area. Questions were answered. The patient agreed to proceed. The patient was taken to the operating room. The patient was placed supine. Anesthesia was initiated. The skin on the upper chest was prepped and draped in a sterile fashion. Timeout was done. The patient received preoperative antibiotics. Incision was made through the old port site and the hub of the Port-A-Cath was seen. The sutures were cut to release the port from the chest wall. The catheter was removed in its entirety without difficulty. The tract was closed with 3-0 Vicryl. 4 Monocryl was used to close the skin. All final counts were correct. The patient was taken to recovery in satisfactory condition. 

## 2020-07-15 NOTE — Transfer of Care (Signed)
Immediate Anesthesia Transfer of Care Note  Patient: [name]  Procedure(s) Performed: REMOVAL PORT-A-CATH (Right Chest)  Patient Location: PACU  Anesthesia Type:MAC  Level of Consciousness: drowsy, patient cooperative and responds to stimulation  Airway & Oxygen Therapy: Patient Spontanous Breathing and Patient connected to face mask oxygen  Post-op Assessment: Report given to RN and Post -op Vital signs reviewed and stable  Post vital signs: Reviewed and stable  Last Vitals:  Vitals Value Taken Time  BP    Temp    Pulse    Resp    SpO2      Last Pain:  Vitals:   07/15/20 0728  TempSrc: Oral  PainSc: 10-Worst pain ever      Patients Stated Pain Goal: 6 (41/32/44 0102)  Complications: No complications documented.

## 2020-07-15 NOTE — Interval H&P Note (Signed)
History and Physical Interval Note:  07/15/2020 8:23 AM  [name]  has presented today for surgery, with the diagnosis of PORT IN PLACE.  The various methods of treatment have been discussed with the patient and family. After consideration of risks, benefits and other options for treatment, the patient has consented to  Procedure(s): REMOVAL PORT-A-CATH (Right) as a surgical intervention.  The patient's history has been reviewed, patient examined, no change in status, stable for surgery.  I have reviewed the patient's chart and labs.  Questions were answered to the patient's satisfaction.     [place]

## 2020-07-15 NOTE — Discharge Instructions (Signed)
GENERAL SURGERY: POST OP INSTRUCTIONS  ######################################################################  EAT Gradually transition to a high fiber diet with a fiber supplement over the next few weeks after discharge.  Start with a pureed / full liquid diet (see below)  WALK Walk an hour a day.  Control your pain to do that.    CONTROL PAIN Control pain so that you can walk, sleep, tolerate sneezing/coughing, go up/down stairs.  HAVE A BOWEL MOVEMENT DAILY Keep your bowels regular to avoid problems.  OK to try a laxative to override constipation.  OK to use an antidairrheal to slow down diarrhea.  Call if not better after 2 tries  CALL IF YOU HAVE PROBLEMS/CONCERNS Call if you are still struggling despite following these instructions. Call if you have concerns not answered by these instructions  ######################################################################    1. DIET: Follow a light bland diet & liquids the first 24 hours after arrival home, such as soup, liquids, starches, etc.  Be sure to drink plenty of fluids.  Quickly advance to a usual solid diet within a few days.  Avoid fast food or heavy meals as your are more likely to get nauseated or have irregular bowels.  A low-fat, high-fiber diet for the rest of your life is ideal.   2. Take your usually prescribed home medications unless otherwise directed. 3. PAIN CONTROL: a. Pain is best controlled by a usual combination of three different methods TOGETHER: i. Ice/Heat ii. Over the counter pain medication iii. Prescription pain medication b. Most patients will experience some swelling and bruising around the incisions.  Ice packs or heating pads (30-60 minutes up to 6 times a day) will help. Use ice for the first few days to help decrease swelling and bruising, then switch to heat to help relax tight/sore spots and speed recovery.  Some people prefer to use ice alone, heat alone, alternating between ice & heat.   Experiment to what works for you.  Swelling and bruising can take several weeks to resolve.   c. It is helpful to take an over-the-counter pain medication regularly for the first few weeks.  Choose one of the following that works best for you: i. Naproxen (Aleve, etc)  Two 220mg tabs twice a day ii. Ibuprofen (Advil, etc) Three 200mg tabs four times a day (every meal & bedtime) iii. Acetaminophen (Tylenol, etc) 500-650mg four times a day (every meal & bedtime) d. A  prescription for pain medication (such as oxycodone, hydrocodone, etc) should be given to you upon discharge.  Take your pain medication as prescribed.  i. If you are having problems/concerns with the prescription medicine (does not control pain, nausea, vomiting, rash, itching, etc), please call us [phone] to see if we need to switch you to a different pain medicine that will work better for you and/or control your side effect better. ii. If you need a refill on your pain medication, please contact your pharmacy.  They will contact our office to request authorization. Prescriptions will not be filled after 5 pm or on week-ends. 4. Avoid getting constipated.  Between the surgery and the pain medications, it is common to experience some constipation.  Increasing fluid intake and taking a fiber supplement (such as Metamucil, Citrucel, FiberCon, MiraLax, etc) 1-2 times a day regularly will usually help prevent this problem from occurring.  A mild laxative (prune juice, Milk of Magnesia, MiraLax, etc) should be taken according to package directions if there are no bowel movements after 48 hours.   5. Wash /   shower every day.  You may shower over the dressings as they are waterproof.  Continue to shower over incision(s) after the dressing is off. 6. Remove your waterproof bandages 5 days after surgery.  You may leave the incision open to air.  You may have skin tapes (Steri Strips) covering the incision(s).  Leave them on until one week, then  remove.  You may replace a dressing/Band-Aid to cover the incision for comfort if you wish.      7. ACTIVITIES as tolerated:   a. You may resume regular (light) daily activities beginning the next day--such as daily self-care, walking, climbing stairs--gradually increasing activities as tolerated.  If you can walk 30 minutes without difficulty, it is safe to try more intense activity such as jogging, treadmill, bicycling, low-impact aerobics, swimming, etc. b. Save the most intensive and strenuous activity for last such as sit-ups, heavy lifting, contact sports, etc  Refrain from any heavy lifting or straining until you are off narcotics for pain control.   c. DO NOT PUSH THROUGH PAIN.  Let pain be your guide: If it hurts to do something, don't do it.  Pain is your body warning you to avoid that activity for another week until the pain goes down. d. You may drive when you are no longer taking prescription pain medication, you can comfortably wear a seatbelt, and you can safely maneuver your car and apply brakes. e. Dennis Bast may have sexual intercourse when it is comfortable.  8. FOLLOW UP in our office a. Please call [facility] at [phone] to set up an appointment to see your surgeon in the office for a follow-up appointment approximately 2-3 weeks after your surgery. b. Make sure that you call for this appointment the day you arrive home to insure a convenient appointment time. 9. IF YOU HAVE DISABILITY OR FAMILY LEAVE FORMS, BRING THEM TO THE OFFICE FOR PROCESSING.  DO NOT GIVE THEM TO YOUR DOCTOR.   WHEN TO CALL us [phone]: 1. Poor pain control 2. Reactions / problems with new medications (rash/itching, nausea, etc)  3. Fever over 101.5 F (38.5 C) 4. Worsening swelling or bruising 5. Continued bleeding from incision. 6. Increased pain, redness, or drainage from the incision 7. Difficulty breathing / swallowing   The clinic staff is available to answer your questions during regular  business hours (8:30am-5pm).  Please don't hesitate to call and ask to speak to one of our nurses for clinical concerns.   If you have a medical emergency, go to the nearest emergency room or call 911.  A surgeon from [facility] is always on call at the [facility], [address], [place], [place], [place]  [address] ? MAIN: [phone] ? TOLL FREE: [phone] ?  FAX [phone] [email or web address]   Post Anesthesia Home Care Instructions  Activity: Get plenty of rest for the remainder of the day. A responsible individual must stay with you for 24 hours following the procedure.  For the next 24 hours, DO NOT: -Drive a car -Paediatric nurse -Drink alcoholic beverages -Take any medication unless instructed by your physician -Make any legal decisions or sign important papers.  Meals: Start with liquid foods such as gelatin or soup. Progress to regular foods as tolerated. Avoid greasy, spicy, heavy foods. If nausea and/or vomiting occur, drink only clear liquids until the nausea and/or vomiting subsides. Call your physician if vomiting continues.  Special Instructions/Symptoms: Your throat may feel dry or sore  from the anesthesia or the breathing tube placed in your throat during surgery. If this causes discomfort, gargle with warm salt water. The discomfort should disappear within 24 hours.  If you had a scopolamine patch placed behind your ear for the management of post- operative nausea and/or vomiting:  1. The medication in the patch is effective for 72 hours, after which it should be removed.  Wrap patch in a tissue and discard in the trash. Wash hands thoroughly with soap and water. 2. You may remove the patch earlier than 72 hours if you experience unpleasant side effects which may include dry mouth, dizziness or visual disturbances. 3. Avoid touching the patch. Wash your hands with soap and water after contact  with the patch  No tylenol today until after 1:30pm if needed.

## 2020-07-15 NOTE — Anesthesia Procedure Notes (Signed)
Procedure Name: MAC Date/Time: 07/15/2020 8:32 AM Performed by: Glory Buff, CRNA Oxygen Delivery Method: Simple face mask

## 2020-07-15 NOTE — Anesthesia Preprocedure Evaluation (Addendum)
Anesthesia Evaluation  Patient identified by MRN, date of birth, ID band Patient awake    Reviewed: Allergy & Precautions, NPO status , Patient's Chart, lab work & pertinent test results  Airway Mallampati: II  TM Distance: >3 FB Neck ROM: Full    Dental  (+) Dental Advisory Given   Pulmonary former smoker,    breath sounds clear to auscultation       Cardiovascular hypertension, Pt. on medications  Rhythm:Regular Rate:Normal     Neuro/Psych negative neurological ROS     GI/Hepatic Neg liver ROS, GERD  ,  Endo/Other  negative endocrine ROS  Renal/GU negative Renal ROS     Musculoskeletal  (+) Arthritis ,   Abdominal   Peds  Hematology negative hematology ROS (+)   Anesthesia Other Findings   Reproductive/Obstetrics                             Anesthesia Physical Anesthesia Plan  ASA: II  Anesthesia Plan: MAC   Post-op Pain Management:    Induction: Intravenous  PONV Risk Score and Plan: 2 and Propofol infusion and Treatment may vary due to age or medical condition  Airway Management Planned: Natural Airway and Simple Face Mask  Additional Equipment:   Intra-op Plan:   Post-operative Plan:   Informed Consent: I have reviewed the patients History and Physical, chart, labs and discussed the procedure including the risks, benefits and alternatives for the proposed anesthesia with the patient or authorized representative who has indicated his/her understanding and acceptance.       Plan Discussed with: CRNA  Anesthesia Plan Comments:         Anesthesia Quick Evaluation

## 2020-07-16 ENCOUNTER — Encounter (HOSPITAL_BASED_OUTPATIENT_CLINIC_OR_DEPARTMENT_OTHER): Payer: Self-pay | Admitting: Surgery

## 2020-07-30 ENCOUNTER — Inpatient Hospital Stay: Payer: Medicare PPO | Attending: Hematology and Oncology | Admitting: Adult Health

## 2020-07-30 ENCOUNTER — Other Ambulatory Visit: Payer: Self-pay

## 2020-07-30 VITALS — BP 119/67 | HR 79 | Temp 97.6°F | Resp 18 | Ht 63.0 in | Wt 204.6 lb

## 2020-07-30 DIAGNOSIS — Z170 Estrogen receptor positive status [ER+]: Secondary | ICD-10-CM

## 2020-07-30 DIAGNOSIS — C50411 Malignant neoplasm of upper-outer quadrant of right female breast: Secondary | ICD-10-CM | POA: Insufficient documentation

## 2020-07-30 DIAGNOSIS — Z79811 Long term (current) use of aromatase inhibitors: Secondary | ICD-10-CM | POA: Diagnosis not present

## 2020-07-30 NOTE — Progress Notes (Signed)
SURVIVORSHIP VISIT:    BRIEF ONCOLOGIC HISTORY:  Oncology History  Malignant neoplasm of upper-outer quadrant of right breast in female, estrogen receptor positive ([place])  09/17/2019 Initial Diagnosis   Screening mammogram detected a right breast mass, not palpable on exam. Diagnostic mammogram showed 1.0cm mass at the 12:30 position in the right breast, with a mildly abnormal right axillary lymph node, 4.10m. Biopsy showed IDC in the breast, grade 3, HER-2 negative (1+), ER+ 70%, PR- 0%, Ki67 85%, and the lymph node negative for carcinoma.   09/24/2019 Cancer Staging   Staging form: Breast, AJCC 8th Edition - Clinical stage from 09/24/2019: Stage IB (cT1b, cN0(f), cM0, G3, ER+, PR-, HER2-)   10/01/2019 Surgery   Right lumpectomy ([facility]): IDC, grade 3, 2.8cm, clear margins, 7 right axillary lymph nodes negative for carcinoma.   10/23/2019 Oncotype testing   Oncotype DX recurrence score 68: Greater than 39% risk of distant recurrence at 9 years   11/13/2019 - 02/26/2020 Chemotherapy   Cytoxan x 6 (11/13/2019 - 02/26/2020) Adrucil x 6 (11/13/2019 - 02/26/2020) Methotrexate x 6 (11/13/2019 - 02/26/2020)   04/01/2020 - 04/28/2020 Radiation Therapy   The patient initially received a dose of 42.56 Gy in 16 fractions to the breast using whole-breast tangent fields. This was delivered using a 3-D conformal technique. The pt received a boost delivering an additional 8 Gy in 4 fractions using a electron boost with 111m electrons. The total dose was 50.56 Gy.   04/2020 - 04/2027 Anti-estrogen oral therapy   Anastrozole     INTERVAL HISTORY:  Ms. BrPeeterso review her survivorship care plan detailing her treatment course for breast cancer, as well as monitoring long-term side effects of that treatment, education regarding health maintenance, screening, and overall wellness and health promotion.     Overall, Ms. BrCranmoreeports feeling quite well.  She is taking Anastrozole daily and is tolerating it quite  well.  She denies any new issues.    REVIEW OF SYSTEMS:  Review of Systems  Constitutional:  Negative for appetite change, chills, fatigue, fever and unexpected weight change.  HENT:   Negative for hearing loss, lump/mass and trouble swallowing.   Eyes:  Negative for eye problems and icterus.  Respiratory:  Negative for chest tightness, cough and shortness of breath.   Cardiovascular:  Negative for chest pain, leg swelling and palpitations.  Gastrointestinal:  Negative for abdominal distention, abdominal pain, constipation, diarrhea, nausea and vomiting.  Endocrine: Negative for hot flashes.  Genitourinary:  Negative for difficulty urinating.   Musculoskeletal:  Negative for arthralgias.  Skin:  Negative for itching and rash.  Neurological:  Negative for dizziness, extremity weakness, headaches and numbness.  Hematological:  Negative for adenopathy. Does not bruise/bleed easily.  Psychiatric/Behavioral:  Negative for depression. The patient is not nervous/anxious.   Breast: Denies any new nodularity, masses, tenderness, nipple changes, or nipple discharge.    ONCOLOGY TREATMENT TEAM:  1. [facility]  [facility] 2. Medical Oncologist: Dr. GuLindi Adie3. Radiation Oncologist: Dr. MoLisbeth Renshaw  PAST MEDICAL/SURGICAL HISTORY:  Past Medical History:  Diagnosis Date   Anxiety    Arthritis    [place]   right breast IDC   Depression    GERD (gastroesophageal reflux disease)    Hernia    umbilical   Hyperlipidemia    Hypertension    Snores    Past Surgical History:  Procedure Laterality Date   ABDOMINAL HYSTERECTOMY     Partial   BACK  SURGERY     4 Bilateral Titanium Screws near L4 and L5   BREAST LUMPECTOMY WITH RADIOACTIVE SEED AND SENTINEL LYMPH NODE BIOPSY Right 10/01/2019   Procedure: RIGHT BREAST LUMPECTOMY WITH RADIOACTIVE SEED AND SENTINEL LYMPH NODE BIOPSY;  Surgeon: Erroll Luna, MD;  Location: [place];  Service:  General;  Laterality: Right;  PEC BLOCK   CHOLECYSTECTOMY     EXCISION / CURETTAGE BONE CYST PHALANGES OF FOOT Right    HEEL SPUR EXCISION Left    PORT-A-CATH REMOVAL Right 07/15/2020   Procedure: REMOVAL PORT-A-CATH;  Surgeon: Erroll Luna, MD;  Location: [place];  Service: General;  Laterality: Right;   PORTACATH PLACEMENT Right 11/12/2019   Procedure: INSERTION PORT-A-CATH WITH ULTRASOUND GUIDANCE, ACCESSED;  Surgeon: Erroll Luna, MD;  Location: [place];  Service: General;  Laterality: Right;   SPINAL CORD STIMULATOR INSERTION  09/26/2012   Dr Rolena Infante   SPINAL CORD STIMULATOR INSERTION N/A 09/26/2012   Procedure: SPINAL CORD STIMULATOR PLACEMENT;  Surgeon: Melina Schools, MD;  Location: [place];  Service: Orthopedics;  Laterality: N/A;   TONSILLECTOMY       ALLERGIES:  Allergies  Allergen Reactions   Ceftin [Cefuroxime] Other (See Comments)    Renal or swelling, patient cannot remember what happens    Zofran [Ondansetron] Other (See Comments)    Swelling      CURRENT MEDICATIONS:  Outpatient Encounter Medications as of 07/30/2020  Medication Sig   ALPRAZolam (XANAX) 0.5 MG tablet Take 0.25-0.5 mg by mouth 2 (two) times daily as needed. For anxiety   amLODipine-benazepril (LOTREL) 10-20 MG per capsule Take 1 capsule by mouth daily.   anastrozole (ARIMIDEX) 1 MG tablet Take 1 tablet (1 mg total) by mouth daily.   cholecalciferol (VITAMIN D) 400 UNITS TABS Take 400 Units by mouth daily.   eszopiclone 3 MG TABS Take 1 tablet (3 mg total) by mouth at bedtime as needed for sleep. Take immediately before bedtime   hydrochlorothiazide (HYDRODIURIL) 25 MG tablet Take 25 mg by mouth daily.   omeprazole (PRILOSEC) 20 MG capsule Take 1 capsule (20 mg total) by mouth daily.   rosuvastatin (CRESTOR) 10 MG tablet    sertraline (ZOLOFT) 100 MG tablet Take 200 mg by mouth daily.   No facility-administered encounter medications on file as of 07/30/2020.      ONCOLOGIC FAMILY HISTORY:  Family History  Problem Relation Age of Onset   Diabetes Mother    Cancer Father        prostate   Emphysema Maternal Grandfather      GENETIC COUNSELING/TESTING: Not at this time  SOCIAL HISTORY:  Social History   Socioeconomic History   Marital status: Divorced    Spouse name: Not on file   Number of children: Not on file   Years of education: Not on file   Highest education level: Not on file  Occupational History   Not on file  Tobacco Use   Smoking status: Former    Packs/day: 0.25    Years: 30.00    Pack years: 7.50    Types: Cigarettes, E-cigarettes    Quit date: 07/26/2011    Years since quitting: 9.0   Smokeless tobacco: Never   Tobacco comments:    uses e-cigarette/ occ wine,  marijuna occ  Substance and Sexual Activity   Alcohol use: Yes    Comment: socially   Drug use: Yes    Types: Marijuana    Comment: last smoked 07-03-20  Sexual activity: Not Currently    Birth control/protection: Surgical  Other Topics Concern   Not on file  Social History Narrative   Not on file   Social Determinants of Health   Financial Resource Strain: Not on file  Food Insecurity: Not on file  Transportation Needs: Not on file  Physical Activity: Not on file  Stress: Not on file  Social Connections: Not on file  Intimate Partner Violence: Not on file     OBSERVATIONS/OBJECTIVE:  BP 119/67 (BP Location: Left Arm, Patient Position: Sitting)   Pulse 79   Temp 97.6 F (36.4 C) (Temporal)   Resp 18   Ht '5\' 3"'  (1.6 m)   Wt 204 lb 9.6 oz (92.8 kg)   SpO2 100%   BMI 36.24 kg/m  GENERAL: Patient is a well appearing female in no acute distress HEENT:  Sclerae anicteric.  Oropharynx clear and moist. No ulcerations or evidence of oropharyngeal candidiasis. Neck is supple.  NODES:  No cervical, supraclavicular, or axillary lymphadenopathy palpated.  BREAST EXAM: right breast s/p lumpectomy and radiation, no sign of local recurrence,  left breast benign LUNGS:  Clear to auscultation bilaterally.  No wheezes or rhonchi. HEART:  Regular rate and rhythm. No murmur appreciated. ABDOMEN:  Soft, nontender.  Positive, normoactive bowel sounds. No organomegaly palpated. MSK:  No focal spinal tenderness to palpation. Full range of motion bilaterally in the upper extremities. EXTREMITIES:  No peripheral edema.   SKIN:  Clear with no obvious rashes or skin changes. No nail dyscrasia. NEURO:  Nonfocal. Well oriented.  Appropriate affect.  LABORATORY DATA:  None for this visit.  DIAGNOSTIC IMAGING:  None for this visit.      ASSESSMENT AND PLAN:  Ms.. Cosner is a pleasant 70 y.o. female with Stage IIA right breast invasive ductal carcinoma, ER+/PR-/HER2-, diagnosed in 09/2019, treated with lumpectomy, adjuvant chemotherapy, adjuvant radiation therapy, and anti-estrogen therapy with Anastrozole beginning in 04/2020.  She presents to the Survivorship Clinic for our initial meeting and routine follow-up post-completion of treatment for breast cancer.    1. Stage IIA right breast cancer:  Ms. Kolek is continuing to recover from definitive treatment for breast cancer. She will follow-up with her medical oncologist, Dr. Lindi Adie in 6 months with history and physical exam per surveillance protocol.  She will continue her anti-estrogen therapy with Anatrozole. Thus far, she is tolerating the Anastrozole well, with minimal side effects. She was instructed to make Dr. Lindi Adie or myself aware if she begins to experience any worsening side effects of the medication and I could see her back in clinic to help manage those side effects, as needed. Her mammogram is due 10/2020; orders placed today. Today, a comprehensive survivorship care plan and treatment summary was reviewed with the patient today detailing her breast cancer diagnosis, treatment course, potential late/long-term effects of treatment, appropriate follow-up care with recommendations for the  future, and patient education resources.  A copy of this summary, along with a letter will be sent to the patient's primary care provider via mail/fax/In Basket message after today's visit.    2. Bone health:  Given Ms. Vanderpool's age/history of breast cancer and her current treatment regimen including anti-estrogen therapy with Anastrozole, she is at risk for bone demineralization.  She is scheduled for bone density testing in 10/2020.  In the meantime, she was encouraged to increase her consumption of foods rich in calcium, as well as increase her weight-bearing activities.  She was given education on specific activities to promote  bone health.  3. Cancer screening:  Due to Ms. Newbold's history and her age, she should receive screening for skin cancers, colon cancer, and gynecologic cancers.  The information and recommendations are listed on the patient's comprehensive care plan/treatment summary and were reviewed in detail with the patient.    4. Health maintenance and wellness promotion: Ms. Hulick was encouraged to consume 5-7 servings of fruits and vegetables per day. We reviewed the "Nutrition Rainbow" handout, as well as the handout "Take Control of Your Health and Reduce Your Cancer Risk" from the [employer].  She was also encouraged to engage in moderate to vigorous exercise for 30 minutes per day most days of the week. We discussed the [employer] fitness program, which is designed for cancer survivors to help them become more physically fit after cancer treatments.  She was instructed to limit her alcohol consumption and continue to abstain from tobacco use.     5. Support services/counseling: It is not uncommon for this period of the patient's cancer care trajectory to be one of many emotions and stressors.  We discussed how this can be increasingly difficult during the times of quarantine and social distancing due to the COVID-19 pandemic.   She was given information regarding our  available services and encouraged to contact me with any questions or for help enrolling in any of our support group/programs.    Follow up instructions:    -Return to cancer center 6 months for f/u with Dr. Lindi Adie  -Mammogram due in 10/2020 -Follow up with surgery  -She is welcome to return back to the Survivorship Clinic at any time; no additional follow-up needed at this time.  -Consider referral back to survivorship as a long-term survivor for continued surveillance  The patient was provided an opportunity to ask questions and all were answered. The patient agreed with the plan and demonstrated an understanding of the instructions.    Total encounter time: 30 minutes in chart review, SCP preparation, face to face visit time, order entry, communication and collaboration with care team and documentation of the encounter.Wilber Bihari, NP 07/30/20 1:25 PM Medical Oncology and Hematology [facility] [place], [place] [address] Tel. [phone]    Fax. [phone]  *Total Encounter Time as defined by the Centers for Medicare and Medicaid Services includes, in addition to the face-to-face time of a patient visit (documented in the note above) non-face-to-face time: obtaining and reviewing outside history, ordering and reviewing medications, tests or procedures, care coordination (communications with other health care professionals or caregivers) and documentation in the medical record.

## 2020-07-31 ENCOUNTER — Encounter: Payer: Self-pay | Admitting: Hematology and Oncology

## 2020-07-31 ENCOUNTER — Encounter: Payer: Self-pay | Admitting: Adult Health

## 2020-08-31 ENCOUNTER — Telehealth: Payer: Self-pay

## 2020-08-31 NOTE — Telephone Encounter (Signed)
Pt's daughter called with concerns that pt's right breast is swollen. Pt's daughter denies redness, heat, fever, pain. Mostly discomfort from swelling. This LPN advised York Cerise to call Dr Cornett's office to see if pt could be evaluated there today, and if not to call us back. York Cerise verbalized thanks and understanding.

## 2020-09-18 ENCOUNTER — Other Ambulatory Visit: Payer: Self-pay | Admitting: Student

## 2020-09-18 DIAGNOSIS — I890 Lymphedema, not elsewhere classified: Secondary | ICD-10-CM

## 2020-10-02 ENCOUNTER — Other Ambulatory Visit: Payer: Self-pay

## 2020-10-02 ENCOUNTER — Ambulatory Visit
Admission: RE | Admit: 2020-10-02 | Discharge: 2020-10-02 | Disposition: A | Discharge status: Home / Self Care | Payer: Medicare PPO | Source: Ambulatory Visit | Attending: Student | Admitting: Student

## 2020-10-02 DIAGNOSIS — I890 Lymphedema, not elsewhere classified: Secondary | ICD-10-CM

## 2020-10-05 IMAGING — CR DG CHEST 1V PORT
2 series · 2 of 2 positions shown · non-contrast
Comparison: September 24, 2012

CLINICAL DATA: Port-A-Cath placement.

EXAM:
PORTABLE CHEST 1 VIEW

[chest ap (1 of 2)]
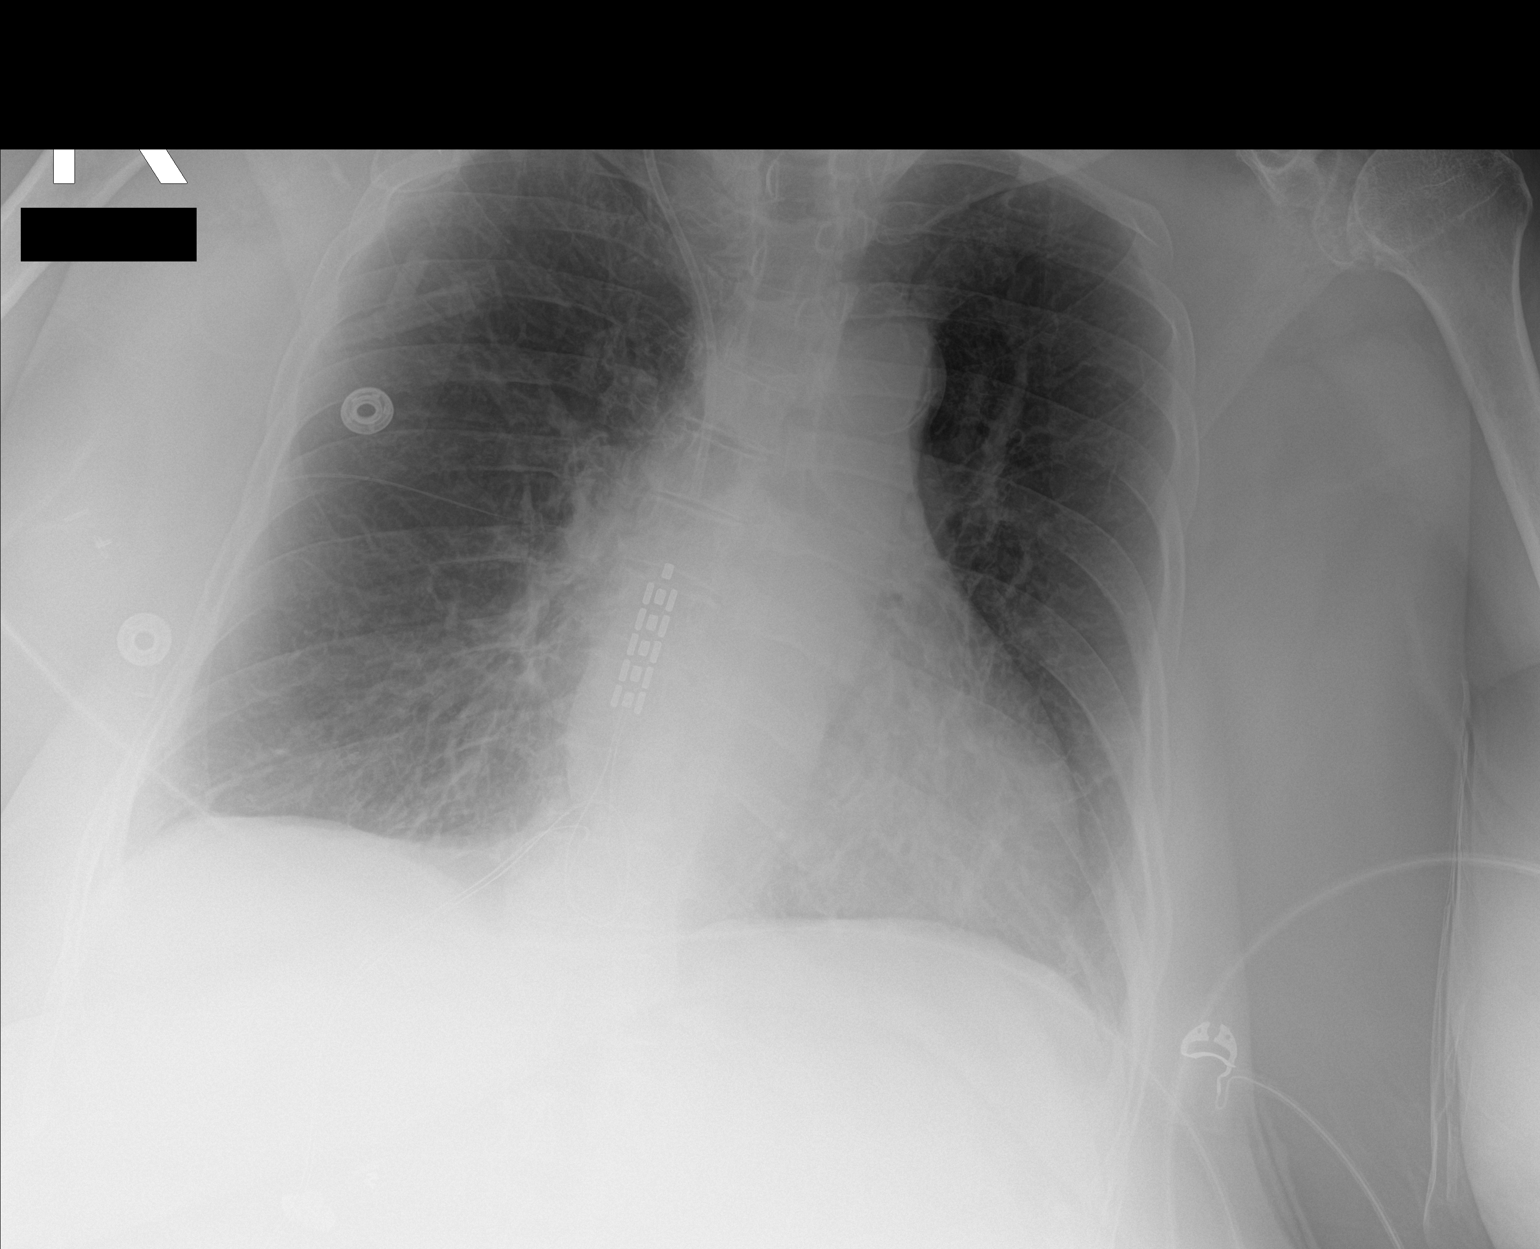

[chest ap (2 of 2)]
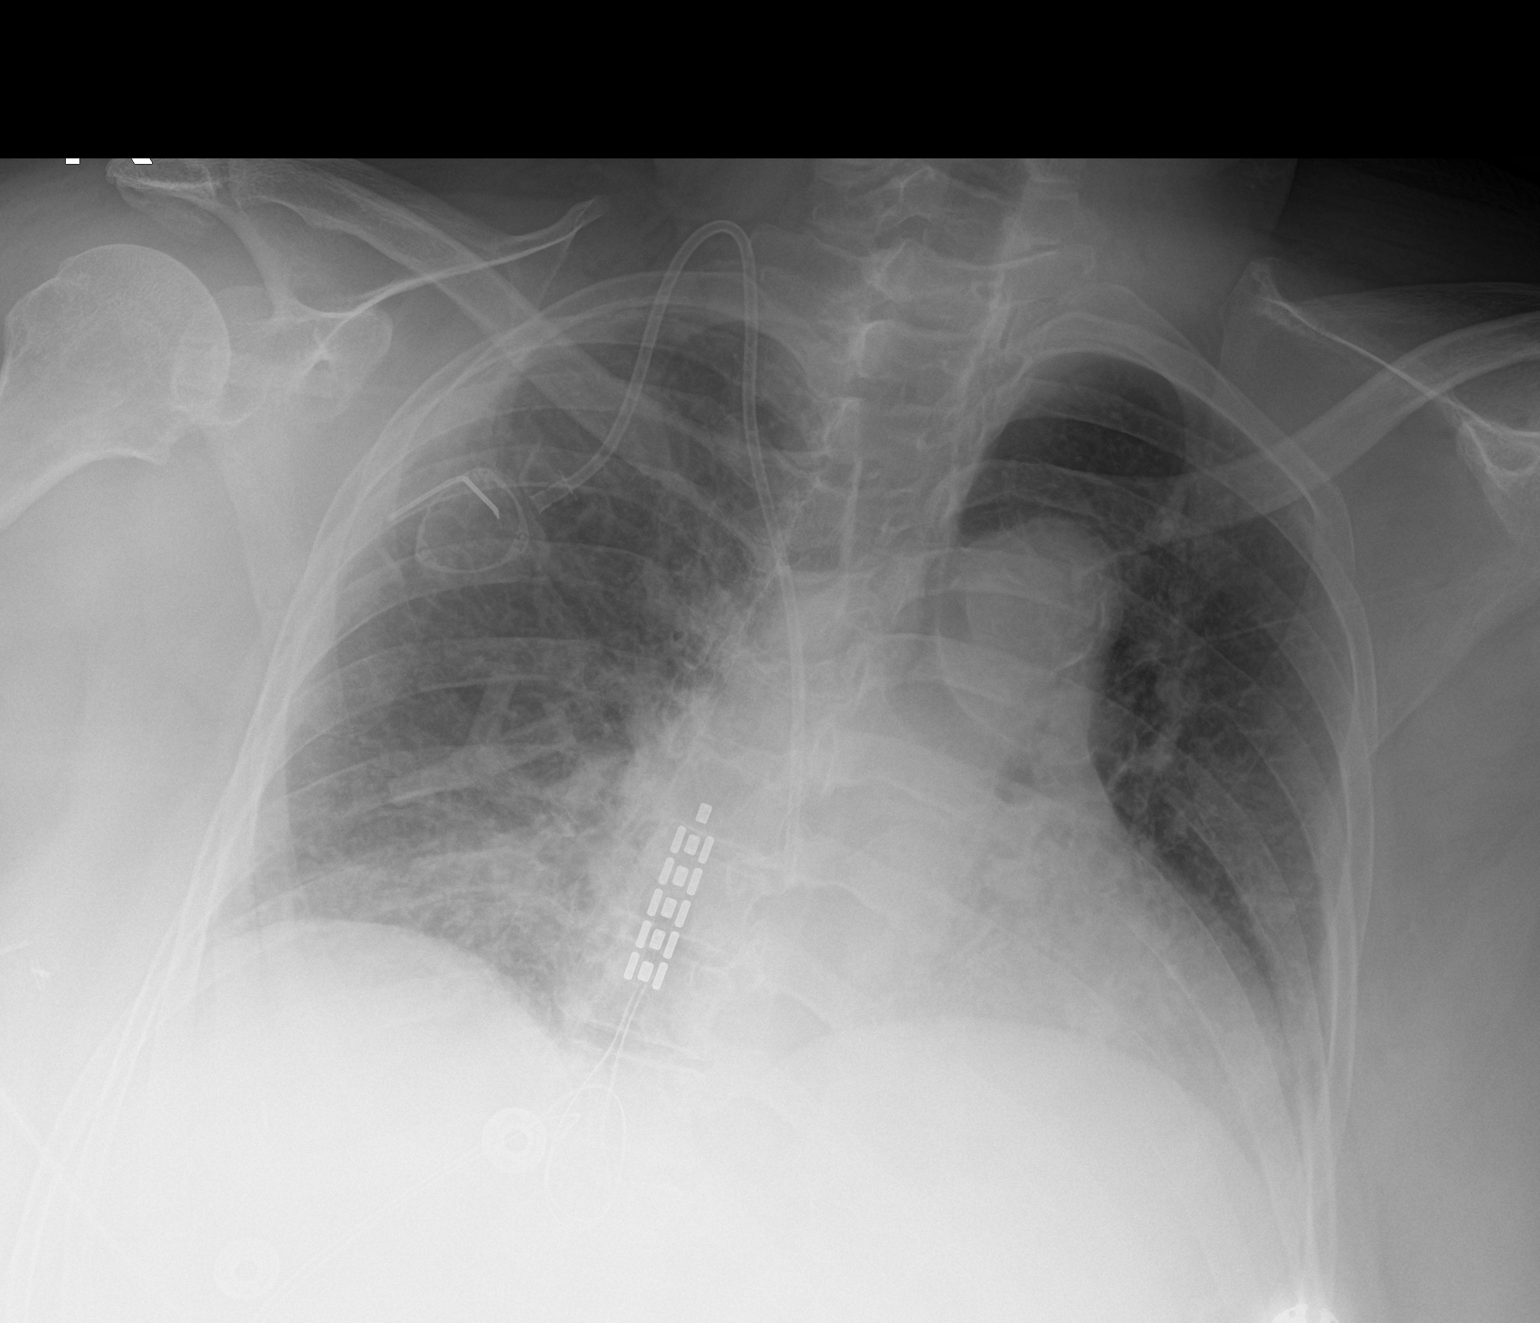

[2 of 2 positions shown; findings below may reference images not displayed]

FINDINGS: Since the prior study there is been interval placement of a
right-sided venous Port-A-Cath. Its distal tip is seen within the
distal aspect of the superior vena cava. Stable, mild to moderate
severity diffuse chronic appearing increased interstitial lung
markings are seen. There is no evidence of acute infiltrate, pleural
effusion or pneumothorax. The heart size and mediastinal contours
are within normal limits. A spinal stimulator wire is seen with its
distal tip at the level of T8-T9.
IMPRESSION: Interval right-sided venous Port-A-Cath placement, as described
above.

## 2020-10-08 ENCOUNTER — Ambulatory Visit: Payer: Medicare PPO | Admitting: Physical Therapy

## 2020-10-22 ENCOUNTER — Other Ambulatory Visit: Payer: Medicare PPO

## 2021-01-28 ENCOUNTER — Inpatient Hospital Stay: Payer: Medicare PPO | Admitting: Hematology and Oncology

## 2021-02-18 ENCOUNTER — Encounter: Payer: Self-pay | Admitting: Hematology and Oncology

## 2021-02-24 NOTE — Assessment & Plan Note (Signed)
10/01/2019:Right lumpectomy ([facility]): IDC, grade 3, 2.8cm, clear margins, 7 right axillary lymph nodes negative for carcinoma.ER 90%, PR 0%, Ki-67 85%, HER-2 negative T2N0 stage Ib Oncotype DX recurrence score 68, greater than 39% risk of distant recurrence at 9 years  Treatment plan: 1.Adjuvant chemotherapy withCMF(chosen because of PS issues) completed 02/26/20 2. Adjuvant radiation therapy completed 04/28/20 3. Adjuvant antiestrogen therapy Anastrozole 04/27/20  CT of the chest abdomen and pelvis: 09/30/2019: Right breast lesion, internal mammary lymph nodes, upper abdominal nodal enlargement (probably related to liver cirrhosis) small lung nodules ------------------------------------------------------------------------------------------------------------------------------------------ Current treatment: Anastrozole Anastrozole Toxicities:  Breast Cancer Surveillance: 1. Breast Exam: 02/25/21: Benign 2. Mammograms: 10/02/20: Benign Density Cat B  RTC in 1 year

## 2021-02-25 ENCOUNTER — Other Ambulatory Visit: Payer: Self-pay

## 2021-02-25 ENCOUNTER — Inpatient Hospital Stay: Payer: Medicare HMO | Attending: Hematology and Oncology | Admitting: Hematology and Oncology

## 2021-02-25 ENCOUNTER — Encounter: Payer: Self-pay | Admitting: Hematology and Oncology

## 2021-02-25 DIAGNOSIS — Z170 Estrogen receptor positive status [ER+]: Secondary | ICD-10-CM | POA: Diagnosis not present

## 2021-02-25 DIAGNOSIS — C50411 Malignant neoplasm of upper-outer quadrant of right female breast: Secondary | ICD-10-CM | POA: Insufficient documentation

## 2021-02-25 DIAGNOSIS — Z79811 Long term (current) use of aromatase inhibitors: Secondary | ICD-10-CM | POA: Insufficient documentation

## 2021-02-25 NOTE — Progress Notes (Signed)
Patient Care Team: Everardo Beals, NP as PCP - General Nicholas Lose, MD as Consulting Physician (Hematology and Oncology) Kyung Rudd, MD as Consulting Physician (Radiation Oncology) Erroll Luna, MD as Consulting Physician (General Surgery)  DIAGNOSIS:  Encounter Diagnosis  Name Primary?   Malignant neoplasm of upper-outer quadrant of right breast in female, estrogen receptor positive ([place])     SUMMARY OF ONCOLOGIC HISTORY: Oncology History  Malignant neoplasm of upper-outer quadrant of right breast in female, estrogen receptor positive ([place])  09/17/2019 Initial Diagnosis   Screening mammogram detected a right breast mass, not palpable on exam. Diagnostic mammogram showed 1.0cm mass at the 12:30 position in the right breast, with a mildly abnormal right axillary lymph node, 4.54m. Biopsy showed IDC in the breast, grade 3, HER-2 negative (1+), ER+ 70%, PR- 0%, Ki67 85%, and the lymph node negative for carcinoma.   09/24/2019 Cancer Staging   Staging form: Breast, AJCC 8th Edition - Clinical stage from 09/24/2019: Stage IB (cT1b, cN0(f), cM0, G3, ER+, PR-, HER2-)   10/01/2019 Surgery   Right lumpectomy ([facility]): IDC, grade 3, 2.8cm, clear margins, 7 right axillary lymph nodes negative for carcinoma.   10/23/2019 Oncotype testing   Oncotype DX recurrence score 68: Greater than 39% risk of distant recurrence at 9 years   11/13/2019 - 02/26/2020 Chemotherapy   Cytoxan x 6 (11/13/2019 - 02/26/2020) Adrucil x 6 (11/13/2019 - 02/26/2020) Methotrexate x 6 (11/13/2019 - 02/26/2020)   04/01/2020 - 04/28/2020 Radiation Therapy   The patient initially received a dose of 42.56 Gy in 16 fractions to the breast using whole-breast tangent fields. This was delivered using a 3-D conformal technique. The pt received a boost delivering an additional 8 Gy in 4 fractions using a electron boost with 1105m electrons. The total dose was 50.56 Gy.   04/2020 - 04/2027 Anti-estrogen oral therapy    Anastrozole     CHIEF COMPLIANT: Follow-up on anastrozole therapy  INTERVAL HISTORY: [name] a [age]ear old with above-mentioned history of right breast cancer who is currently on antiestrogen therapy with anastrozole and appears to be tolerating it extremely well.  She denies any lumps or nodules in the breast.   ALLERGIES:  is allergic to ceftin [cefuroxime] and zofran [ondansetron].  MEDICATIONS:  Current Outpatient Medications  Medication Sig Dispense Refill   ALPRAZolam (XANAX) 0.5 MG tablet Take 0.25-0.5 mg by mouth 2 (two) times daily as needed. For anxiety     amLODipine-benazepril (LOTREL) 10-20 MG per capsule Take 1 capsule by mouth daily.     anastrozole (ARIMIDEX) 1 MG tablet Take 1 tablet (1 mg total) by mouth daily. 90 tablet 3   cholecalciferol (VITAMIN D) 400 UNITS TABS Take 400 Units by mouth daily.     eszopiclone 3 MG TABS Take 1 tablet (3 mg total) by mouth at bedtime as needed for sleep. Take immediately before bedtime     hydrochlorothiazide (HYDRODIURIL) 25 MG tablet Take 25 mg by mouth daily.     omeprazole (PRILOSEC) 20 MG capsule Take 1 capsule (20 mg total) by mouth daily.     rosuvastatin (CRESTOR) 10 MG tablet      sertraline (ZOLOFT) 100 MG tablet Take 200 mg by mouth daily.     No current facility-administered medications for this visit.    PHYSICAL EXAMINATION: ECOG PERFORMANCE STATUS: 1 - Symptomatic but completely ambulatory  Vitals:   02/25/21 1453  BP: 139/74  Pulse: 89  Resp: 18  Temp: 98.2 F (36.8 C)  SpO2: 99%  Filed Weights   02/25/21 1453  Weight: 203 lb 14.4 oz (92.5 kg)    BREAST: No palpable masses or nodules in either right or left breasts. No palpable axillary supraclavicular or infraclavicular adenopathy no breast tenderness or nipple discharge. (exam performed in the presence of a chaperone)  LABORATORY DATA:  I have reviewed the data as listed CMP Latest Ref Rng & Units 07/10/2020 02/26/2020 02/05/2020  Glucose  70 - 99 mg/dL 100(H) 102(H) 100(H)  BUN 8 - 23 mg/dL '10 17 17  ' Creatinine 0.44 - 1.00 mg/dL 0.89 0.89 0.88  Sodium 135 - 145 mmol/L 138 139 142  Potassium 3.5 - 5.1 mmol/L 3.0(L) 3.7 4.0  Chloride 98 - 111 mmol/L 104 104 108  CO2 22 - 32 mmol/L '25 26 24  ' Calcium 8.9 - 10.3 mg/dL 9.2 9.2 9.2  Total Protein 6.5 - 8.1 g/dL 7.5 7.5 7.5  Total Bilirubin 0.3 - 1.2 mg/dL 0.4 0.6 0.4  Alkaline Phos 38 - 126 U/L 115 118 116  AST 15 - 41 U/L 36 24 30  ALT 0 - 44 U/L '26 14 20    ' Lab Results  Component Value Date   WBC 5.3 07/10/2020   HGB 13.0 07/10/2020   HCT 39.1 07/10/2020   MCV 88.5 07/10/2020   PLT 235 07/10/2020   NEUTROABS 3.6 07/10/2020    ASSESSMENT & PLAN:  Malignant neoplasm of upper-outer quadrant of right breast in female, estrogen receptor positive ([place]) 10/01/2019:Right lumpectomy ([facility]): IDC, grade 3, 2.8cm, clear margins, 7 right axillary lymph nodes negative for carcinoma.  ER 90%, PR 0%, Ki-67 85%, HER-2 negative T2N0 stage Ib Oncotype DX recurrence score 68, greater than 39% risk of distant recurrence at 9 years   Treatment plan: 1. Adjuvant chemotherapy with CMF (chosen because of PS issues) completed 02/26/20 2. Adjuvant radiation therapy completed 04/28/20 3. Adjuvant antiestrogen therapy Anastrozole 04/27/20   CT of the chest abdomen and pelvis: 09/30/2019: Right breast lesion, internal mammary lymph nodes, upper abdominal nodal enlargement (probably related to liver cirrhosis) small lung nodules ------------------------------------------------------------------------------------------------------------------------------------------ Current treatment: Anastrozole Anastrozole Toxicities: Tolerating it extremely well without any problems.  Breast Cancer Surveillance: 1. Breast Exam: 02/25/21: Benign 2. Mammograms: 10/02/20: Benign Density Cat B  We might consider repeating another CT scan for continued monitoring of the internal mammary nodes as well as small lung  nodules. RTC in 1 year   Orders Placed This Encounter  Procedures   Ambulatory referral to Physical Therapy    Referral Priority:   Routine    Referral Type:   Physical Medicine    Referral Reason:   Specialty Services Required    Requested Specialty:   Physical Therapy    Number of Visits Requested:   1   The patient has a good understanding of the overall plan. she agrees with it. she will call with any problems that may develop before the next visit here. Total time spent: 30 mins including face to face time and time spent for planning, charting and co-ordination of care   Harriette Ohara, MD 02/25/21

## 2021-03-01 ENCOUNTER — Other Ambulatory Visit: Payer: Self-pay

## 2021-03-01 ENCOUNTER — Ambulatory Visit: Payer: Medicare HMO | Attending: Hematology and Oncology

## 2021-03-01 DIAGNOSIS — R600 Localized edema: Secondary | ICD-10-CM | POA: Insufficient documentation

## 2021-03-01 DIAGNOSIS — Z170 Estrogen receptor positive status [ER+]: Secondary | ICD-10-CM | POA: Diagnosis not present

## 2021-03-01 DIAGNOSIS — N63 Unspecified lump in unspecified breast: Secondary | ICD-10-CM | POA: Diagnosis present

## 2021-03-01 DIAGNOSIS — C50411 Malignant neoplasm of upper-outer quadrant of right female breast: Secondary | ICD-10-CM | POA: Insufficient documentation

## 2021-03-01 DIAGNOSIS — R293 Abnormal posture: Secondary | ICD-10-CM | POA: Insufficient documentation

## 2021-03-01 NOTE — Therapy (Addendum)
[facility] @ [address] [place], [place], [address] Phone: [phone]   Fax:  [phone]  Physical Therapy Evaluation  Patient Details  Name: REIGAN TOLLIVER MRN: [id number] Date of Birth: 13-Aug-1950 Referring Provider (PT): Dr. Lindi Adie   Encounter Date: 03/01/2021   PT End of Session - 03/01/21 1715     Visit Number 1    Number of Visits 12    Date for PT Re-Evaluation 04/12/21    Authorization Type [employer]    PT Start Time 3235    PT Stop Time 5732    PT Time Calculation (min) 42 min    Activity Tolerance Patient tolerated treatment well    Behavior During Therapy [facility] for tasks assessed/performed             Past Medical History:  Diagnosis Date   Anxiety    Arthritis    Cancer ([place]) 09/2019   right breast IDC   Depression    GERD (gastroesophageal reflux disease)    Hernia    umbilical   Hyperlipidemia    Hypertension    Snores     Past Surgical History:  Procedure Laterality Date   ABDOMINAL HYSTERECTOMY     Partial   BACK SURGERY     4 Bilateral Titanium Screws near L4 and L5   BREAST LUMPECTOMY WITH RADIOACTIVE SEED AND SENTINEL LYMPH NODE BIOPSY Right 10/01/2019   Procedure: RIGHT BREAST LUMPECTOMY WITH RADIOACTIVE SEED AND SENTINEL LYMPH NODE BIOPSY;  Surgeon: Erroll Luna, MD;  Location: [facility];  Service: General;  Laterality: Right;  PEC BLOCK   CHOLECYSTECTOMY     EXCISION / CURETTAGE BONE CYST PHALANGES OF FOOT Right    HEEL SPUR EXCISION Left    PORT-A-CATH REMOVAL Right 07/15/2020   Procedure: REMOVAL PORT-A-CATH;  Surgeon: Erroll Luna, MD;  Location: [facility];  Service: General;  Laterality: Right;   PORTACATH PLACEMENT Right 11/12/2019   Procedure: INSERTION PORT-A-CATH WITH ULTRASOUND GUIDANCE, ACCESSED;  Surgeon: Erroll Luna, MD;  Location: [facility];  Service: General;  Laterality: Right;   SPINAL CORD STIMULATOR  INSERTION  09/26/2012   Dr Rolena Infante   SPINAL CORD STIMULATOR INSERTION N/A 09/26/2012   Procedure: SPINAL CORD STIMULATOR PLACEMENT;  Surgeon: Melina Schools, MD;  Location: [facility];  Service: Orthopedics;  Laterality: N/A;   TONSILLECTOMY      There were no vitals filed for this visit.    Subjective Assessment - 03/01/21 1602     Subjective Pt with complaints of Right breast swelling for a number of months. Swelling has progressively gotten worse. Still has pain/tenderness where biopsy was and under armpit.  Breast itself is not really painful. Breast feels heavy. pt has an appt scheduled for [facility] on 03/12/2021 to get a compression bra.    Patient is accompained by: Family member   daughter Roselyn Reef   Pertinent History diagnosed with Rt ER positive, PR/HER2 negative IDC with lumpectomy and SLNB on 10/01/19 with 0/7 lymph nodes removed.  other health history of HTN, anxiety, hysterectomy, 6-8 lumbar surgeries over the years including,spinal stimulator that was removed and pt reports only the leads are in place now. Has OA all over. Can't hold arms up long    Limitations Walking    Patient Stated Goals Decrease breast swelling/ heaviness    Currently in Pain? Yes    Pain Score 9     Pain Location Back  Pain Orientation Lower    Pain Descriptors / Indicators Constant;Aching    Pain Type Chronic pain    Pain Onset More than a month ago    Pain Frequency Constant    Multiple Pain Sites No                [facility] PT Assessment - 03/01/21 0001       Assessment   Medical Diagnosis Rt breast cancer    Referring Provider (PT) Dr. Lindi Adie    Onset Date/Surgical Date 10/01/19    Hand Dominance Right      Precautions   Precaution Comments lymphedema      Restrictions   Weight Bearing Restrictions No   but uses a rolling walker for balance     Balance Screen   Has the patient fallen in the past 6 months Yes    How many times? 1    Has the patient had a decrease in activity level  because of a fear of falling?  No    Is the patient reluctant to leave their home because of a fear of falling?  No      Home Environment   Living Environment Private residence    Living Arrangements Children    Available Help at Discharge Family    Type of [place] Two level;Bed/bath upstairs;1/2 bath on main level      Prior Function   Level of [place] Retired;On disability    Leisure nothing, play on phone, watch TV      Cognition   Overall Cognitive Status Within Functional Limits for tasks assessed      Observation/Other Assessments   Observations Visibly increased swelling in right breast with hyperpigmentation noted from prior radiation.  Pt with enlarged pores, and mild pitting at inferior breast, keloid scars noted from biopsy and portacatch      Posture/Postural Control   Posture/Postural Control Postural limitations    Postural Limitations Rounded Shoulders;Forward head;Increased lumbar lordosis;Increased thoracic kyphosis      AROM   AROM Assessment Site Shoulder    Right/Left Shoulder Right    Right Shoulder Flexion 155 Degrees    Right Shoulder ABduction 165 Degrees      Ambulation/Gait   Assistive device Rolling walker    Gait Comments limited mobility due to back pain               LYMPHEDEMA/ONCOLOGY QUESTIONNAIRE - 03/01/21 0001       Type   Cancer Type Rt. breast Cancer      Surgeries   Lumpectomy Date 10/01/19    Axillary Lymph Node Dissection Date 10/01/19    Number Lymph Nodes Removed 7      What other symptoms do you have   Are you Having Heaviness or Tightness Yes    Are you having Pain Yes    Are you having pitting edema Yes    Is it Hard or Difficult finding clothes that fit No    Do you have infections No    Is there Decreased scar mobility Yes      Lymphedema Assessments   Lymphedema Assessments Upper extremities                     Objective measurements  completed on examination: See above findings.                PT Education - 03/01/21  [id number]     Education Details pt and her daughter were educated in lymphatic system and role of lymphatics and light pressure for MLD.    Person(s) Educated Patient;Child(ren)                 PT Long Term Goals - 03/01/21 1722       PT LONG TERM GOAL #1   Title Pt will report decreased right breast heaviness by atleast 50%    Time 6    Period Weeks    Status New    Target Date 04/12/21      PT LONG TERM GOAL #2   Title Pt will be independent in self MLD to decrease right breast swelling    Time 6    Period Weeks    Status New    Target Date 04/12/21      PT LONG TERM GOAL #3   Title Pt will be fit for compression bra and will note some improvement in swelling    Time 6    Period Weeks    Status New    Target Date 04/12/21                   measure Plan - 03/01/21 1715     Clinical Impression Statement Pt reports to therapy with her daughter with complaints of right breast progressive swelling. She was scheduled to come in last August, but cancelled the appt and her breast has gotten progressively larger.  She has hyperpigmentation from radiation, and enlarged pores.  There is mild pitting edema at the lower breast.  She has an appt to get fit for a compression bra on 03/12/2021. She will benefit from skilled PT to address breast swelling and to instruct pt in MLD for independant self management.  She is also willing to have her demographics sent to [facility] for flexi touch. Sent today.   Personal Factors and Comorbidities Age;Fitness    Examination-Activity Limitations Stand;Locomotion Level;Bend;Carry    Stability/Clinical Decision Making Stable/Uncomplicated    Clinical Decision Making Low    Rehab Potential Excellent    PT Frequency 2x / week    PT Duration 6 weeks    PT Treatment/Interventions Taping;Vasopneumatic Device;Manual techniques;Manual lymph  drainage;Patient/family education;Therapeutic exercise;Scar mobilization    PT Next Visit Plan Measure circumference of breast for [facility],initiate MLD and start instructing pt.    Consulted and Agree with Plan of Care Patient;Family member/caregiver    Family Member Consulted daughter Roselyn Reef (needs 4:00 appts             Patient will benefit from skilled therapeutic intervention in order to improve the following deficits and impairments:  Postural dysfunction, Increased edema, Decreased knowledge of precautions, Pain  Visit Diagnosis: Localized edema - Plan: PT plan of care cert/re-cert  Malignant neoplasm of upper-outer quadrant of right breast in female, estrogen receptor positive ([place])  Breast swelling - Plan: PT plan of care cert/re-cert  Abnormal posture - Plan: PT plan of care cert/re-cert     Problem List Patient Active Problem List   Diagnosis Date Noted   Port-A-Cath in place 11/21/2019   Malignant neoplasm of upper-outer quadrant of right breast in female, estrogen receptor positive ([place]) 09/17/2019   AKI (acute kidney injury) ([place]) 08/03/2014   Nausea with vomiting 08/03/2014   Dehydration 08/03/2014   Bilateral flank pain 05/09/2012   HTN (hypertension) 05/09/2012   GERD (gastroesophageal reflux disease) 05/09/2012   Adjustment disorder with mixed anxiety and depressed mood  05/09/2012    Claris Pong, PT 03/01/2021, 5:45 PM  [facility] @ [place], [place], [address] Phone: [phone]   Fax:  [phone]  Name: ATTALLAH ONTKO MRN: [id number] Date of Birth: 1950/06/26

## 2021-03-01 NOTE — Addendum Note (Signed)
Addended by: Claris Pong on: 03/01/2021 05:48 PM   Modules accepted: Orders

## 2021-03-03 ENCOUNTER — Encounter (HOSPITAL_COMMUNITY): Payer: Self-pay

## 2021-03-04 ENCOUNTER — Ambulatory Visit: Payer: Medicare HMO

## 2021-03-04 ENCOUNTER — Other Ambulatory Visit: Payer: Self-pay

## 2021-03-04 DIAGNOSIS — C50411 Malignant neoplasm of upper-outer quadrant of right female breast: Secondary | ICD-10-CM

## 2021-03-04 DIAGNOSIS — N63 Unspecified lump in unspecified breast: Secondary | ICD-10-CM

## 2021-03-04 DIAGNOSIS — R600 Localized edema: Secondary | ICD-10-CM

## 2021-03-04 DIAGNOSIS — R293 Abnormal posture: Secondary | ICD-10-CM

## 2021-03-04 DIAGNOSIS — Z170 Estrogen receptor positive status [ER+]: Secondary | ICD-10-CM

## 2021-03-04 NOTE — Therapy (Signed)
[facility] @ [address] [place], [place], [address] Phone: [phone]   Fax:  [phone]  Physical Therapy Treatment  Patient Details  Name: [name] MRN: [id number] Date of Birth: 01-Dec-1950 Referring Provider (PT): Dr. Lindi Adie   Encounter Date: 03/04/2021   PT End of Session - 03/04/21 1707     Visit Number 2    Number of Visits 12    Date for PT Re-Evaluation 04/12/21    Authorization Type [employer]    Authorization - Visit Number 12    PT Start Time 1603    PT Stop Time 6294    PT Time Calculation (min) 55 min    Activity Tolerance Patient tolerated treatment well    Behavior During Therapy [facility] for tasks assessed/performed             Past Medical History:  Diagnosis Date   Anxiety    Arthritis    Cancer ([place]) 09/2019   right breast IDC   Depression    GERD (gastroesophageal reflux disease)    Hernia    umbilical   Hyperlipidemia    Hypertension    Snores     Past Surgical History:  Procedure Laterality Date   ABDOMINAL HYSTERECTOMY     Partial   BACK SURGERY     4 Bilateral Titanium Screws near L4 and L5   BREAST LUMPECTOMY WITH RADIOACTIVE SEED AND SENTINEL LYMPH NODE BIOPSY Right 10/01/2019   Procedure: RIGHT BREAST LUMPECTOMY WITH RADIOACTIVE SEED AND SENTINEL LYMPH NODE BIOPSY;  Surgeon: Erroll Luna, MD;  Location: [facility];  Service: General;  Laterality: Right;  PEC BLOCK   CHOLECYSTECTOMY     EXCISION / CURETTAGE BONE CYST PHALANGES OF FOOT Right    HEEL SPUR EXCISION Left    PORT-A-CATH REMOVAL Right 07/15/2020   Procedure: REMOVAL PORT-A-CATH;  Surgeon: Erroll Luna, MD;  Location: [place] [facility];  Service: General;  Laterality: Right;   PORTACATH PLACEMENT Right 11/12/2019   Procedure: INSERTION PORT-A-CATH WITH ULTRASOUND GUIDANCE, ACCESSED;  Surgeon: Erroll Luna, MD;  Location: [facility];  Service: General;  Laterality:  Right;   SPINAL CORD STIMULATOR INSERTION  09/26/2012   Dr Rolena Infante   SPINAL CORD STIMULATOR INSERTION N/A 09/26/2012   Procedure: SPINAL CORD STIMULATOR PLACEMENT;  Surgeon: Melina Schools, MD;  Location: [facility];  Service: Orthopedics;  Laterality: N/A;   TONSILLECTOMY      There were no vitals filed for this visit.   Subjective Assessment - 03/04/21 1602     Subjective Breast continues to feel heavy.  Back is the only thing hurting today and it always hurts.    Patient is accompained by: Family member   daughter Roselyn Reef   Pertinent History diagnosed with Rt ER positive, PR/HER2 negative IDC with lumpectomy and SLNB on 10/01/19 with 0/7 lymph nodes removed.  other health history of HTN, anxiety, hysterectomy, 6-8 lumbar surgeries over the years including,spinal stimulator that was removed and pt reports only the leads are in place now. Has OA all over. Can't hold arms up long    Patient Stated Goals Decrease breast swelling/ heaviness    Pain Score 9     Pain Location Back    Pain Orientation Left    Pain Descriptors / Indicators Aching;Constant    Pain Onset More than a month ago    Pain Frequency Constant    Multiple Pain Sites No  LYMPHEDEMA/ONCOLOGY QUESTIONNAIRE - 03/04/21 0001       Right Upper Extremity Lymphedema   Other Right bust 130 cm                        [facility] Adult PT Treatment/Exercise - 03/04/21 0001       Manual Therapy   Manual therapy comments measured circumference of bilateral breasts    Manual Lymphatic Drainage (MLD) Pt in supine with head of table slightly elevated; MLD to right breast by PT short neck, 5 diaphragmatic breaths, bilateral axillary LN's, right inguinal LN's, anterior interaxillary pathway, right axillo-inguinal pathway, right breast directing medial brest to upper pathway and lateral breast to axillo-inguinal pathway.  Ended with LN's.  Pt was instructed in LN activation and pathways.  She required  multiple VC's and TC's, She had difficulty reacing to her opposite side and might do better in sitting.                          PT Long Term Goals - 03/01/21 1722       PT LONG TERM GOAL #1   Title Pt will report decreased right breast heaviness by atleast 50%    Time 6    Period Weeks    Status New    Target Date 04/12/21      PT LONG TERM GOAL #2   Title Pt will be independent in self MLD to decrease right breast swelling    Time 6    Period Weeks    Status New    Target Date 04/12/21      PT LONG TERM GOAL #3   Title Pt will be fit for compression bra and will note some improvement in swelling    Time 6    Period Weeks    Status New    Target Date 04/12/21                   Plan - 03/04/21 1708     Clinical Impression Statement Initiated MLD to the right breast in supine with head of table elevated.  Pt was educated about the role of lymphatics and the superficial nature and how they respond to stretch.  Therapist performed full right breast sequence and instructed pt while performing.  Pt practiced LN activation and pathways.  She had difficulty reaching to do armpit LN's on left and to do right axillo-inguinal pathway. She did an excellent job with diaphragmatic breathing. written instructions and also a copy of the lymphatic pathways were issued at her request.    Personal Factors and Comorbidities Age    Examination-Activity Limitations Stand;Locomotion Level;Bend;Carry    Stability/Clinical Decision Making Stable/Uncomplicated    Rehab Potential Excellent    PT Frequency 2x / week    PT Duration 6 weeks    PT Treatment/Interventions Taping;Vasopneumatic Device;Manual techniques;Manual lymph drainage;Patient/family education;Therapeutic exercise;Scar mobilization    PT Next Visit Plan continue MLD and pt instruction.  consider sitting for pt to learn techniques so she may reach better.    Consulted and Agree with Plan of Care Patient;Family  member/caregiver    Family Member Consulted daughter Roselyn Reef (needs 4:00 appts             Patient will benefit from skilled therapeutic intervention in order to improve the following deficits and impairments:  Postural dysfunction, Increased edema, Decreased knowledge of precautions, Pain  Visit Diagnosis: Malignant neoplasm of upper-outer quadrant  of right breast in female, estrogen receptor positive ([place])  Localized edema  Breast swelling  Abnormal posture     Problem List Patient Active Problem List   Diagnosis Date Noted   Port-A-Cath in place 11/21/2019   Malignant neoplasm of upper-outer quadrant of right breast in female, estrogen receptor positive ([place]) 09/17/2019   AKI (acute kidney injury) ([place]) 08/03/2014   Nausea with vomiting 08/03/2014   Dehydration 08/03/2014   Bilateral flank pain 05/09/2012   HTN (hypertension) 05/09/2012   GERD (gastroesophageal reflux disease) 05/09/2012   Adjustment disorder with mixed anxiety and depressed mood 05/09/2012    Claris Pong, PT 03/04/2021, 5:16 PM  [facility] @ [place], [place], [address] Phone: [phone]   Fax:  [phone]  Name: JAYLE SOLARZ MRN: [id number] Date of Birth: 08/12/1950

## 2021-03-09 ENCOUNTER — Encounter: Payer: Self-pay | Admitting: Rehabilitation

## 2021-03-09 ENCOUNTER — Ambulatory Visit: Payer: Medicare HMO | Admitting: Rehabilitation

## 2021-03-09 ENCOUNTER — Other Ambulatory Visit: Payer: Self-pay

## 2021-03-09 DIAGNOSIS — R293 Abnormal posture: Secondary | ICD-10-CM

## 2021-03-09 DIAGNOSIS — C50411 Malignant neoplasm of upper-outer quadrant of right female breast: Secondary | ICD-10-CM | POA: Diagnosis not present

## 2021-03-09 DIAGNOSIS — N63 Unspecified lump in unspecified breast: Secondary | ICD-10-CM

## 2021-03-09 DIAGNOSIS — R600 Localized edema: Secondary | ICD-10-CM

## 2021-03-09 NOTE — Therapy (Signed)
[facility] @ [place], [place], [address] Phone: [phone]   Fax:  [phone]  Physical Therapy Treatment  Patient Details  Name: [name] MRN: [id number] Date of Birth: 09-11-1950 Referring Provider (PT): Dr. Lindi Adie   Encounter Date: 03/09/2021   PT End of Session - 03/09/21 1654     Visit Number 3    Number of Visits 12    Date for PT Re-Evaluation 04/12/21    PT Start Time 1602    PT Stop Time 1649    PT Time Calculation (min) 47 min    Activity Tolerance Patient tolerated treatment well    Behavior During Therapy [facility] for tasks assessed/performed             Past Medical History:  Diagnosis Date   Anxiety    Arthritis    Cancer ([place]) 09/2019   right breast IDC   Depression    GERD (gastroesophageal reflux disease)    Hernia    umbilical   Hyperlipidemia    Hypertension    Snores     Past Surgical History:  Procedure Laterality Date   ABDOMINAL HYSTERECTOMY     Partial   BACK SURGERY     4 Bilateral Titanium Screws near L4 and L5   BREAST LUMPECTOMY WITH RADIOACTIVE SEED AND SENTINEL LYMPH NODE BIOPSY Right 10/01/2019   Procedure: RIGHT BREAST LUMPECTOMY WITH RADIOACTIVE SEED AND SENTINEL LYMPH NODE BIOPSY;  Surgeon: Erroll Luna, MD;  Location: [place];  Service: General;  Laterality: Right;  PEC BLOCK   CHOLECYSTECTOMY     EXCISION / CURETTAGE BONE CYST PHALANGES OF FOOT Right    HEEL SPUR EXCISION Left    PORT-A-CATH REMOVAL Right 07/15/2020   Procedure: REMOVAL PORT-A-CATH;  Surgeon: Erroll Luna, MD;  Location: [place];  Service: General;  Laterality: Right;   PORTACATH PLACEMENT Right 11/12/2019   Procedure: INSERTION PORT-A-CATH WITH ULTRASOUND GUIDANCE, ACCESSED;  Surgeon: Erroll Luna, MD;  Location: [place];  Service: General;  Laterality: Right;   SPINAL CORD STIMULATOR INSERTION  09/26/2012   Dr Rolena Infante    SPINAL CORD STIMULATOR INSERTION N/A 09/26/2012   Procedure: SPINAL CORD STIMULATOR PLACEMENT;  Surgeon: Melina Schools, MD;  Location: [place];  Service: Orthopedics;  Laterality: N/A;   TONSILLECTOMY      There were no vitals filed for this visit.   Subjective Assessment - 03/09/21 1604     Subjective It was doing good after last time and then Saturday and then Sunday then Monday it was heavy again.  Self massage is going well.    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  other health history of HTN, anxiety, hysterectomy, 6-8 lumbar surgeries over the years including,spinal stimulator that was removed and pt reports only the leads are in place now. Has OA all over. Can't hold arms up long    Currently in Pain? No/denies                               [facility] Adult PT Treatment/Exercise - 03/09/21 0001       Manual Therapy   Manual Lymphatic Drainage (MLD) Reviewed all steps in seated in front of the mirror: with mod vcs and tcs for completion of each step.  Then PT performed in supine HOB  elevated with black yoga mat covering table crack and then pillow under low back for comfort.  bilateral axillary LN's, right inguinal LN's, anterior interaxillary pathway, right axillo-inguinal pathway, right breast directing medial brest to upper pathway and lateral breast to axillo-inguinal pathway.  Ended with LN's.                          PT Long Term Goals - 03/01/21 1722       PT LONG TERM GOAL #1   Title Pt will report decreased right breast heaviness by atleast 50%    Time 6    Period Weeks    Status New    Target Date 04/12/21      PT LONG TERM GOAL #2   Title Pt will be independent in self MLD to decrease right breast swelling    Time 6    Period Weeks    Status New    Target Date 04/12/21      PT LONG TERM GOAL #3   Title Pt will be fit for compression  bra and will note some improvement in swelling    Time 6    Period Weeks    Status New    Target Date 04/12/21                   Plan - 03/09/21 1654     Clinical Impression Statement Reviewed steps in seated in front of mirror with some difficulty still reaching over due to shoulder pain but able to make it work well.  Modified axillo inguinal a bit for reach.  Fibrosis noted inferior and mid breast and general edema overall.    PT Frequency 2x / week    PT Duration 6 weeks    PT Treatment/Interventions Taping;Vasopneumatic Device;Manual techniques;Manual lymph drainage;Patient/family education;Therapeutic exercise;Scar mobilization    PT Next Visit Plan continue MLD and pt instruction Rt breast    Consulted and Agree with Plan of Care Patient             Patient will benefit from skilled therapeutic intervention in order to improve the following deficits and impairments:     Visit Diagnosis: Malignant neoplasm of upper-outer quadrant of right breast in female, estrogen receptor positive ([place])  Localized edema  Breast swelling  Abnormal posture     Problem List Patient Active Problem List   Diagnosis Date Noted   Port-A-Cath in place 11/21/2019   Malignant neoplasm of upper-outer quadrant of right breast in female, estrogen receptor positive ([place]) 09/17/2019   AKI (acute kidney injury) ([place]) 08/03/2014   Nausea with vomiting 08/03/2014   Dehydration 08/03/2014   Bilateral flank pain 05/09/2012   HTN (hypertension) 05/09/2012   GERD (gastroesophageal reflux disease) 05/09/2012   Adjustment disorder with mixed anxiety and depressed mood 05/09/2012    Stark Bray, PT 03/09/2021, 4:57 PM  [facility] @ [place], [place], [address] Phone: [phone]   Fax:  [phone]  Name: [name] MRN: [id number] Date of Birth: 07-May-1950

## 2021-03-11 ENCOUNTER — Other Ambulatory Visit: Payer: Self-pay

## 2021-03-11 ENCOUNTER — Ambulatory Visit: Payer: Medicare HMO

## 2021-03-11 DIAGNOSIS — R600 Localized edema: Secondary | ICD-10-CM

## 2021-03-11 DIAGNOSIS — R293 Abnormal posture: Secondary | ICD-10-CM

## 2021-03-11 DIAGNOSIS — C50411 Malignant neoplasm of upper-outer quadrant of right female breast: Secondary | ICD-10-CM | POA: Diagnosis not present

## 2021-03-11 DIAGNOSIS — Z170 Estrogen receptor positive status [ER+]: Secondary | ICD-10-CM

## 2021-03-11 DIAGNOSIS — N63 Unspecified lump in unspecified breast: Secondary | ICD-10-CM

## 2021-03-11 NOTE — Therapy (Signed)
[facility] @ [place], [place], [address] Phone: [phone]   Fax:  [phone]  Physical Therapy Treatment  Patient Details  Name: [name] MRN: [id number] Date of Birth: 12/09/1950 Referring Provider (PT): Dr. Lindi Adie   Encounter Date: 03/11/2021   PT End of Session - 03/11/21 1514     Visit Number 4    Number of Visits 12    Date for PT Re-Evaluation 04/12/21    Authorization Type [employer]    Authorization - Visit Number 12    PT Start Time 5093    PT Stop Time 2671    PT Time Calculation (min) 46 min    Activity Tolerance Patient tolerated treatment well    Behavior During Therapy [facility] for tasks assessed/performed             Past Medical History:  Diagnosis Date   Anxiety    Arthritis    Cancer ([place]) 09/2019   right breast IDC   Depression    GERD (gastroesophageal reflux disease)    Hernia    umbilical   Hyperlipidemia    Hypertension    Snores     Past Surgical History:  Procedure Laterality Date   ABDOMINAL HYSTERECTOMY     Partial   BACK SURGERY     4 Bilateral Titanium Screws near L4 and L5   BREAST LUMPECTOMY WITH RADIOACTIVE SEED AND SENTINEL LYMPH NODE BIOPSY Right 10/01/2019   Procedure: RIGHT BREAST LUMPECTOMY WITH RADIOACTIVE SEED AND SENTINEL LYMPH NODE BIOPSY;  Surgeon: Erroll Luna, MD;  Location: [place];  Service: General;  Laterality: Right;  PEC BLOCK   CHOLECYSTECTOMY     EXCISION / CURETTAGE BONE CYST PHALANGES OF FOOT Right    HEEL SPUR EXCISION Left    PORT-A-CATH REMOVAL Right 07/15/2020   Procedure: REMOVAL PORT-A-CATH;  Surgeon: Erroll Luna, MD;  Location: [place];  Service: General;  Laterality: Right;   PORTACATH PLACEMENT Right 11/12/2019   Procedure: INSERTION PORT-A-CATH WITH ULTRASOUND GUIDANCE, ACCESSED;  Surgeon: Erroll Luna, MD;  Location: [place];  Service: General;  Laterality:  Right;   SPINAL CORD STIMULATOR INSERTION  09/26/2012   Dr Rolena Infante   SPINAL CORD STIMULATOR INSERTION N/A 09/26/2012   Procedure: SPINAL CORD STIMULATOR PLACEMENT;  Surgeon: Melina Schools, MD;  Location: [place];  Service: Orthopedics;  Laterality: N/A;   TONSILLECTOMY      There were no vitals filed for this visit.   Subjective Assessment - 03/11/21 1506     Subjective I have my bra appt tomorrow. I have tried to do the MLD but I am not sure I am doing it right. I felt good after I saw Marcene Brawn and it still feels less swollen today.    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  other health history of HTN, anxiety, hysterectomy, 6-8 lumbar surgeries over the years including,spinal stimulator that was removed and pt reports only the leads are in place now. Has OA all over. Can't hold arms up long    Limitations Walking    Patient Stated Goals Decrease breast swelling/ heaviness    Currently in Pain? No/denies    Pain Score 9     Pain Location Back    Pain Orientation Left    Pain Descriptors / Indicators Aching;Constant    Pain Type Chronic pain  Pain Onset More than a month ago    Pain Frequency Constant    Multiple Pain Sites No                                             PT Long Term Goals - 03/01/21 1722       PT LONG TERM GOAL #1   Title Pt will report decreased right breast heaviness by atleast 50%    Time 6    Period Weeks    Status New    Target Date 04/12/21      PT LONG TERM GOAL #2   Title Pt will be independent in self MLD to decrease right breast swelling    Time 6    Period Weeks    Status New    Target Date 04/12/21      PT LONG TERM GOAL #3   Title Pt will be fit for compression bra and will note some improvement in swelling    Time 6    Period Weeks    Status New    Target Date 04/12/21                   Plan -  03/11/21 1556     Clinical Impression Statement Continued MLD by therapist with pt in supine with head of table elevated and foam roll/pillow on table.   Pt felt no heaviness at completion of MLD. Fibrosis still noted slightly at inferior breast, but generalized edema otherwise. Pt to get fit for compression bra tomorrow    Personal Factors and Comorbidities Age    Examination-Activity Limitations Stand;Locomotion Level;Bend;Carry    Stability/Clinical Decision Making Stable/Uncomplicated    Rehab Potential Excellent    PT Frequency 2x / week    PT Duration 6 weeks    PT Treatment/Interventions Taping;Vasopneumatic Device;Manual techniques;Manual lymph drainage;Patient/family education;Therapeutic exercise;Scar mobilization    PT Next Visit Plan continue MLD and pt instruction Rt breast, did pt get compression bra    Consulted and Agree with Plan of Care Patient    Family Member Consulted daughter Roselyn Reef (needs 4:00 appts             Patient will benefit from skilled therapeutic intervention in order to improve the following deficits and impairments:  Postural dysfunction, Increased edema, Decreased knowledge of precautions, Pain  Visit Diagnosis: Malignant neoplasm of upper-outer quadrant of right breast in female, estrogen receptor positive ([place])  Localized edema  Breast swelling  Abnormal posture     Problem List Patient Active Problem List   Diagnosis Date Noted   Port-A-Cath in place 11/21/2019   Malignant neoplasm of upper-outer quadrant of right breast in female, estrogen receptor positive ([place]) 09/17/2019   AKI (acute kidney injury) ([place]) 08/03/2014   Nausea with vomiting 08/03/2014   Dehydration 08/03/2014   Bilateral flank pain 05/09/2012   HTN (hypertension) 05/09/2012   GERD (gastroesophageal reflux disease) 05/09/2012   Adjustment disorder with mixed anxiety and depressed mood 05/09/2012    Claris Pong, PT 03/11/2021, 4:00 PM  [facility] @ [place], [place], [address] Phone: [phone]   Fax:  [phone]  Name: [name] MRN: [id number] Date of Birth: [birth year]

## 2021-03-16 ENCOUNTER — Other Ambulatory Visit: Payer: Self-pay

## 2021-03-16 ENCOUNTER — Ambulatory Visit: Payer: Medicare HMO

## 2021-03-16 DIAGNOSIS — R600 Localized edema: Secondary | ICD-10-CM

## 2021-03-16 DIAGNOSIS — R293 Abnormal posture: Secondary | ICD-10-CM

## 2021-03-16 DIAGNOSIS — C50411 Malignant neoplasm of upper-outer quadrant of right female breast: Secondary | ICD-10-CM

## 2021-03-16 DIAGNOSIS — Z170 Estrogen receptor positive status [ER+]: Secondary | ICD-10-CM

## 2021-03-16 DIAGNOSIS — N63 Unspecified lump in unspecified breast: Secondary | ICD-10-CM

## 2021-03-16 NOTE — Therapy (Signed)
[facility] @ [place], [place], [address] Phone: [phone]   Fax:  [phone]  Physical Therapy Treatment  Patient Details  Name: JUSTYCE YEATER MRN: [id number] Date of Birth: 1950/04/26 Referring Provider (PT): Dr. Lindi Adie   Encounter Date: 03/16/2021   PT End of Session - 03/16/21 1707     Visit Number 5    Number of Visits 12    Date for PT Re-Evaluation 04/12/21    Authorization Type [employer]    Authorization - Visit Number 12    PT Start Time 2248    PT Stop Time 2500    PT Time Calculation (min) 50 min    Activity Tolerance Patient tolerated treatment well    Behavior During Therapy [facility] for tasks assessed/performed             Past Medical History:  Diagnosis Date   Anxiety    Arthritis    Cancer ([place]) 09/2019   right breast IDC   Depression    GERD (gastroesophageal reflux disease)    Hernia    umbilical   Hyperlipidemia    Hypertension    Snores     Past Surgical History:  Procedure Laterality Date   ABDOMINAL HYSTERECTOMY     Partial   BACK SURGERY     4 Bilateral Titanium Screws near L4 and L5   BREAST LUMPECTOMY WITH RADIOACTIVE SEED AND SENTINEL LYMPH NODE BIOPSY Right 10/01/2019   Procedure: RIGHT BREAST LUMPECTOMY WITH RADIOACTIVE SEED AND SENTINEL LYMPH NODE BIOPSY;  Surgeon: Erroll Luna, MD;  Location: [place];  Service: General;  Laterality: Right;  PEC BLOCK   CHOLECYSTECTOMY     EXCISION / CURETTAGE BONE CYST PHALANGES OF FOOT Right    HEEL SPUR EXCISION Left    PORT-A-CATH REMOVAL Right 07/15/2020   Procedure: REMOVAL PORT-A-CATH;  Surgeon: Erroll Luna, MD;  Location: [facility] [place];  Service: General;  Laterality: Right;   PORTACATH PLACEMENT Right 11/12/2019   Procedure: INSERTION PORT-A-CATH WITH ULTRASOUND GUIDANCE, ACCESSED;  Surgeon: Erroll Luna, MD;  Location: [place];  Service: General;  Laterality:  Right;   SPINAL CORD STIMULATOR INSERTION  09/26/2012   Dr Rolena Infante   SPINAL CORD STIMULATOR INSERTION N/A 09/26/2012   Procedure: SPINAL CORD STIMULATOR PLACEMENT;  Surgeon: Melina Schools, MD;  Location: [facility] [place];  Service: Orthopedics;  Laterality: N/A;   TONSILLECTOMY      There were no vitals filed for this visit.   Subjective Assessment - 03/16/21 1605     Subjective I got the bra on Friday.   I fell asleep in it and it really helped my swelling. I wore a different bra today. They are all vry comfortable.  I got three of them.    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  other health history of HTN, anxiety, hysterectomy, 6-8 lumbar surgeries over the years including,spinal stimulator that was removed and pt reports only the leads are in place now. Has OA all over. Can't hold arms up long    Limitations Walking    Patient Stated Goals Decrease breast swelling/ heaviness    Currently in Pain? Yes    Pain Score 9     Pain Location Back    Pain Orientation Left    Pain Descriptors / Indicators Aching;Constant    Pain Type Chronic pain  Pain Onset More than a month ago    Pain Frequency Constant    Multiple Pain Sites No                                             PT Long Term Goals - 03/01/21 1722       PT LONG TERM GOAL #1   Title Pt will report decreased right breast heaviness by atleast 50%    Time 6    Period Weeks    Status New    Target Date 04/12/21      PT LONG TERM GOAL #2   Title Pt will be independent in self MLD to decrease right breast swelling    Time 6    Period Weeks    Status New    Target Date 04/12/21      PT LONG TERM GOAL #3   Title Pt will be fit for compression bra and will note some improvement in swelling    Time 6    Period Weeks    Status New    Target Date 04/12/21                   Plan - 03/16/21  1708     Clinical Impression Statement Continued right breast MLD and reviewed all techniques with pt.  Her technique was greatly improved after practice and she used good stretch after cueing. She would occasionally work the pathway in the proper direction and then start going back in the opposite direction.  Advised pt she should always be working in the direction of the pathway and LN's that are drainage site.  Pt improved a great deal.  Pt advised to wear her true compression bra next visit so I may assess the swelling at her breast,    Personal Factors and Comorbidities Comorbidity 2    Comorbidities Right breast Cancer post radiation, chronic LBP    Examination-Activity Limitations Stand;Locomotion Level;Bend;Carry    Stability/Clinical Decision Making Stable/Uncomplicated    Rehab Potential Excellent    PT Frequency 2x / week    PT Duration 6 weeks    PT Treatment/Interventions Taping;Vasopneumatic Device;Manual techniques;Manual lymph drainage;Patient/family education;Therapeutic exercise;Scar mobilization    PT Next Visit Plan check compression bra and breast next, continue to review MLD    PT Home Exercise Plan MLD to right breast    Consulted and Agree with Plan of Care Patient             Patient will benefit from skilled therapeutic intervention in order to improve the following deficits and impairments:  Postural dysfunction, Increased edema, Decreased knowledge of precautions, Pain  Visit Diagnosis: Malignant neoplasm of upper-outer quadrant of right breast in female, estrogen receptor positive ([place])  Localized edema  Breast swelling  Abnormal posture     Problem List Patient Active Problem List   Diagnosis Date Noted   Port-A-Cath in place 11/21/2019   Malignant neoplasm of upper-outer quadrant of right breast in female, estrogen receptor positive ([place]) 09/17/2019   AKI (acute kidney injury) ([place]) 08/03/2014   Nausea with vomiting 08/03/2014   Dehydration  08/03/2014   Bilateral flank pain 05/09/2012   HTN (hypertension) 05/09/2012   GERD (gastroesophageal reflux disease) 05/09/2012   Adjustment disorder with mixed anxiety and depressed mood 05/09/2012    Claris Pong, PT 03/16/2021, 5:15 PM  [facility]  [facility] @ [place], [place], [address] Phone: [phone]   Fax:  [phone]  Name: DAMARI HILTZ MRN: [id number] Date of Birth: 19-May-1950

## 2021-03-22 ENCOUNTER — Ambulatory Visit: Payer: Medicare HMO | Attending: Hematology and Oncology | Admitting: Physical Therapy

## 2021-03-22 ENCOUNTER — Other Ambulatory Visit: Payer: Self-pay

## 2021-03-22 ENCOUNTER — Encounter: Payer: Self-pay | Admitting: Physical Therapy

## 2021-03-22 DIAGNOSIS — C50411 Malignant neoplasm of upper-outer quadrant of right female breast: Secondary | ICD-10-CM | POA: Insufficient documentation

## 2021-03-22 DIAGNOSIS — N63 Unspecified lump in unspecified breast: Secondary | ICD-10-CM | POA: Insufficient documentation

## 2021-03-22 DIAGNOSIS — Z170 Estrogen receptor positive status [ER+]: Secondary | ICD-10-CM | POA: Insufficient documentation

## 2021-03-22 DIAGNOSIS — R293 Abnormal posture: Secondary | ICD-10-CM | POA: Insufficient documentation

## 2021-03-22 DIAGNOSIS — R600 Localized edema: Secondary | ICD-10-CM | POA: Insufficient documentation

## 2021-03-22 NOTE — Therapy (Signed)
[facility] @ [place], [place], [address] Phone: [phone]   Fax:  [phone]  Physical Therapy Treatment  Patient Details  Name: [name] MRN: [id number] Date of Birth: January 06, 1951 Referring Provider (PT): Dr. Lindi Adie   Encounter Date: 03/22/2021   PT End of Session - 03/22/21 1706     Visit Number 7    Number of Visits 12    Date for PT Re-Evaluation 04/12/21    Authorization Type [employer]    Authorization - Visit Number 12    PT Start Time 8325    PT Stop Time 1700    PT Time Calculation (min) 50 min    Activity Tolerance Patient tolerated treatment well    Behavior During Therapy [facility] for tasks assessed/performed             Past Medical History:  Diagnosis Date   Anxiety    Arthritis    Cancer ([place]) 09/2019   right breast IDC   Depression    GERD (gastroesophageal reflux disease)    Hernia    umbilical   Hyperlipidemia    Hypertension    Snores     Past Surgical History:  Procedure Laterality Date   ABDOMINAL HYSTERECTOMY     Partial   BACK SURGERY     4 Bilateral Titanium Screws near L4 and L5   BREAST LUMPECTOMY WITH RADIOACTIVE SEED AND SENTINEL LYMPH NODE BIOPSY Right 10/01/2019   Procedure: RIGHT BREAST LUMPECTOMY WITH RADIOACTIVE SEED AND SENTINEL LYMPH NODE BIOPSY;  Surgeon: Erroll Luna, MD;  Location: [place];  Service: General;  Laterality: Right;  PEC BLOCK   CHOLECYSTECTOMY     EXCISION / CURETTAGE BONE CYST PHALANGES OF FOOT Right    HEEL SPUR EXCISION Left    PORT-A-CATH REMOVAL Right 07/15/2020   Procedure: REMOVAL PORT-A-CATH;  Surgeon: Erroll Luna, MD;  Location: [facility];  Service: General;  Laterality: Right;   PORTACATH PLACEMENT Right 11/12/2019   Procedure: INSERTION PORT-A-CATH WITH ULTRASOUND GUIDANCE, ACCESSED;  Surgeon: Erroll Luna, MD;  Location: [facility];  Service: General;  Laterality:  Right;   SPINAL CORD STIMULATOR INSERTION  09/26/2012   Dr Rolena Infante   SPINAL CORD STIMULATOR INSERTION N/A 09/26/2012   Procedure: SPINAL CORD STIMULATOR PLACEMENT;  Surgeon: Melina Schools, MD;  Location: [facility];  Service: Orthopedics;  Laterality: N/A;   TONSILLECTOMY      There were no vitals filed for this visit.   Subjective Assessment - 03/22/21 1617     Subjective Pt comes in with her compression bras. She feels that it "makes her feel normal again.  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  Radiation completed 04/28/2020   other health history of HTN, anxiety, hysterectomy, 6-8 lumbar surgeries over the years including,spinal stimulator that was removed and pt reports only the leads are in place now. Has OA all over. Can't hold arms up long    Patient Stated Goals Decrease breast swelling/ heaviness    Currently in Pain? Yes    Pain Score --   did not rate   Pain Location Back    Pain Orientation Medial    Pain Descriptors / Indicators Aching    Pain Type Chronic pain    Pain  Radiating Towards down legs    Pain Onset More than a month ago    Pain Frequency Constant                               [facility] Adult PT Treatment/Exercise - 03/22/21 0001       Manual Therapy   Manual Therapy Manual Lymphatic Drainage (MLD)    Manual therapy comments Pt brought in 3 compression bras.  The Geske one did not work well for her, there was too much gapping at the axilla.  the black one fit the best and the prarie bra offerred the best compression but did not come up high enough at the axilla. She was given information to get and extender which would allow the bra to cover higher at the axilla. Pt will talk to her daughter about ti.    Manual Lymphatic Drainage (MLD) Pt in supine with head of table slightly  elevated; MLD to right breast by PT short neck, 5 diaphragmatic breaths, bilateral axillary LN's, right inguinal LN's, anterior interaxillary pathway, right axillo-inguinal pathway, right breast directing medial brest to upper pathway and lateral breast to axillo-inguinal pathway.  Ended with LN's.                          PT Long Term Goals - 03/01/21 1722       PT LONG TERM GOAL #1   Title Pt will report decreased right breast heaviness by atleast 50%    Time 6    Period Weeks    Status New    Target Date 04/12/21      PT LONG TERM GOAL #2   Title Pt will be independent in self MLD to decrease right breast swelling    Time 6    Period Weeks    Status New    Target Date 04/12/21      PT LONG TERM GOAL #3   Title Pt will be fit for compression bra and will note some improvement in swelling    Time 6    Period Weeks    Status New    Target Date 04/12/21                   Plan - 03/22/21 1707     Clinical Impression Statement Pt has compression bras and may get an extender for the praire. She is getting  Flexi trial on Wednesday and feels like she should be ready to discharge a few sessions after that.    Personal Factors and Comorbidities Comorbidity 2    Comorbidities Right breast Cancer post radiation, chronic LBP    Stability/Clinical Decision Making Stable/Uncomplicated    Rehab Potential Excellent    PT Frequency 2x / week    PT Treatment/Interventions Taping;Vasopneumatic Device;Manual techniques;Manual lymph drainage;Patient/family education;Therapeutic exercise;Scar mobilization    PT Next Visit Plan check compression bra and breast next, how did Flexi go? continue to review MLD    PT Home Exercise Plan MLD to right breast    Consulted and Agree with Plan of Care Patient             Patient will benefit from skilled therapeutic intervention in order to improve the following deficits and impairments:  Postural dysfunction, Increased  edema, Decreased knowledge of precautions, Pain  Visit Diagnosis: Malignant neoplasm of upper-outer quadrant of right breast in female, estrogen receptor positive ([place])  Localized edema  Breast swelling  Abnormal posture     Problem List Patient Active Problem List   Diagnosis Date Noted   Port-A-Cath in place 11/21/2019   Malignant neoplasm of upper-outer quadrant of right breast in female, estrogen receptor positive ([place]) 09/17/2019   AKI (acute kidney injury) ([place]) 08/03/2014   Nausea with vomiting 08/03/2014   Dehydration 08/03/2014   Bilateral flank pain 05/09/2012   HTN (hypertension) 05/09/2012   GERD (gastroesophageal reflux disease) 05/09/2012   Adjustment disorder with mixed anxiety and depressed mood 05/09/2012   Donato Heinz. Owens Shark PT  Norwood Levo, PT 03/22/2021, 5:09 PM  [facility] @ [place], [place], [address] Phone: [phone]   Fax:  [phone]  Name: MARJO GROSVENOR MRN: [id number] Date of Birth: 12-19-1950

## 2021-03-24 ENCOUNTER — Other Ambulatory Visit: Payer: Self-pay

## 2021-03-24 ENCOUNTER — Ambulatory Visit: Payer: Medicare HMO

## 2021-03-24 DIAGNOSIS — C50411 Malignant neoplasm of upper-outer quadrant of right female breast: Secondary | ICD-10-CM

## 2021-03-24 DIAGNOSIS — R293 Abnormal posture: Secondary | ICD-10-CM

## 2021-03-24 DIAGNOSIS — Z170 Estrogen receptor positive status [ER+]: Secondary | ICD-10-CM

## 2021-03-24 DIAGNOSIS — N63 Unspecified lump in unspecified breast: Secondary | ICD-10-CM

## 2021-03-24 DIAGNOSIS — R600 Localized edema: Secondary | ICD-10-CM

## 2021-03-24 NOTE — Therapy (Signed)
[facility] @ [place], [place], [address] Phone: [phone]   Fax:  [phone]  Physical Therapy Treatment  Patient Details  Name: [name] MRN: [id number] Date of Birth: 1950-04-13 Referring Provider (PT): Dr. Lindi Adie   Encounter Date: 03/24/2021   PT End of Session - 03/24/21 1614     Visit Number 8    Number of Visits 12    Date for PT Re-Evaluation 04/12/21    Authorization Type [employer]    Authorization - Visit Number 12    PT Start Time 2703    PT Stop Time 5009    PT Time Calculation (min) 49 min    Activity Tolerance Patient tolerated treatment well    Behavior During Therapy [facility] for tasks assessed/performed             Past Medical History:  Diagnosis Date   Anxiety    Arthritis    Cancer ([place]) 09/2019   right breast IDC   Depression    GERD (gastroesophageal reflux disease)    Hernia    umbilical   Hyperlipidemia    Hypertension    Snores     Past Surgical History:  Procedure Laterality Date   ABDOMINAL HYSTERECTOMY     Partial   BACK SURGERY     4 Bilateral Titanium Screws near L4 and L5   BREAST LUMPECTOMY WITH RADIOACTIVE SEED AND SENTINEL LYMPH NODE BIOPSY Right 10/01/2019   Procedure: RIGHT BREAST LUMPECTOMY WITH RADIOACTIVE SEED AND SENTINEL LYMPH NODE BIOPSY;  Surgeon: Erroll Luna, MD;  Location: [facility];  Service: General;  Laterality: Right;  PEC BLOCK   CHOLECYSTECTOMY     EXCISION / CURETTAGE BONE CYST PHALANGES OF FOOT Right    HEEL SPUR EXCISION Left    PORT-A-CATH REMOVAL Right 07/15/2020   Procedure: REMOVAL PORT-A-CATH;  Surgeon: Erroll Luna, MD;  Location: [facility];  Service: General;  Laterality: Right;   PORTACATH PLACEMENT Right 11/12/2019   Procedure: INSERTION PORT-A-CATH WITH ULTRASOUND GUIDANCE, ACCESSED;  Surgeon: Erroll Luna, MD;  Location: [facility];  Service: General;  Laterality:  Right;   SPINAL CORD STIMULATOR INSERTION  09/26/2012   Dr Rolena Infante   SPINAL CORD STIMULATOR INSERTION N/A 09/26/2012   Procedure: SPINAL CORD STIMULATOR PLACEMENT;  Surgeon: Melina Schools, MD;  Location: [facility];  Service: Orthopedics;  Laterality: N/A;   TONSILLECTOMY      There were no vitals filed for this visit.   Subjective Assessment - 03/24/21 1609     Subjective They cancelled my flexi appt because Jinny Blossom was sick.  Now she is scheduled to come next Wednesday, Helene Kelp wanted me to get an extender for my bra. We havent had time to get it. I feel like swelling is improved overall.    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  Radiation completed 04/28/2020   other health history of HTN, anxiety, hysterectomy, 6-8 lumbar surgeries over the years including,spinal stimulator that was removed and pt reports only the leads are in place now. Has OA all over. Can't hold arms up long    Limitations Walking    Patient Stated Goals Decrease breast swelling/ heaviness    Currently in Pain? No/denies    Pain Score 1     Pain Location Back  [facility] Adult PT Treatment/Exercise - 03/24/21 0001       Manual Therapy   Manual therapy comments advised pt to call about extender to determine cost    Manual Lymphatic Drainage (MLD) Pt in supine with head of table slightly elevated; MLD to right breast by PT short neck, 5 diaphragmatic breaths, bilateral axillary LN's, right inguinal LN's, anterior interaxillary pathway, right axillo-inguinal pathway, right breast directing medial breast to upper pathway and lateral breast to axillo-inguinal pathway.  Ended with LN's. Pt practiced pathways and was able to return demonstrate                          PT Long Term Goals - 03/01/21 1722       PT LONG TERM GOAL #1   Title Pt will report decreased right  breast heaviness by atleast 50%    Time 6    Period Weeks    Status New    Target Date 04/12/21      PT LONG TERM GOAL #2   Title Pt will be independent in self MLD to decrease right breast swelling    Time 6    Period Weeks    Status New    Target Date 04/12/21      PT LONG TERM GOAL #3   Title Pt will be fit for compression bra and will note some improvement in swelling    Time 6    Period Weeks    Status New    Target Date 04/12/21                   Plan - 03/24/21 1706     Clinical Impression Statement pt has not been able to get the extender for her prairie bra yet.  Her Flexi touch trial has been moved to next week because representative was sick. continued MLD and explained to pt multiple times the importance of doing all the steps and using stretch as opposed to just rubbing her breast.  Explained again the breast aspect of the MLD and how medial side of diagonal line is directed to upper pathway, and lateral breast to axillo-inguinal pathway. showed pt the breast diagram again and she seemed to understand it better. Breast is still much larger and with enlarged pores. Advised pt to call about extender to see if she can get sooner rather than later.    Personal Factors and Comorbidities Comorbidity 2    Comorbidities Right breast Cancer post radiation, chronic LBP    Examination-Activity Limitations Stand;Locomotion Level;Bend;Carry    Stability/Clinical Decision Making Stable/Uncomplicated    Rehab Potential Excellent    PT Frequency 2x / week    PT Duration 6 weeks    PT Treatment/Interventions Taping;Vasopneumatic Device;Manual techniques;Manual lymph drainage;Patient/family education;Therapeutic exercise;Scar mobilization    PT Next Visit Plan did pt get extender yet for prairie bra?, performing MLD at home with proper steps, Review with pt right breast MLD, Flexi trial moved to 03/31/21    PT Home Exercise Plan MLD to right breast    Consulted and Agree with  Plan of Care Patient             Patient will benefit from skilled therapeutic intervention in order to improve the following deficits and impairments:     Visit Diagnosis: Malignant neoplasm of upper-outer quadrant of right breast in female, estrogen receptor positive ([place])  Localized edema  Breast swelling  Abnormal posture  Problem List Patient Active Problem List   Diagnosis Date Noted   Port-A-Cath in place 11/21/2019   Malignant neoplasm of upper-outer quadrant of right breast in female, estrogen receptor positive ([place]) 09/17/2019   AKI (acute kidney injury) ([place]) 08/03/2014   Nausea with vomiting 08/03/2014   Dehydration 08/03/2014   Bilateral flank pain 05/09/2012   HTN (hypertension) 05/09/2012   GERD (gastroesophageal reflux disease) 05/09/2012   Adjustment disorder with mixed anxiety and depressed mood 05/09/2012    Claris Pong, PT 03/24/2021, 5:11 PM  [facility] @ [place], [place], [address] Phone: [phone]   Fax:  [phone]  Name: [name] MRN: [id number] Date of Birth: Feb 18, 1950

## 2021-03-29 ENCOUNTER — Ambulatory Visit: Payer: Medicare HMO

## 2021-03-29 ENCOUNTER — Other Ambulatory Visit: Payer: Self-pay

## 2021-03-29 DIAGNOSIS — C50411 Malignant neoplasm of upper-outer quadrant of right female breast: Secondary | ICD-10-CM

## 2021-03-29 DIAGNOSIS — R600 Localized edema: Secondary | ICD-10-CM

## 2021-03-29 DIAGNOSIS — Z170 Estrogen receptor positive status [ER+]: Secondary | ICD-10-CM

## 2021-03-29 DIAGNOSIS — R293 Abnormal posture: Secondary | ICD-10-CM

## 2021-03-29 DIAGNOSIS — N63 Unspecified lump in unspecified breast: Secondary | ICD-10-CM

## 2021-03-29 NOTE — Therapy (Signed)
[facility] @ [place], [place], [address] Phone: [phone]   Fax:  [phone]  Physical Therapy Treatment  Patient Details  Name: [name] MRN: [id number] Date of Birth: [birth year] Referring Provider (PT): Dr. Lindi Adie   Encounter Date: 03/29/2021   PT End of Session - 03/29/21 1655     Visit Number 9    Number of Visits 12    Date for PT Re-Evaluation 04/12/21    Authorization Type [employer]    Authorization - Visit Number 12    PT Start Time 1600    PT Stop Time 1648    PT Time Calculation (min) 48 min    Activity Tolerance Patient tolerated treatment well    Behavior During Therapy [facility] for tasks assessed/performed             Past Medical History:  Diagnosis Date   Anxiety    Arthritis    Cancer ([place]) 09/2019   right breast IDC   Depression    GERD (gastroesophageal reflux disease)    Hernia    umbilical   Hyperlipidemia    Hypertension    Snores     Past Surgical History:  Procedure Laterality Date   ABDOMINAL HYSTERECTOMY     Partial   BACK SURGERY     4 Bilateral Titanium Screws near L4 and L5   BREAST LUMPECTOMY WITH RADIOACTIVE SEED AND SENTINEL LYMPH NODE BIOPSY Right 10/01/2019   Procedure: RIGHT BREAST LUMPECTOMY WITH RADIOACTIVE SEED AND SENTINEL LYMPH NODE BIOPSY;  Surgeon: Erroll Luna, MD;  Location: [facility];  Service: General;  Laterality: Right;  PEC BLOCK   CHOLECYSTECTOMY     EXCISION / CURETTAGE BONE CYST PHALANGES OF FOOT Right    HEEL SPUR EXCISION Left    PORT-A-CATH REMOVAL Right 07/15/2020   Procedure: REMOVAL PORT-A-CATH;  Surgeon: Erroll Luna, MD;  Location: [facility];  Service: General;  Laterality: Right;   PORTACATH PLACEMENT Right 11/12/2019   Procedure: INSERTION PORT-A-CATH WITH ULTRASOUND GUIDANCE, ACCESSED;  Surgeon: Erroll Luna, MD;  Location: [facility];  Service: General;  Laterality:  Right;   SPINAL CORD STIMULATOR INSERTION  09/26/2012   Dr Rolena Infante   SPINAL CORD STIMULATOR INSERTION N/A 09/26/2012   Procedure: SPINAL CORD STIMULATOR PLACEMENT;  Surgeon: Melina Schools, MD;  Location: [facility];  Service: Orthopedics;  Laterality: N/A;   TONSILLECTOMY      There were no vitals filed for this visit.   Subjective Assessment - 03/29/21 1601     Subjective I wore the compression bra on Saturday and I wore it overnight but when I took it off in the am it seemed more swollen medially. Scheduled for Flexitouch on Wednesday at pts house. Pt reports practicing all the steps now. She has not called about the bra extender yet, but she was able to get on her compression bra earlier today without any trouble. It feels good on her back.    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  Radiation completed 04/28/2020   other health history of HTN, anxiety, hysterectomy, 6-8 lumbar surgeries over the years including,spinal stimulator that was removed and pt reports only the leads are in place now. Has OA all over. Can't hold arms up long    Limitations Walking    Currently in Pain? Yes    Pain Score 9  Pain Location Back    Pain Orientation Left;Right    Pain Descriptors / Indicators Aching;Shooting;Sharp    Pain Type Chronic pain    Pain Onset More than a month ago    Pain Frequency Constant    Aggravating Factors  Moving around    Pain Relieving Factors nothing helps    Effect of Pain on Daily Activities limits activities    Multiple Pain Sites No                               [facility] Adult PT Treatment/Exercise - 03/29/21 0001       Manual Therapy   Manual therapy comments advised pt to call about extender to determine cost    Manual Lymphatic Drainage (MLD) Pt in supine with head of table slightly elevated; MLD to right breast by PT short neck, 5 diaphragmatic breaths,  bilateral axillary LN's, right inguinal LN's, anterior interaxillary pathway, right axillo-inguinal pathway, right breast directing medial breast to upper pathway and lateral breast to axillo-inguinal pathway.  Ended with LN's. Pt practiced LN activation, pathways, and breast technique and was able to return demonstrate. Therapist did the majority of the MLD.                          PT Long Term Goals - 03/01/21 1722       PT LONG TERM GOAL #1   Title Pt will report decreased right breast heaviness by atleast 50%    Time 6    Period Weeks    Status New    Target Date 04/12/21      PT LONG TERM GOAL #2   Title Pt will be independent in self MLD to decrease right breast swelling    Time 6    Period Weeks    Status New    Target Date 04/12/21      PT LONG TERM GOAL #3   Title Pt will be fit for compression bra and will note some improvement in swelling    Time 6    Period Weeks    Status New    Target Date 04/12/21                   Plan - 03/29/21 1657     Clinical Impression Statement Pt has not called about the extender for her bra yet but was encouraged again to do this.  We reviewed MLD to the right breast again with therapist initiating all techniques and pt return demonstrating.  She has a much better understanding of the sequence and she is using good stretch now instead of just rubbing her breast.  She is using her handout to be sure and practice all techniques.  She was somewhat limited with right shoulder today secondary to pain but did well using left UE for pathways. She has her flexitouch trial on Wednesday at home.    Personal Factors and Comorbidities Comorbidity 2    Comorbidities Right breast Cancer post radiation, chronic LBP    Examination-Activity Limitations Stand;Locomotion Level;Bend;Carry    Stability/Clinical Decision Making Stable/Uncomplicated    Rehab Potential Excellent    PT Frequency 2x / week    PT Duration 6 weeks    PT  Treatment/Interventions Taping;Vasopneumatic Device;Manual techniques;Manual lymph drainage;Patient/family education;Therapeutic exercise;Scar mobilization    PT Next Visit Plan did dpt get extender yet for prairie bra?, performing MLD at  home with proper steps, Review with pt right breast MLD, Flexi trial moved to 03/31/21    PT Home Exercise Plan MLD to right breast    Consulted and Agree with Plan of Care Patient             Patient will benefit from skilled therapeutic intervention in order to improve the following deficits and impairments:  Postural dysfunction, Increased edema, Decreased knowledge of precautions, Pain  Visit Diagnosis: Malignant neoplasm of upper-outer quadrant of right breast in female, estrogen receptor positive ([place])  Localized edema  Breast swelling  Abnormal posture     Problem List Patient Active Problem List   Diagnosis Date Noted   Port-A-Cath in place 11/21/2019   Malignant neoplasm of upper-outer quadrant of right breast in female, estrogen receptor positive ([place]) 09/17/2019   AKI (acute kidney injury) ([place]) 08/03/2014   Nausea with vomiting 08/03/2014   Dehydration 08/03/2014   Bilateral flank pain 05/09/2012   HTN (hypertension) 05/09/2012   GERD (gastroesophageal reflux disease) 05/09/2012   Adjustment disorder with mixed anxiety and depressed mood 05/09/2012    Claris Pong, PT 03/29/2021, 5:01 PM  [facility] @ [place], [place], [address] Phone: [phone]   Fax:  [phone]  Name: KAILEIGH VISWANATHAN MRN: [id number] Date of Birth: January 13, 1951

## 2021-03-31 ENCOUNTER — Encounter: Payer: Self-pay | Admitting: Rehabilitation

## 2021-03-31 ENCOUNTER — Other Ambulatory Visit: Payer: Self-pay

## 2021-03-31 ENCOUNTER — Ambulatory Visit: Payer: Medicare HMO | Admitting: Rehabilitation

## 2021-03-31 DIAGNOSIS — N63 Unspecified lump in unspecified breast: Secondary | ICD-10-CM

## 2021-03-31 DIAGNOSIS — R293 Abnormal posture: Secondary | ICD-10-CM

## 2021-03-31 DIAGNOSIS — R600 Localized edema: Secondary | ICD-10-CM

## 2021-03-31 DIAGNOSIS — C50411 Malignant neoplasm of upper-outer quadrant of right female breast: Secondary | ICD-10-CM

## 2021-03-31 NOTE — Therapy (Signed)
[facility] @ [address] [place], [place], [address] Phone: [phone]   Fax:  [phone]  Physical Therapy Treatment  Patient Details  Name: [name] MRN: [id number] Date of Birth: 10-27-1950 Referring Provider (PT): Dr. Lindi Adie   Encounter Date: 03/31/2021    Past Medical History:  Diagnosis Date   Anxiety    Arthritis    Cancer ([place]) 09/2019   right breast IDC   Depression    GERD (gastroesophageal reflux disease)    Hernia    umbilical   Hyperlipidemia    Hypertension    Snores     Past Surgical History:  Procedure Laterality Date   ABDOMINAL HYSTERECTOMY     Partial   BACK SURGERY     4 Bilateral Titanium Screws near L4 and L5   BREAST LUMPECTOMY WITH RADIOACTIVE SEED AND SENTINEL LYMPH NODE BIOPSY Right 10/01/2019   Procedure: RIGHT BREAST LUMPECTOMY WITH RADIOACTIVE SEED AND SENTINEL LYMPH NODE BIOPSY;  Surgeon: Erroll Luna, MD;  Location: [facility];  Service: General;  Laterality: Right;  PEC BLOCK   CHOLECYSTECTOMY     EXCISION / CURETTAGE BONE CYST PHALANGES OF FOOT Right    HEEL SPUR EXCISION Left    PORT-A-CATH REMOVAL Right 07/15/2020   Procedure: REMOVAL PORT-A-CATH;  Surgeon: Erroll Luna, MD;  Location: [facility];  Service: General;  Laterality: Right;   PORTACATH PLACEMENT Right 11/12/2019   Procedure: INSERTION PORT-A-CATH WITH ULTRASOUND GUIDANCE, ACCESSED;  Surgeon: Erroll Luna, MD;  Location: [facility];  Service: General;  Laterality: Right;   SPINAL CORD STIMULATOR INSERTION  09/26/2012   Dr Rolena Infante   SPINAL CORD STIMULATOR INSERTION N/A 09/26/2012   Procedure: SPINAL CORD STIMULATOR PLACEMENT;  Surgeon: Melina Schools, MD;  Location: [facility];  Service: Orthopedics;  Laterality: N/A;   TONSILLECTOMY      There were no vitals filed for this visit.   Subjective Assessment - 03/31/21 1618     Subjective I liked the flexitouch.  We  kept in on for 45mn, but then the breast was kind of red but it felt so good. 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  Radiation completed 04/28/2020   other health history of HTN, anxiety, hysterectomy, 6-8 lumbar surgeries over the years including,spinal stimulator that was removed and pt reports only the leads are in place now. Has OA all over. Can't hold arms up long    Currently in Pain? Yes    Pain Score 9     Pain Location Back    Pain Orientation Left;Right    Pain Descriptors / Indicators Aching    Pain Type Chronic pain    Pain Onset More than a month ago    Pain Frequency Constant                               [facility] Adult PT Treatment/Exercise - 03/31/21 0001       Manual Therapy   Manual therapy comments --    Manual Lymphatic Drainage (MLD) Pt in supine with head of table slightly elevated; MLD to right breast by PT short neck, 5 diaphragmatic breaths, bilateral axillary LN's, right inguinal LN's, anterior interaxillary pathway, right axillo-inguinal pathway, right breast directing medial breast to upper pathway and lateral  breast to axillo-inguinal pathway.  Ended with LN's.                          PT Long Term Goals - 03/01/21 1722       PT LONG TERM GOAL #1   Title Pt will report decreased right breast heaviness by atleast 50%    Time 6    Period Weeks    Status New    Target Date 04/12/21      PT LONG TERM GOAL #2   Title Pt will be independent in self MLD to decrease right breast swelling    Time 6    Period Weeks    Status New    Target Date 04/12/21      PT LONG TERM GOAL #3   Title Pt will be fit for compression bra and will note some improvement in swelling    Time 6    Period Weeks    Status New    Target Date 04/12/21                   Plan - 03/31/21 1659     Clinical Impression  Statement Pt had her flexitouch trial today and really likes it and is hopeful to get one with some financial help.  Continued with MLD to the Rt breast.    PT Frequency 2x / week    PT Duration 6 weeks    PT Treatment/Interventions Taping;Vasopneumatic Device;Manual techniques;Manual lymph drainage;Patient/family education;Therapeutic exercise;Scar mobilization    PT Next Visit Plan performing MLD at home with proper steps, Review with pt right breast MLD,    Consulted and Agree with Plan of Care Patient             Patient will benefit from skilled therapeutic intervention in order to improve the following deficits and impairments:     Visit Diagnosis: Malignant neoplasm of upper-outer quadrant of right breast in female, estrogen receptor positive ([place])  Localized edema  Breast swelling  Abnormal posture     Problem List Patient Active Problem List   Diagnosis Date Noted   Port-A-Cath in place 11/21/2019   Malignant neoplasm of upper-outer quadrant of right breast in female, estrogen receptor positive ([place]) 09/17/2019   AKI (acute kidney injury) ([place]) 08/03/2014   Nausea with vomiting 08/03/2014   Dehydration 08/03/2014   Bilateral flank pain 05/09/2012   HTN (hypertension) 05/09/2012   GERD (gastroesophageal reflux disease) 05/09/2012   Adjustment disorder with mixed anxiety and depressed mood 05/09/2012    Stark Bray, PT 03/31/2021, 5:06 PM  [facility] @ [place], [place], [address] Phone: [phone]   Fax:  [phone]  Name: [name] MRN: [id number] Date of Birth: Jan 03, 1951

## 2021-04-05 ENCOUNTER — Ambulatory Visit: Payer: Medicare HMO

## 2021-04-05 ENCOUNTER — Other Ambulatory Visit: Payer: Self-pay

## 2021-04-05 DIAGNOSIS — Z170 Estrogen receptor positive status [ER+]: Secondary | ICD-10-CM

## 2021-04-05 DIAGNOSIS — C50411 Malignant neoplasm of upper-outer quadrant of right female breast: Secondary | ICD-10-CM | POA: Diagnosis not present

## 2021-04-05 DIAGNOSIS — N63 Unspecified lump in unspecified breast: Secondary | ICD-10-CM

## 2021-04-05 DIAGNOSIS — R600 Localized edema: Secondary | ICD-10-CM

## 2021-04-05 DIAGNOSIS — R293 Abnormal posture: Secondary | ICD-10-CM

## 2021-04-05 NOTE — Therapy (Signed)
[name], [name], [name] Phone: [phone]   Fax:  [phone]  Physical Therapy Treatment  Patient Details  Name: [name] MRN: [id number] Date of Birth: [birth year] Referring Provider (PT): Dr. Lindi Adie   Encounter Date: 04/05/2021   PT End of Session - 04/05/21 1559     Visit Number 11    Number of Visits 12    Date for PT Re-Evaluation 04/12/21    Authorization Type [employer]    Authorization - Visit Number 12    PT Start Time 1600    PT Stop Time 4315    PT Time Calculation (min) 50 min    Activity Tolerance Patient tolerated treatment well    Behavior During Therapy [facility] for tasks assessed/performed             Past Medical History:  Diagnosis Date   Anxiety    Arthritis    Cancer ([place]) 09/2019   right breast IDC   Depression    GERD (gastroesophageal reflux disease)    Hernia    umbilical   Hyperlipidemia    Hypertension    Snores     Past Surgical History:  Procedure Laterality Date   ABDOMINAL HYSTERECTOMY     Partial   BACK SURGERY     4 Bilateral Titanium Screws near L4 and L5   BREAST LUMPECTOMY WITH RADIOACTIVE SEED AND SENTINEL LYMPH NODE BIOPSY Right 10/01/2019   Procedure: RIGHT BREAST LUMPECTOMY WITH RADIOACTIVE SEED AND SENTINEL LYMPH NODE BIOPSY;  Surgeon: Erroll Luna, MD;  Location: [facility];  Service: General;  Laterality: Right;  PEC BLOCK   CHOLECYSTECTOMY     EXCISION / CURETTAGE BONE CYST PHALANGES OF FOOT Right    HEEL SPUR EXCISION Left    PORT-A-CATH REMOVAL Right 07/15/2020   Procedure: REMOVAL PORT-A-CATH;  Surgeon: Erroll Luna, MD;  Location: [facility];  Service: General;  Laterality: Right;   PORTACATH PLACEMENT Right 11/12/2019   Procedure: INSERTION PORT-A-CATH WITH ULTRASOUND GUIDANCE, ACCESSED;  Surgeon: Erroll Luna, MD;  Location: [facility];  Service: General;   Laterality: Right;   SPINAL CORD STIMULATOR INSERTION  09/26/2012   Dr Rolena Infante   SPINAL CORD STIMULATOR INSERTION N/A 09/26/2012   Procedure: SPINAL CORD STIMULATOR PLACEMENT;  Surgeon: Melina Schools, MD;  Location: [facility];  Service: Orthopedics;  Laterality: N/A;   TONSILLECTOMY      There were no vitals filed for this visit.   Subjective Assessment - 04/05/21 1559     Subjective I slept in the compression bra last night and after massaging today it didn't feel hard and it wasn't sore laterally. I can get the bra on now without the extender but I am not sure why.. I have been using the frog vibrator on my breast in addition to the MLD and it seems to have  helped.    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  Radiation completed 04/28/2020   other health history of HTN, anxiety, hysterectomy, 6-8 lumbar surgeries over the years including,spinal stimulator that was removed and pt reports only the leads are in place now. Has OA all over. Can't hold arms up long    Limitations Walking    Patient Stated Goals Decrease breast swelling/ heaviness    Currently in Pain? Yes    Pain Score 9  Pain Location Back    Pain Orientation Right;Left    Pain Type Chronic pain    Pain Onset More than a month ago                   LYMPHEDEMA/ONCOLOGY QUESTIONNAIRE - 04/05/21 0001       Right Upper Extremity Lymphedema   10 cm Proximal to Olecranon Process 31.5 cm    Olecranon Process 25.3 cm    10 cm Proximal to Ulnar Styloid Process 22.7 cm    Just Proximal to Ulnar Styloid Process 16.6 cm    Across Hand at [employer] 19.9 cm    At [place] of 2nd Digit 6.3 cm    Other bust 130 cm      Left Upper Extremity Lymphedema   10 cm Proximal to Olecranon Process 30.51 cm    Olecranon Process 25.7 cm    10 cm Proximal to Ulnar Styloid Process 24.1 cm    At Base of 2nd Digit 6.3 cm                         [facility] Adult PT Treatment/Exercise - 04/05/21 0001       Manual Therapy   Manual therapy comments measured bilateral UE's, bust    Manual Lymphatic Drainage (MLD) Pt in supine with head of table slightly elevated; MLD to right breast by PT short neck, 5 diaphragmatic breaths, bilateral axillary LN's, right inguinal LN's, anterior interaxillary pathway, right axillo-inguinal pathway, right breast directing medial breast to upper pathway and lateral breast to axillo-inguinal pathway.  Ended with LN's. PT performed and then had pt practice                          PT Long Term Goals - 04/05/21 1732       PT LONG TERM GOAL #1   Title Pt will report decreased right breast heaviness by atleast 50%    Time 6    Period Weeks    Status On-going    Target Date 04/12/21      PT LONG TERM GOAL #2   Title Pt will be independent in self MLD to decrease right breast swelling    Time 6    Period Weeks    Status On-going      PT LONG TERM GOAL #3   Title Pt will be fit for compression bra and will note some improvement in swelling    Time 6    Period Weeks    Status Achieved    Target Date 04/05/21                   Plan - 04/05/21 1718     Clinical Impression Statement Pt continues with significant left breast lymphedema as a result of a right breast lumpectomy with SLNB and radiation. She has had 4 weeks of compression, and exercise, but continues with hyperpigmentation, enlarged pores, fibrosis and swelling/heaviness in the left breast.  She had a basic pump trial with [phone] and failed secondary to no reduction in edema.  She continues with chest swelling and bust measurement taken today is unchanged from  that taken on 04/04/2021.  This pt requires a flexitouch to help her reduce her lymphedema to prevent pain and increased infection that could lead to the need for further medical treatment. She is not able to attend her next 2 scheduled appts  but will  continue to wear compression day and night and to do her MLD daily.    Personal Factors and Comorbidities Comorbidity 2    Comorbidities Right breast Cancer post radiation, chronic LBP    Examination-Activity Limitations Stand;Locomotion Level;Bend;Carry    Stability/Clinical Decision Making Stable/Uncomplicated    Rehab Potential Excellent    PT Frequency 2x / week    PT Duration 6 weeks    PT Treatment/Interventions Taping;Vasopneumatic Device;Manual techniques;Manual lymph drainage;Patient/family education;Therapeutic exercise;Scar mobilization    PT Next Visit Plan performing MLD at home with proper steps, Review with pt right breast MLD, recert or DC    PT Home Exercise Plan MLD to right breast    Consulted and Agree with Plan of Care Patient    Family Member Consulted daughter Roselyn Reef (needs 4:00 appts             Patient will benefit from skilled therapeutic intervention in order to improve the following deficits and impairments:  Postural dysfunction, Increased edema, Decreased knowledge of precautions, Pain  Visit Diagnosis: Malignant neoplasm of upper-outer quadrant of right breast in female, estrogen receptor positive ([place])  Localized edema  Breast swelling  Abnormal posture     Problem List Patient Active Problem List   Diagnosis Date Noted   Port-A-Cath in place 11/21/2019   Malignant neoplasm of upper-outer quadrant of right breast in female, estrogen receptor positive ([place]) 09/17/2019   AKI (acute kidney injury) ([place]) 08/03/2014   Nausea with vomiting 08/03/2014   Dehydration 08/03/2014   Bilateral flank pain 05/09/2012   HTN (hypertension) 05/09/2012   GERD (gastroesophageal reflux disease) 05/09/2012   Adjustment disorder with mixed anxiety and depressed mood 05/09/2012    Claris Pong, PT 04/05/2021, 5:34 PM  [facility] @ [place], [name], [address] Phone: [phone]    Fax:  [phone]  Name: [name] MRN: [id number] Date of Birth: 1950/06/26

## 2021-04-11 ENCOUNTER — Other Ambulatory Visit: Payer: Self-pay | Admitting: Hematology and Oncology

## 2021-04-14 ENCOUNTER — Encounter: Payer: Self-pay | Admitting: Rehabilitation

## 2021-04-14 ENCOUNTER — Ambulatory Visit: Payer: Medicare HMO | Attending: Hematology and Oncology | Admitting: Rehabilitation

## 2021-04-14 ENCOUNTER — Other Ambulatory Visit: Payer: Self-pay

## 2021-04-14 DIAGNOSIS — C50411 Malignant neoplasm of upper-outer quadrant of right female breast: Secondary | ICD-10-CM | POA: Diagnosis not present

## 2021-04-14 DIAGNOSIS — Z170 Estrogen receptor positive status [ER+]: Secondary | ICD-10-CM

## 2021-04-14 DIAGNOSIS — R600 Localized edema: Secondary | ICD-10-CM | POA: Diagnosis present

## 2021-04-14 DIAGNOSIS — R293 Abnormal posture: Secondary | ICD-10-CM | POA: Diagnosis present

## 2021-04-14 DIAGNOSIS — N63 Unspecified lump in unspecified breast: Secondary | ICD-10-CM | POA: Diagnosis present

## 2021-04-14 NOTE — Therapy (Signed)
[facility] ?[facility] @ [place] ?[place], [place], [address] ?Phone: [phone]   Fax:  [phone] ? ?Physical Therapy Treatment ? ?Patient Details  ?Name: [name] ?MRN: [id number] ?Date of Birth: 1951-01-11 ?Referring Provider (PT): Dr. Lindi Adie ? ? ?Encounter Date: 04/14/2021 ? ? PT End of Session - 04/14/21 1653   ? ? Visit Number 12   ? Number of Visits 18   ? Date for PT Re-Evaluation 06/09/21   ? Authorization Type [employer] Medicare - update auth when approved   ? PT Start Time 2694   ? PT Stop Time 8546   ? PT Time Calculation (min) 45 min   ? Activity Tolerance Patient tolerated treatment well   ? Behavior During Therapy [facility] for tasks assessed/performed   ? ?  ?  ? ?  ? ? ?Past Medical History:  ?Diagnosis Date  ? Anxiety   ? Arthritis   ? Cancer ([place]) 09/2019  ? right breast IDC  ? Depression   ? GERD (gastroesophageal reflux disease)   ? Hernia   ? umbilical  ? Hyperlipidemia   ? Hypertension   ? Snores   ? ? ?Past Surgical History:  ?Procedure Laterality Date  ? ABDOMINAL HYSTERECTOMY    ? Partial  ? BACK SURGERY    ? 4 Bilateral Titanium Screws near L4 and L5  ? BREAST LUMPECTOMY WITH RADIOACTIVE SEED AND SENTINEL LYMPH NODE BIOPSY Right 10/01/2019  ? Procedure: RIGHT BREAST LUMPECTOMY WITH RADIOACTIVE SEED AND SENTINEL LYMPH NODE BIOPSY;  Surgeon: Erroll Luna, MD;  Location: [facility];  Service: General;  Laterality: Right;  PEC BLOCK  ? CHOLECYSTECTOMY    ? EXCISION / CURETTAGE BONE CYST PHALANGES OF FOOT Right   ? HEEL SPUR EXCISION Left   ? PORT-A-CATH REMOVAL Right 07/15/2020  ? Procedure: REMOVAL PORT-A-CATH;  Surgeon: Erroll Luna, MD;  Location: [facility];  Service: General;  Laterality: Right;  ? PORTACATH PLACEMENT Right 11/12/2019  ? Procedure: INSERTION PORT-A-CATH WITH ULTRASOUND GUIDANCE, ACCESSED;  Surgeon: Erroll Luna, MD;  Location: [facility];  Service: General;  Laterality: Right;   ? SPINAL CORD STIMULATOR INSERTION  09/26/2012  ? Dr Rolena Infante  ? SPINAL CORD STIMULATOR INSERTION N/A 09/26/2012  ? Procedure: SPINAL CORD STIMULATOR PLACEMENT;  Surgeon: Melina Schools, MD;  Location: [facility];  Service: Orthopedics;  Laterality: N/A;  ? TONSILLECTOMY    ? ? ?There were no vitals filed for this visit. ? ? Subjective Assessment - 04/14/21 1606   ? ? Subjective I have to get my bursa drained at the elbow.  My Lt knee is also really hurting.  The breast is good and bad days.   ? 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  Radiation completed 04/28/2020   other health history of HTN, anxiety, hysterectomy, 6-8 lumbar surgeries over the years including,spinal stimulator that was removed and pt reports only the leads are in place now. Has OA all over. Can't hold arms up long   ? Currently in Pain? Yes   ? Pain Score 9    ? Pain Location Back   ? Pain Orientation Mid;Lower   ? Pain Descriptors / Indicators Aching   ? Pain Type Chronic pain   ? Pain Onset More than a month ago   ? Pain Frequency Constant   ? ?  ?  ? ?  ? ? ? ? ? ? ? ? ? ? ? ? ? ? ? ? ? ? ? ?  [facility] Adult PT Treatment/Exercise - 04/14/21 0001   ? ?  ? Manual Therapy  ? Manual Lymphatic Drainage (MLD) Pt in supine with head of table slightly elevated; MLD to right breast by PT : short neck, 5 diaphragmatic breaths, superficial abdominals,  bilateral axillary LN's, right inguinal LN's, anterior interaxillary pathway, right axillo-inguinal pathway, right breast directing medial breast to upper pathway and lateral breast to axillo-inguinal pathway.  Ended with LN's.   ? ?  ?  ? ?  ? ? ? ? ? ? ? ? ? ? ? ? ? ? ? PT Long Term Goals - 04/14/21 1658   ? ?  ? PT LONG TERM GOAL #1  ? Title Pt will report decreased right breast heaviness by atleast 50%   ? Status On-going   ?  ? PT LONG TERM GOAL #2  ? Title Pt will be independent in self MLD to decrease right breast  swelling   ? Status On-going   ?  ? PT LONG TERM GOAL #3  ? Title Pt will be fit for compression bra and will note some improvement in swelling   ? Status Achieved   ? ?  ?  ? ?  ? ? ? ? ? ? ? ? Plan - 04/14/21 1656   ? ? Clinical Impression Statement Pt continues with significant left breast lymphedema despite self MLD and compression use.  Extended POC 1x per week for up to 6 visits pending [employer] approval.  Continued MLD for the Rt breast pt pt still noting improvements in status post MT which makes continued PT visits medically necessary.   ? PT Frequency 1x / week   ? PT Duration 8 weeks   ? PT Treatment/Interventions Taping;Vasopneumatic Device;Manual techniques;Manual lymph drainage;Patient/family education;Therapeutic exercise;Scar mobilization   ? PT Next Visit Plan performing MLD at home with proper steps, Review with pt right breast MLD   ? Consulted and Agree with Plan of Care Patient   ? ?  ?  ? ?  ? ? ?Patient will benefit from skilled therapeutic intervention in order to improve the following deficits and impairments:    ? ?Visit Diagnosis: ?Malignant neoplasm of upper-outer quadrant of right breast in female, estrogen receptor positive ([place]) ? ?Localized edema ? ?Breast swelling ? ?Abnormal posture ? ? ? ? ?Problem List ?Patient Active Problem List  ? Diagnosis Date Noted  ? Port-A-Cath in place 11/21/2019  ? Malignant neoplasm of upper-outer quadrant of right breast in female, estrogen receptor positive ([place]) 09/17/2019  ? AKI (acute kidney injury) ([place]) 08/03/2014  ? Nausea with vomiting 08/03/2014  ? Dehydration 08/03/2014  ? Bilateral flank pain 05/09/2012  ? HTN (hypertension) 05/09/2012  ? GERD (gastroesophageal reflux disease) 05/09/2012  ? Adjustment disorder with mixed anxiety and depressed mood 05/09/2012  ? ? ?Stark Bray, PT ?04/14/2021, 4:58 PM ? ?[facility] ?[facility] @ [place] ?[place], [place], [address] ?Phone: [phone]   Fax:   [phone] ? ?Name: [name] ?MRN: [id number] ?Date of Birth: 1950-03-14 ? ? ? ?

## 2021-04-19 ENCOUNTER — Ambulatory Visit: Payer: Medicare HMO

## 2021-04-21 ENCOUNTER — Other Ambulatory Visit: Payer: Self-pay

## 2021-04-21 ENCOUNTER — Ambulatory Visit: Payer: Medicare HMO

## 2021-04-21 DIAGNOSIS — R293 Abnormal posture: Secondary | ICD-10-CM

## 2021-04-21 DIAGNOSIS — C50411 Malignant neoplasm of upper-outer quadrant of right female breast: Secondary | ICD-10-CM | POA: Diagnosis not present

## 2021-04-21 DIAGNOSIS — N63 Unspecified lump in unspecified breast: Secondary | ICD-10-CM

## 2021-04-21 DIAGNOSIS — Z170 Estrogen receptor positive status [ER+]: Secondary | ICD-10-CM

## 2021-04-21 DIAGNOSIS — R600 Localized edema: Secondary | ICD-10-CM

## 2021-04-21 NOTE — Therapy (Signed)
[place] ?[facility] ?[place], [place], [address] ?Phone: [phone]   Fax:  [phone] ? ?Physical Therapy Treatment ? ?Patient Details  ?Name: [name] ?MRN: [id number] ?Date of Birth: Dec 31, 1950 ?Referring Provider (PT): Dr. Lindi Adie ? ? ?Encounter Date: 04/21/2021 ? ? PT End of Session - 04/21/21 1607   ? ? Visit Number 13   ? Authorization Type [employer] -   ? Authorization - Visit Number 18 (06/09/2021)  ? PT Start Time 1608   ? PT Stop Time 5035   ? PT Time Calculation (min) 42 min   ? Activity Tolerance Patient tolerated treatment well   ? Behavior During Therapy [facility] for tasks assessed/performed   ? ?  ?  ? ?  ? ? ?Past Medical History:  ?Diagnosis Date  ? Anxiety   ? Arthritis   ? Cancer ([place]) 09/2019  ? right breast IDC  ? Depression   ? GERD (gastroesophageal reflux disease)   ? Hernia   ? umbilical  ? Hyperlipidemia   ? Hypertension   ? Snores   ? ? ?Past Surgical History:  ?Procedure Laterality Date  ? ABDOMINAL HYSTERECTOMY    ? Partial  ? BACK SURGERY    ? 4 Bilateral Titanium Screws near L4 and L5  ? BREAST LUMPECTOMY WITH RADIOACTIVE SEED AND SENTINEL LYMPH NODE BIOPSY Right 10/01/2019  ? Procedure: RIGHT BREAST LUMPECTOMY WITH RADIOACTIVE SEED AND SENTINEL LYMPH NODE BIOPSY;  Surgeon: Erroll Luna, MD;  Location: [place];  Service: General;  Laterality: Right;  PEC BLOCK  ? CHOLECYSTECTOMY    ? EXCISION / CURETTAGE BONE CYST PHALANGES OF FOOT Right   ? HEEL SPUR EXCISION Left   ? PORT-A-CATH REMOVAL Right 07/15/2020  ? Procedure: REMOVAL PORT-A-CATH;  Surgeon: Erroll Luna, MD;  Location: [place];  Service: General;  Laterality: Right;  ? PORTACATH PLACEMENT Right 11/12/2019  ? Procedure: INSERTION PORT-A-CATH WITH ULTRASOUND GUIDANCE, ACCESSED;  Surgeon: Erroll Luna, MD;  Location: [place];  Service: General;  Laterality: Right;  ? SPINAL CORD STIMULATOR INSERTION   09/26/2012  ? Dr Rolena Infante  ? SPINAL CORD STIMULATOR INSERTION N/A 09/26/2012  ? Procedure: SPINAL CORD STIMULATOR PLACEMENT;  Surgeon: Melina Schools, MD;  Location: [place];  Service: Orthopedics;  Laterality: N/A;  ? TONSILLECTOMY    ? ? ?There were no vitals filed for this visit. ? ? Subjective Assessment - 04/21/21 1608   ? ? Subjective Sunday night I was cleaning the tub and I slipped and hit my right breast on the tub.  it really hurt at the time, but its feeling better now. Until I hit myself on the tub I felt like my swelling was getting better.  I do the MLD several times per day but now it feels swollen again   ? 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  Radiation completed 04/28/2020   other health history of HTN, anxiety, hysterectomy, 6-8 lumbar surgeries over the years including,spinal stimulator that was removed and pt reports only the leads are in place now. Has OA all over. Can't hold arms up long   ? Limitations Walking   ? Patient Stated Goals Decrease breast swelling/ heaviness   ? Currently in Pain? Yes   ? Pain Score 9    ? Pain Location Back   ? Pain Descriptors / Indicators Aching   ?  Pain Type Chronic pain   ? Pain Onset More than a month ago   ? Pain Frequency Constant   ? Multiple Pain Sites No   ? ?  ?  ? ?  ? ? ? ? ? ? ? ? ? ? ? ? ? ? ? ? ? ? ? ? [place] Adult PT Treatment/Exercise - 04/21/21 0001   ? ?  ? Manual Therapy  ? Manual Lymphatic Drainage (MLD) Pt in supine with head of table slightly elevated; MLD to right breast by PT : short neck, 5 diaphragmatic breaths, superficial abdominals,  bilateral axillary LN's, right inguinal LN's, anterior interaxillary pathway, right axillo-inguinal pathway, right breast directing medial breast to upper pathway and lateral breast to axillo-inguinal pathway.  Ended with LN's.   ? ?  ?  ? ?  ? ? ? ? ? ? ? ? ? ? ? ? ? ? ? PT Long Term Goals - 04/14/21 1658   ? ?  ?  PT LONG TERM GOAL #1  ? Title Pt will report decreased right breast heaviness by atleast 50%   ? Status On-going   ?  ? PT LONG TERM GOAL #2  ? Title Pt will be independent in self MLD to decrease right breast swelling   ? Status On-going   ?  ? PT LONG TERM GOAL #3  ? Title Pt will be fit for compression bra and will note some improvement in swelling   ? Status Achieved   ? ?  ?  ? ?  ? ? ? ? ? ? ? ? Plan - 04/21/21 1615   ? ? Clinical Impression Statement Pt lost her balance while cleaning her tub hitting her right breast hard on the tub and causing increased swelling and pain. Pain was improved today. THere was no evidence of bruising or abrasions however pt notes the breast feels heavy again. Continued manual lymphatic drainage with pt and pt felt great after todays visit, with no complaints of increased heaviness   ? Personal Factors and Comorbidities Comorbidity 2   ? Comorbidities Right breast Cancer post radiation, chronic LBP   ? Examination-Activity Limitations Stand;Locomotion Level;Bend;Carry   ? Stability/Clinical Decision Making Stable/Uncomplicated   ? Rehab Potential Excellent   ? PT Frequency 1x / week   ? PT Duration 8 weeks   ? PT Treatment/Interventions Taping;Vasopneumatic Device;Manual techniques;Manual lymph drainage;Patient/family education;Therapeutic exercise;Scar mobilization   ? PT Next Visit Plan Measure UE and chest week of March 20 and send to flexitouch,performing MLD at home with proper steps, Review with pt right breast MLD   ? PT Home Exercise Plan MLD to right breast   ? Consulted and Agree with Plan of Care Patient   ? ?  ?  ? ?  ? ? ?Patient will benefit from skilled therapeutic intervention in order to improve the following deficits and impairments:  Postural dysfunction, Increased edema, Decreased knowledge of precautions, Pain ? ?Visit Diagnosis: ?Malignant neoplasm of upper-outer quadrant of right breast in female, estrogen receptor positive ([place]) ? ?Localized  edema ? ?Breast swelling ? ?Abnormal posture ? ? ? ? ?Problem List ?Patient Active Problem List  ? Diagnosis Date Noted  ? Port-A-Cath in place 11/21/2019  ? Malignant neoplasm of upper-outer quadrant of right breast in female, estrogen receptor positive ([place]) 09/17/2019  ? AKI (acute kidney injury) ([place]) 08/03/2014  ? Nausea with vomiting 08/03/2014  ? Dehydration 08/03/2014  ? Bilateral flank pain 05/09/2012  ? HTN (hypertension) 05/09/2012  ?  GERD (gastroesophageal reflux disease) 05/09/2012  ? Adjustment disorder with mixed anxiety and depressed mood 05/09/2012  ? ? ?Claris Pong, PT ?04/21/2021, 4:56 PM ? ?[facility] ?[facility] ?[place], [place], [address] ?Phone: [phone]   Fax:  [phone] ? ?Name: [name] ?MRN: [id number] ?Date of Birth: 1950-10-07 ? ? ? ?

## 2021-05-11 ENCOUNTER — Other Ambulatory Visit: Payer: Self-pay

## 2021-05-11 ENCOUNTER — Ambulatory Visit: Payer: Medicare HMO | Admitting: Rehabilitation

## 2021-05-11 ENCOUNTER — Encounter: Payer: Self-pay | Admitting: Rehabilitation

## 2021-05-11 DIAGNOSIS — R293 Abnormal posture: Secondary | ICD-10-CM

## 2021-05-11 DIAGNOSIS — R600 Localized edema: Secondary | ICD-10-CM

## 2021-05-11 DIAGNOSIS — C50411 Malignant neoplasm of upper-outer quadrant of right female breast: Secondary | ICD-10-CM

## 2021-05-11 DIAGNOSIS — N63 Unspecified lump in unspecified breast: Secondary | ICD-10-CM

## 2021-05-11 NOTE — Therapy (Signed)
[place] ?[facility] ?[place], [place], [address] ?Phone: [phone]   Fax:  [phone] ? ?Physical Therapy Treatment ? ?Patient Details  ?Name: [name] ?MRN: [id number] ?Date of Birth: 20-Apr-1950 ?Referring Provider (PT): Dr. Lindi Adie ? ? ?Encounter Date: 05/11/2021 ? ? PT End of Session - 05/11/21 1657   ? ? Visit Number 15   KX needed  ? Number of Visits 18   ? Date for PT Re-Evaluation 06/09/21   ? Authorization - Visit Number 3   ? Authorization - Number of Visits 6   ? PT Start Time 0488   ? PT Stop Time 1655   ? PT Time Calculation (min) 48 min   ? Activity Tolerance Patient tolerated treatment well   ? Behavior During Therapy [facility] for tasks assessed/performed   ? ?  ?  ? ?  ? ? ?Past Medical History:  ?Diagnosis Date  ? Anxiety   ? Arthritis   ? Cancer ([place]) 09/2019  ? right breast IDC  ? Depression   ? GERD (gastroesophageal reflux disease)   ? Hernia   ? umbilical  ? Hyperlipidemia   ? Hypertension   ? Snores   ? ? ?Past Surgical History:  ?Procedure Laterality Date  ? ABDOMINAL HYSTERECTOMY    ? Partial  ? BACK SURGERY    ? 4 Bilateral Titanium Screws near L4 and L5  ? BREAST LUMPECTOMY WITH RADIOACTIVE SEED AND SENTINEL LYMPH NODE BIOPSY Right 10/01/2019  ? Procedure: RIGHT BREAST LUMPECTOMY WITH RADIOACTIVE SEED AND SENTINEL LYMPH NODE BIOPSY;  Surgeon: Erroll Luna, MD;  Location: [place];  Service: General;  Laterality: Right;  PEC BLOCK  ? CHOLECYSTECTOMY    ? EXCISION / CURETTAGE BONE CYST PHALANGES OF FOOT Right   ? HEEL SPUR EXCISION Left   ? PORT-A-CATH REMOVAL Right 07/15/2020  ? Procedure: REMOVAL PORT-A-CATH;  Surgeon: Erroll Luna, MD;  Location: [place];  Service: General;  Laterality: Right;  ? PORTACATH PLACEMENT Right 11/12/2019  ? Procedure: INSERTION PORT-A-CATH WITH ULTRASOUND GUIDANCE, ACCESSED;  Surgeon: Erroll Luna, MD;  Location: [place];  Service: General;   Laterality: Right;  ? SPINAL CORD STIMULATOR INSERTION  09/26/2012  ? Dr Rolena Infante  ? SPINAL CORD STIMULATOR INSERTION N/A 09/26/2012  ? Procedure: SPINAL CORD STIMULATOR PLACEMENT;  Surgeon: Melina Schools, MD;  Location: [place];  Service: Orthopedics;  Laterality: N/A;  ? TONSILLECTOMY    ? ? ?There were no vitals filed for this visit. ? ? Subjective Assessment - 05/11/21 1608   ? ? Subjective The breast went down and now puffed up now.  It comes and goes.   ? 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  Radiation completed 04/28/2020   other health history of HTN, anxiety, hysterectomy, 6-8 lumbar surgeries over the years including,spinal stimulator that was removed and pt reports only the leads are in place now. Has OA all over. Can't hold arms up long   ? Currently in Pain? Yes   ? Pain Score 9    ? Pain Location Back   ? Pain Orientation Mid;Lower   ? Pain Descriptors / Indicators Aching   ? Pain Type Chronic pain   ? ?  ?  ? ?  ? ? ? ? ? ? ? ? LYMPHEDEMA/ONCOLOGY QUESTIONNAIRE - 05/11/21 0001   ? ?  ? Right  Upper Extremity Lymphedema  ? 10 cm Proximal to Olecranon Process 31 cm   ? Olecranon Process 25.5 cm   ? 10 cm Proximal to Ulnar Styloid Process 22.5 cm   ? Just Proximal to Ulnar Styloid Process 16.1 cm   ? Across Hand at [employer] 19 cm   ? At [facility] of 2nd Digit 6.3 cm   ? Other bust 130cm   ?  ? Left Upper Extremity Lymphedema  ? 10 cm Proximal to Olecranon Process 31 cm   ? Olecranon Process 25.5 cm   ? 10 cm Proximal to Ulnar Styloid Process 22.5 cm   ? At [facility] of 2nd Digit 6.3 cm   ? ?  ?  ? ?  ? ? ? ? ? ? ? ? ? ? ? ? ? [place] Adult PT Treatment/Exercise - 05/11/21 0001   ? ?  ? Manual Therapy  ? Manual therapy comments remeasured bil UEs and chest   ? Manual Lymphatic Drainage (MLD) Pt in supine with head of table slightly elevated; MLD to right breast by PT : short neck, 5 diaphragmatic breaths, superficial  abdominals,  bilateral axillary LN's, right inguinal LN's, anterior interaxillary pathway, right axillo-inguinal pathway, right breast directing medial breast to upper pathway and lateral breast to axillo-inguinal pathway. Then posterior interaxillary work in left sidelying and then Ended with LN's.in supine   ? ?  ?  ? ?  ? ? ? ? ? ?Pt continues with chronic lymphedema after at least 4 weeks of compression, elevation, and exercises provided with PT treatment and self management.  ? ?Pt demonstrates hyperplasia with increased size and is at risk for hyperkeratosis and fibrosis with untreated lymphedema.   ? ?Truncal swelling is present and chronic in nature ? ?Pt has also failed a basic pump at this time and would benefit from an advanced model to provide proximal clearance and a multi chambered approach ? ? ? ? ? ? ? ? ? ? PT Long Term Goals - 04/14/21 1658   ? ?  ? PT LONG TERM GOAL #1  ? Title Pt will report decreased right breast heaviness by atleast 50%   ? Status On-going   ?  ? PT LONG TERM GOAL #2  ? Title Pt will be independent in self MLD to decrease right breast swelling   ? Status On-going   ?  ? PT LONG TERM GOAL #3  ? Title Pt will be fit for compression bra and will note some improvement in swelling   ? Status Achieved   ? ?  ?  ? ?  ? ? ? ? ? ? ? ? Plan - 05/11/21 1659   ? ? Clinical Impression Statement Pt continues to have intermittent Rt breast edema that fluctuates off and on.  Measurements are consistent and remain the same over the past 4 weeks, pt is ind with self MLD, use of compression, and exercise.  Pt will benefit from flexitouch pump to allow for prevention of fibrosis and to decrease risk of infection from chronic lymphedema.   ? PT Frequency 1x / week   ? PT Duration 8 weeks   ? PT Treatment/Interventions Taping;Vasopneumatic Device;Manual techniques;Manual lymph drainage;Patient/family education;Therapeutic exercise;Scar mobilization   ? PT Next Visit Plan right breast MLD   ?  Consulted and Agree with Plan of Care Patient   ? Family Member Consulted daughter Roselyn Reef (needs 4:00 appts   ? ?  ?  ? ?  ? ? ?  Patient will benefit from skilled therapeutic intervention in order to improve the following deficits and impairments:    ? ?Visit Diagnosis: ?Malignant neoplasm of upper-outer quadrant of right breast in female, estrogen receptor positive ([place]) ? ?Localized edema ? ?Abnormal posture ? ?Breast swelling ? ? ? ? ?Problem List ?Patient Active Problem List  ? Diagnosis Date Noted  ? Port-A-Cath in place 11/21/2019  ? Malignant neoplasm of upper-outer quadrant of right breast in female, estrogen receptor positive ([place]) 09/17/2019  ? AKI (acute kidney injury) ([place]) 08/03/2014  ? Nausea with vomiting 08/03/2014  ? Dehydration 08/03/2014  ? Bilateral flank pain 05/09/2012  ? HTN (hypertension) 05/09/2012  ? GERD (gastroesophageal reflux disease) 05/09/2012  ? Adjustment disorder with mixed anxiety and depressed mood 05/09/2012  ? ? ?Stark Bray, PT ?05/11/2021, 5:01 PM ? ?[facility] ?[facility] ?[place], [place], [address] ?Phone: [phone]   Fax:  [phone] ? ?Name: [name] ?MRN: [id number] ?Date of Birth: [birth year] ? ? ? ?

## 2021-07-07 ENCOUNTER — Other Ambulatory Visit: Payer: Self-pay | Admitting: Hematology and Oncology

## 2021-07-22 ENCOUNTER — Encounter (HOSPITAL_BASED_OUTPATIENT_CLINIC_OR_DEPARTMENT_OTHER): Payer: Self-pay | Admitting: Orthopedic Surgery

## 2021-07-22 NOTE — Progress Notes (Addendum)
Spoke w/ via phone for pre-op interview---pt Lab needs dos----  istat, ekg COVID test -----patient states asymptomatic no test needed Arrive at -------1145 am 07-27-2021 NPO after MN NO Solid Food.  Clear liquids from MN until---1045 am Med rec completed Medications to take morning of surgery -----armidex, omeprazole, anastrozole, rosuvastatin, sertraline Diabetic medication -----n/a Patient instructed no nail polish to be worn day of surgery Patient instructed to bring photo id and insurance card day of surgery Patient aware to have Driver (ride ) / caregiver  daughter jamie Hlavac or sister Bevely Palmer   for 24 hours after surgery  Patient Special Instructions -----none Pre-Op special Istructions -----none Patient verbalized understanding of instructions that were given at this phone interview. Patient denies shortness of breath, chest pain, fever, cough at this phone interview.   Patient on cephalexin  500 mg tid since 07-18-2021 and pt states no problems with taking this, called ashley at dr spears office and left message for ashley to make dr spears aware patient had alleriic reaction to ceftin with swelling or renal problems (patient not sure reaction to ceftin years ago), left message for ashley to call me back and make dr spears aware of patient allergy to ceftin.  Spoke with Caryl Pina at dr Greta Doom office, Caryl Pina to make dr spears aware of cetfin allergy and patient currently taking cephalaxin.

## 2021-07-24 NOTE — H&P (Signed)
Preoperative History & Physical Exam  Surgeon: Matt Holmes, MD  Diagnosis: left elbow olecranon bursitis  Planned Procedure: Procedure(s) (LRB): left elbow olecranon bursectomy (Left)  History of Present Illness:   Patient is a 71 y.o. female with symptoms consistent with left elbow olecranon bursitis who presents for surgical intervention. The risks, benefits and alternatives of surgical intervention were discussed and informed consent was obtained prior to surgery.  Past Medical History:  Past Medical History:  Diagnosis Date   AKI (acute kidney injury) ([place]) 2016   all issues resolved yrs ago per pt on 07-22-2021   Anxiety    Arthritis    Cancer ([place]) 09/2019   right breast IDC   Depression    GERD (gastroesophageal reflux disease)    Hernia    umbilical   Hyperlipidemia    Hypertension    Olecranon bursitis, left elbow     Past Surgical History:  Past Surgical History:  Procedure Laterality Date   ABDOMINAL HYSTERECTOMY     Partial 30-40 yrs ago   BACK SURGERY     4 Bilateral Titanium Screws near L4 and L5   BREAST LUMPECTOMY WITH RADIOACTIVE SEED AND SENTINEL LYMPH NODE BIOPSY Right 10/01/2019   Procedure: RIGHT BREAST LUMPECTOMY WITH RADIOACTIVE SEED AND SENTINEL LYMPH NODE BIOPSY;  Surgeon: Erroll Luna, MD;  Location: [place];  Service: General;  Laterality: Right;  [place]     2001 or 2002   EXCISION / CURETTAGE BONE CYST PHALANGES OF FOOT Right    HEEL SPUR EXCISION Left    keloids removed from ears     yrs ago   PORT-A-CATH REMOVAL Right 07/15/2020   Procedure: REMOVAL PORT-A-CATH;  Surgeon: Erroll Luna, MD;  Location: [place];  Service: General;  Laterality: Right;   PORTACATH PLACEMENT Right 11/12/2019   Procedure: INSERTION PORT-A-CATH WITH ULTRASOUND GUIDANCE, ACCESSED;  Surgeon: Erroll Luna, MD;  Location: [place];  Service: General;  Laterality: Right;   SPINAL  CORD STIMULATOR INSERTION  09/26/2012   Dr Rolena Infante   SPINAL CORD STIMULATOR INSERTION N/A 09/26/2012   Procedure: SPINAL CORD STIMULATOR PLACEMENT;  Surgeon: Melina Schools, MD;  Location: [place];  Service: Orthopedics;  Laterality: N/A;   SPINAL CORD STIMULATOR REMOVAL     2013 or 2014, leads left in per patient   TONSILLECTOMY     age 19    Medications:  Prior to Admission medications   Medication Sig Start Date End Date Taking? Authorizing Provider  Cephalexin 500 MG tablet Take 500 mg by mouth 3 (three) times daily. Started 6-07-18-2021   Yes [provider]  Multiple Vitamins-Minerals (MULTIVITAMIN WITH MINERALS) tablet Take 1 tablet by mouth daily.   Yes [provider]  ALPRAZolam (XANAX) 0.5 MG tablet Take 0.25-0.5 mg by mouth 2 (two) times daily as needed. For anxiety    [provider]  amLODipine-benazepril (LOTREL) 10-20 MG per capsule Take 1 capsule by mouth daily.    [provider]  anastrozole (ARIMIDEX) 1 MG tablet TAKE ONE TABLET BY MOUTH ONE TIME DAILY 07/07/21   Nicholas Lose, MD  cholecalciferol (VITAMIN D) 400 UNITS TABS Take 100 Units by mouth daily.    [provider]  eszopiclone 3 MG TABS Take 1 tablet (3 mg total) by mouth at bedtime as needed for sleep. Take immediately before bedtime 12/25/19   Nicholas Lose, MD  hydrochlorothiazide (HYDRODIURIL) 25 MG tablet Take 25 mg by mouth daily.  [provider]  omeprazole (PRILOSEC) 20 MG capsule Take 1 capsule (20 mg total) by mouth daily. 11/13/19   Nicholas Lose, MD  rosuvastatin (CRESTOR) 10 MG tablet  09/01/19   [provider]  sertraline (ZOLOFT) 100 MG tablet Take 200 mg by mouth daily.    [provider]    Allergies:  Ceftin [cefuroxime] and Zofran [ondansetron]  Review of Systems: Negative except per HPI.  Physical Exam: Alert and oriented, NAD Head and neck: no masses, normal alignment CV: pulse intact Pulm: no increased work of  breathing, respirations even and unlabored Abdomen: non-distended Extremities: extremities warm and well perfused  LABS: No results found for this or any previous visit (from the past 2160 hour(s)).   Complete History and Physical exam available in the office notes  Orene Desanctis

## 2021-07-27 ENCOUNTER — Encounter (HOSPITAL_BASED_OUTPATIENT_CLINIC_OR_DEPARTMENT_OTHER): Payer: Self-pay | Admitting: Orthopedic Surgery

## 2021-07-27 ENCOUNTER — Other Ambulatory Visit: Payer: Self-pay

## 2021-07-27 ENCOUNTER — Ambulatory Visit (HOSPITAL_BASED_OUTPATIENT_CLINIC_OR_DEPARTMENT_OTHER)
Admission: RE | Admit: 2021-07-27 | Discharge: 2021-07-27 | Disposition: A | Discharge status: Home / Self Care | Payer: Medicare HMO | Attending: Orthopedic Surgery | Admitting: Orthopedic Surgery

## 2021-07-27 ENCOUNTER — Ambulatory Visit (HOSPITAL_BASED_OUTPATIENT_CLINIC_OR_DEPARTMENT_OTHER): Payer: Medicare HMO | Admitting: Anesthesiology

## 2021-07-27 ENCOUNTER — Encounter (HOSPITAL_BASED_OUTPATIENT_CLINIC_OR_DEPARTMENT_OTHER)
Admission: RE | Disposition: A | Discharge status: Home / Self Care | Payer: Self-pay | Source: Home / Self Care | Attending: Orthopedic Surgery

## 2021-07-27 DIAGNOSIS — Z6835 Body mass index (BMI) 35.0-35.9, adult: Secondary | ICD-10-CM | POA: Diagnosis not present

## 2021-07-27 DIAGNOSIS — F32A Depression, unspecified: Secondary | ICD-10-CM | POA: Diagnosis not present

## 2021-07-27 DIAGNOSIS — F419 Anxiety disorder, unspecified: Secondary | ICD-10-CM | POA: Diagnosis not present

## 2021-07-27 DIAGNOSIS — Z01818 Encounter for other preprocedural examination: Secondary | ICD-10-CM

## 2021-07-27 DIAGNOSIS — I10 Essential (primary) hypertension: Secondary | ICD-10-CM

## 2021-07-27 DIAGNOSIS — N289 Disorder of kidney and ureter, unspecified: Secondary | ICD-10-CM | POA: Diagnosis not present

## 2021-07-27 DIAGNOSIS — M009 Pyogenic arthritis, unspecified: Secondary | ICD-10-CM | POA: Diagnosis not present

## 2021-07-27 DIAGNOSIS — K219 Gastro-esophageal reflux disease without esophagitis: Secondary | ICD-10-CM | POA: Insufficient documentation

## 2021-07-27 DIAGNOSIS — M7022 Olecranon bursitis, left elbow: Secondary | ICD-10-CM | POA: Diagnosis present

## 2021-07-27 DIAGNOSIS — F418 Other specified anxiety disorders: Secondary | ICD-10-CM

## 2021-07-27 HISTORY — PX: OLECRANON BURSECTOMY: SHX2097

## 2021-07-27 HISTORY — DX: Olecranon bursitis, left elbow: M70.22

## 2021-07-27 LAB — NO BLOOD PRODUCTS

## 2021-07-27 LAB — POCT I-STAT, CHEM 8
BUN: 14 mg/dL (ref 8–23)
Calcium, Ion: 1.18 mmol/L (ref 1.15–1.40)
Chloride: 104 mmol/L (ref 98–111)
Creatinine, Ser: 0.8 mg/dL (ref 0.44–1.00)
Glucose, Bld: 112 mg/dL — ABNORMAL HIGH (ref 70–99)
HCT: 43 % (ref 36.0–46.0)
Hemoglobin: 14.6 g/dL (ref 12.0–15.0)
Potassium: 3.1 mmol/L — ABNORMAL LOW (ref 3.5–5.1)
Sodium: 142 mmol/L (ref 135–145)
TCO2: 24 mmol/L (ref 22–32)

## 2021-07-27 SURGERY — BURSECTOMY, ELBOW
Anesthesia: General | Site: Elbow | Laterality: Left

## 2021-07-27 MED ORDER — FENTANYL CITRATE (PF) 100 MCG/2ML IJ SOLN
INTRAMUSCULAR | Status: AC
  Filled 2021-07-27: qty 2

## 2021-07-27 MED ORDER — 0.9 % SODIUM CHLORIDE (POUR BTL) OPTIME
TOPICAL | Status: DC | PRN
  Administered 2021-07-27: 500 mL

## 2021-07-27 MED ORDER — DIPHENHYDRAMINE HCL 50 MG/ML IJ SOLN
INTRAMUSCULAR | Status: DC | PRN
  Administered 2021-07-27: 12.5 mg via INTRAVENOUS

## 2021-07-27 MED ORDER — DIPHENHYDRAMINE HCL 50 MG/ML IJ SOLN
INTRAMUSCULAR | Status: AC
  Filled 2021-07-27: qty 1

## 2021-07-27 MED ORDER — FENTANYL CITRATE (PF) 100 MCG/2ML IJ SOLN
INTRAMUSCULAR | Status: DC | PRN
  Administered 2021-07-27 (×2): 25 ug via INTRAVENOUS
  Administered 2021-07-27: 100 ug via INTRAVENOUS
  Administered 2021-07-27: 50 ug via INTRAVENOUS

## 2021-07-27 MED ORDER — BUPIVACAINE HCL (PF) 0.5 % IJ SOLN
INTRAMUSCULAR | Status: DC | PRN
  Administered 2021-07-27: 20 mL

## 2021-07-27 MED ORDER — DEXAMETHASONE SODIUM PHOSPHATE 10 MG/ML IJ SOLN
INTRAMUSCULAR | Status: DC | PRN
  Administered 2021-07-27: 10 mg via INTRAVENOUS

## 2021-07-27 MED ORDER — DEXAMETHASONE SODIUM PHOSPHATE 10 MG/ML IJ SOLN
INTRAMUSCULAR | Status: AC
  Filled 2021-07-27: qty 1

## 2021-07-27 MED ORDER — ACETAMINOPHEN 500 MG PO TABS
ORAL_TABLET | ORAL | Status: AC
  Filled 2021-07-27: qty 2

## 2021-07-27 MED ORDER — ACETAMINOPHEN 500 MG PO TABS
1000.0000 mg | ORAL_TABLET | Freq: Once | ORAL | Status: AC
  Administered 2021-07-27: 1000 mg via ORAL

## 2021-07-27 MED ORDER — PROPOFOL 10 MG/ML IV BOLUS
INTRAVENOUS | Status: DC | PRN
  Administered 2021-07-27: 200 mg via INTRAVENOUS

## 2021-07-27 MED ORDER — SODIUM CHLORIDE 0.9 % IR SOLN
Status: DC | PRN
  Administered 2021-07-27: 3000 mL

## 2021-07-27 MED ORDER — PROPOFOL 10 MG/ML IV BOLUS
INTRAVENOUS | Status: AC
  Filled 2021-07-27: qty 20

## 2021-07-27 MED ORDER — CEFAZOLIN SODIUM-DEXTROSE 2-4 GM/100ML-% IV SOLN
INTRAVENOUS | Status: AC
  Filled 2021-07-27: qty 100

## 2021-07-27 MED ORDER — LACTATED RINGERS IV SOLN: INTRAVENOUS | Status: DC

## 2021-07-27 MED ORDER — BACITRACIN ZINC 500 UNIT/GM EX OINT
TOPICAL_OINTMENT | CUTANEOUS | Status: DC | PRN
  Administered 2021-07-27: 1 via TOPICAL

## 2021-07-27 MED ORDER — CEFAZOLIN SODIUM-DEXTROSE 2-3 GM-%(50ML) IV SOLR
INTRAVENOUS | Status: DC | PRN
  Administered 2021-07-27: 2 g via INTRAVENOUS

## 2021-07-27 MED ORDER — LIDOCAINE 2% (20 MG/ML) 5 ML SYRINGE
INTRAMUSCULAR | Status: DC | PRN
  Administered 2021-07-27: 40 mg via INTRAVENOUS

## 2021-07-27 MED ORDER — FENTANYL CITRATE (PF) 100 MCG/2ML IJ SOLN: 25.0000 ug | INTRAMUSCULAR | Status: DC | PRN

## 2021-07-27 MED ORDER — IBUPROFEN 800 MG PO TABS
800.0000 mg | ORAL_TABLET | Freq: Three times a day (TID) | ORAL | 0 refills | Status: AC | PRN
Start: 2021-07-27 — End: ?

## 2021-07-27 SURGICAL SUPPLY — 42 items
BLADE SURG 15 STRL LF DISP TIS (BLADE) ×2 IMPLANT
BLADE SURG 15 STRL SS (BLADE) ×2
BNDG CMPR 9X4 STRL LF SNTH (GAUZE/BANDAGES/DRESSINGS) ×1
BNDG ELASTIC 3X5.8 VLCR STR LF (GAUZE/BANDAGES/DRESSINGS) ×1 IMPLANT
BNDG ELASTIC 4X5.8 VLCR STR LF (GAUZE/BANDAGES/DRESSINGS) ×3 IMPLANT
BNDG ESMARK 4X9 LF (GAUZE/BANDAGES/DRESSINGS) ×3 IMPLANT
CORD BIPOLAR FORCEPS 12FT (ELECTRODE) IMPLANT
COVER BACK TABLE 60X90IN (DRAPES) ×3 IMPLANT
CUFF TOURN SGL QUICK 18X4 (TOURNIQUET CUFF) ×3 IMPLANT
DECANTER SPIKE VIAL GLASS SM (MISCELLANEOUS) IMPLANT
DRAPE EXTREMITY T 121X128X90 (DISPOSABLE) ×3 IMPLANT
DRAPE SHEET LG 3/4 BI-LAMINATE (DRAPES) ×3 IMPLANT
DRSG EMULSION OIL 3X3 NADH (GAUZE/BANDAGES/DRESSINGS) ×3 IMPLANT
GAUZE 4X4 16PLY ~~LOC~~+RFID DBL (SPONGE) ×3 IMPLANT
GAUZE SPONGE 4X4 12PLY STRL (GAUZE/BANDAGES/DRESSINGS) ×3 IMPLANT
GAUZE SPONGE 4X4 8PLY NS (GAUZE/BANDAGES/DRESSINGS) ×1 IMPLANT
GLOVE BIO SURGEON STRL SZ8 (GLOVE) ×3 IMPLANT
GLOVE BIOGEL M STRL SZ7.5 (GLOVE) IMPLANT
GLOVE BIOGEL PI IND STRL 7.5 (GLOVE) ×2 IMPLANT
GLOVE BIOGEL PI INDICATOR 7.5 (GLOVE) ×1
GOWN STRL REUS W/TWL LRG LVL3 (GOWN DISPOSABLE) ×3 IMPLANT
KIT TURNOVER CYSTO (KITS) ×3 IMPLANT
NEEDLE HYPO 22GX1.5 SAFETY (NEEDLE) IMPLANT
NS IRRIG 500ML POUR BTL (IV SOLUTION) ×3 IMPLANT
PACK BASIN DAY SURGERY FS (CUSTOM PROCEDURE TRAY) ×3 IMPLANT
PAD CAST 4YDX4 CTTN HI CHSV (CAST SUPPLIES) ×4 IMPLANT
PADDING CAST ABS 4INX4YD NS (CAST SUPPLIES) ×3
PADDING CAST ABS COTTON 4X4 ST (CAST SUPPLIES) IMPLANT
PADDING CAST COTTON 4X4 STRL (CAST SUPPLIES) ×4
SET IRRIG Y TYPE TUR BLADDER L (SET/KITS/TRAYS/PACK) ×1 IMPLANT
SUT BONE WAX W31G (SUTURE) IMPLANT
SUT ETHILON 4 0 PS 2 18 (SUTURE) ×4 IMPLANT
SUT VIC AB 3-0 FS2 27 (SUTURE) IMPLANT
SUT VIC AB 4-0 PS2 27 (SUTURE) ×3 IMPLANT
SYR 10ML LL (SYRINGE) IMPLANT
SYR BULB EAR ULCER 3OZ GRN STR (SYRINGE) ×3 IMPLANT
TAPE SURG TRANSPORE 1 IN (GAUZE/BANDAGES/DRESSINGS) ×2 IMPLANT
TAPE SURGICAL TRANSPORE 1 IN (GAUZE/BANDAGES/DRESSINGS) ×2
TOWEL OR 17X26 10 PK STRL BLUE (TOWEL DISPOSABLE) ×3 IMPLANT
TUBE CONNECTING 12X1/4 (SUCTIONS) ×1 IMPLANT
UNDERPAD 30X36 HEAVY ABSORB (UNDERPADS AND DIAPERS) ×3 IMPLANT
YANKAUER SUCT BULB TIP NO VENT (SUCTIONS) ×1 IMPLANT

## 2021-07-27 NOTE — Interval H&P Note (Signed)
History and Physical Interval Note:  07/27/2021 1:15 PM  [name]  has presented today for surgery, with the diagnosis of left elbow olecranon bursitis.  The various methods of treatment have been discussed with the patient and family. After consideration of risks, benefits and other options for treatment, the patient has consented to  Procedure(s): left elbow olecranon bursectomy (Left) as a surgical intervention.  The patient's history has been reviewed, patient examined, no change in status, stable for surgery.  I have reviewed the patient's chart and labs.  Questions were answered to the patient's satisfaction.     Orene Desanctis

## 2021-07-27 NOTE — Anesthesia Postprocedure Evaluation (Signed)
Anesthesia Post Note  Patient: [name]  Procedure(s) Performed: left elbow olecranon bursectomy (Left: Elbow)     Patient location during evaluation: PACU Anesthesia Type: General Level of consciousness: awake and alert, patient cooperative and oriented Pain management: pain level controlled Vital Signs Assessment: post-procedure vital signs reviewed and stable Respiratory status: spontaneous breathing, nonlabored ventilation and respiratory function stable Cardiovascular status: blood pressure returned to baseline and stable Postop Assessment: no apparent nausea or vomiting and adequate PO intake Anesthetic complications: no   No notable events documented.  Last Vitals:  Vitals:   07/27/21 1440 07/27/21 1445  BP: (!) 166/84 (!) 164/77  Pulse: 74 69  Resp: 16 13  Temp: 36.9 C   SpO2: 98% 95%    Last Pain:  Vitals:   07/27/21 1445  TempSrc:   PainSc: 0-No pain                 Ravin Denardo,E. Ryin Schillo

## 2021-07-27 NOTE — Op Note (Signed)
OPERATIVE NOTE  DATE OF PROCEDURE: 07/27/2021  SURGEONS:  Primary: Orene Desanctis, MD  PREOPERATIVE DIAGNOSIS: LEFT elbow olecranon bursitis, infected, chronic  POSTOPERATIVE DIAGNOSIS: Same  NAME OF PROCEDURE:   LEFT elbow olecranon bursa excision, I&D  ANESTHESIA: Monitor Anesthesia Care + Local  SKIN PREPARATION: Hibiclens  ESTIMATED BLOOD LOSS: Minimal  IMPLANTS: none  INDICATIONS:  [name] is a 71 y.o. female who has the above preoperative diagnosis. The patient has decided to proceed with surgical intervention.  Risks, benefits and alternatives of operative management were discussed including, but not limited to, risks of anesthesia complications, infection, pain, persistent symptoms, stiffness, need for future surgery.  The patient understands, agrees and elects to proceed with surgery.    DESCRIPTION OF PROCEDURE: The patient was met in the pre-operative area and their identity was verified.  The operative location and laterality was also verified and marked.  The patient was brought to the OR and was placed supine on the table.  After repeat patient identification with the operative team anesthesia was provided and the patient was prepped and draped in the usual sterile fashion.  A final timeout was performed verifying the correction patient, procedure, location and laterality.  Preoperative antibiotics were provided and the LEFT upper extremity was elevated and exsanguinated with an Esmarch and tourniquet inflated to 250 mmHg.  A curvilinear incision was made over the posterior lateral aspect of the LEFT olecranon.  Skin and subcutaneous tissues were divided and careful hemostasis was obtained.  The olecranon bursa was thick and inflamed with chronic scarring.  This was identified and excised completely.  The purulence was identified and thorough debridement of all devitalized tissue was excised sharply with a scalpel. Cultures were obtained. The triceps along the radial third of  the olecranon at the insertion had eroded through however majority of triceps insertion remained.There was significant amount of bursal fluid and thickening of the bursa.  This was excised in its entirety.  Bipolar electrocautery was utilized to obtain careful hemostasis throughout the bursa. The wound was thoroughly irrigated with normal saline via low flow cysto tubing for  total of 3L.  The skin was closed  with 4-0 horizontal mattress nylon sutures.  A sterile soft bulky bandage was applied with the elbow in full extension and fiberglass splint applied. The tourniquet was deflated and the fingers were pink and warm and well-perfused at the end of the case.  The patient tolerated the procedure well.  All counts were correct x2.  The patient was awoken from anesthesia and brought to PACU for recovery in stable condition.   Matt Holmes, MD

## 2021-07-27 NOTE — Anesthesia Preprocedure Evaluation (Addendum)
Anesthesia Evaluation  Patient identified by MRN, date of birth, ID band Patient awake    Reviewed: Allergy & Precautions, NPO status , Patient's Chart, lab work & pertinent test results  History of Anesthesia Complications Negative for: history of anesthetic complications  Airway Mallampati: II  TM Distance: >3 FB Neck ROM: Full    Dental  (+) Dental Advisory Given, Edentulous Upper   Pulmonary former smoker,    breath sounds clear to auscultation       Cardiovascular hypertension, Pt. on medications (-) angina Rhythm:Regular Rate:Normal     Neuro/Psych Anxiety Depression negative neurological ROS     GI/Hepatic Neg liver ROS, GERD  Medicated and Controlled,  Endo/Other  Morbid obesity  Renal/GU Renal InsufficiencyRenal disease     Musculoskeletal  (+) Arthritis ,   Abdominal (+) + obese,   Peds  Hematology negative hematology ROS (+)   Anesthesia Other Findings H/o breast cancer  Reproductive/Obstetrics                            Anesthesia Physical Anesthesia Plan  ASA: 3  Anesthesia Plan: General   Post-op Pain Management: Tylenol PO (pre-op)*   Induction: Intravenous  PONV Risk Score and Plan: 3 and Ondansetron, Treatment may vary due to age or medical condition and Dexamethasone  Airway Management Planned: LMA  Additional Equipment: None  Intra-op Plan:   Post-operative Plan:   Informed Consent: I have reviewed the patients History and Physical, chart, labs and discussed the procedure including the risks, benefits and alternatives for the proposed anesthesia with the patient or authorized representative who has indicated his/her understanding and acceptance.     Dental advisory given  Plan Discussed with: CRNA and Surgeon  Anesthesia Plan Comments: (Pt declines supraclavicular block)       Anesthesia Quick Evaluation

## 2021-07-27 NOTE — Anesthesia Procedure Notes (Signed)
Procedure Name: LMA Insertion Date/Time: 07/27/2021 1:37 PM  Performed by: Rogers Blocker, CRNAPre-anesthesia Checklist: Patient identified, Emergency Drugs available, Suction available and Patient being monitored Patient Re-evaluated:Patient Re-evaluated prior to induction Oxygen Delivery Method: Circle System Utilized Preoxygenation: Pre-oxygenation with 100% oxygen Induction Type: IV induction Ventilation: Mask ventilation without difficulty LMA: LMA inserted LMA Size: 4.0 Number of attempts: 1 Placement Confirmation: positive ETCO2 Tube secured with: Tape Dental Injury: Teeth and Oropharynx as per pre-operative assessment

## 2021-07-27 NOTE — Discharge Instructions (Addendum)
  Orthopaedic Hand Surgery Discharge Instructions  WEIGHT BEARING STATUS: Non weight bearing on operative extremity  DRESSING CARE: Please keep your dressing/splint/cast clean and dry until your follow-up appointment. You may shower by placing a waterproof covering over your dressing/splint/cast. Contact your surgeon if your splint/cast gets wet. It will need to be changed to prevent skin breakdown.  PAIN CONTROL: First line medications for post operative pain control are Tylenol (acetaminophen) and Motrin (ibuprofen) if you are able to take these medications. If you have been prescribed a medication these can be taken as breakthrough pain medications. Please note that some narcotic pain medication has acetaminophen added and you should never consume more than 4,'000mg'$  of acetaminophen in 24-hour period. Please note that if you are given Toradol (ketorolac) you should not take similar medications such as ibuprofen or naproxen.  DISCHARGE MEDICATIONS: If you have been prescribed medication it was sent electronically to your pharmacy. No changes have been made to your home medications.  ICE/ELEVATION: Ice and elevate your injured extremity as needed. Avoid direct contact of ice with skin.   BANDAGE FEELS TOO TIGHT: If your bandage feels too tight, first make sure you are elevating your fingers as much as possible. The outer layer of the bandage can be unwrapped and reapplied more loosely. If no improvement, you may carefully cut the inner layer longitudinally until the pressure has resolved and then rewrap the outer layer. If you are not comfortable with these instructions, please call the office and the bandage can be changed for you.   FOLLOW UP: You will be called after surgery with an appointment date and time, however if you have not received a phone call within 3 days, please call during regular office hours at [phone] to schedule a post operative appointment.  Please Seek Medical Attention  if: Call MD for: pain or pressure in chest, jaw, arm, back, neck  Call MD for: temperature greater than 101 F for more than 24 hrs Call MD for: difficulty breathing Call MD for: incision redness, bleeding, drainage  Call MD for: palpitations or feeling that the heart is racing  Call MD for: increased swelling in arm, leg, ankle, or abdomen  Call MD for: lightheadedness, dizziness, fainting Call 911 or go to ER for any medical emergency if you are not able to get in touch with your doctor   J. Sable Feil, MD Orthopaedic Hand Surgeon [facility] Office number: [phone] [address], [place], [place] [address]    No acetaminophen/Tylenol until after 6:19pm today if needed for pain.    Post Anesthesia Home Care Instructions  Activity: Get plenty of rest for the remainder of the day. A responsible individual must stay with you for 24 hours following the procedure.  For the next 24 hours, DO NOT: -Drive a car -Paediatric nurse -Drink alcoholic beverages -Take any medication unless instructed by your physician -Make any legal decisions or sign important papers.  Meals: Start with liquid foods such as gelatin or soup. Progress to regular foods as tolerated. Avoid greasy, spicy, heavy foods. If nausea and/or vomiting occur, drink only clear liquids until the nausea and/or vomiting subsides. Call your physician if vomiting continues.  Special Instructions/Symptoms: Your throat may feel dry or sore from the anesthesia or the breathing tube placed in your throat during surgery. If this causes discomfort, gargle with warm salt water. The discomfort should disappear within 24 hours.

## 2021-07-27 NOTE — Transfer of Care (Signed)
Immediate Anesthesia Transfer of Care Note  Patient: [name]  Procedure(s) Performed: left elbow olecranon bursectomy (Left: Elbow)  Patient Location: PACU  Anesthesia Type:General  Level of Consciousness: awake, alert , oriented and patient cooperative  Airway & Oxygen Therapy: Patient Spontanous Breathing  Post-op Assessment: Report given to RN and Post -op Vital signs reviewed and stable  Post vital signs: Reviewed and stable  Last Vitals:  Vitals Value Taken Time  BP 166/84 07/27/21 1440  Temp    Pulse 71 07/27/21 1443  Resp 13 07/27/21 1443  SpO2 96 % 07/27/21 1443  Vitals shown include unvalidated device data.  Last Pain:  Vitals:   07/27/21 1211  TempSrc: Oral  PainSc: 9       Patients Stated Pain Goal: 8 (78/93/81 0175)  Complications: No notable events documented.

## 2021-07-28 ENCOUNTER — Encounter (HOSPITAL_BASED_OUTPATIENT_CLINIC_OR_DEPARTMENT_OTHER): Payer: Self-pay | Admitting: Orthopedic Surgery

## 2021-08-01 LAB — AEROBIC/ANAEROBIC CULTURE W GRAM STAIN (SURGICAL/DEEP WOUND)

## 2021-08-07 ENCOUNTER — Other Ambulatory Visit: Payer: Self-pay | Admitting: Nurse Practitioner

## 2021-08-26 IMAGING — MG DIGITAL DIAGNOSTIC BILAT W/ TOMO W/ CAD
6 of 11 series · 6 of 31 positions shown · non-contrast
Comparison: Previous exams.

CLINICAL DATA: 70-year-old female with history of right breast
cancer post lumpectomy 10/01/2019 followed by chemotherapy and
radiation therapy completing 04/28/2020. Patient currently takes
anastrozole. She reports a 3 week history of right breast swelling
and tenderness. She did receive a course of antibiotics which she
has finished approximately 5 days ago. Her pain has since resolved
although she reports persistent swelling involving the right breast.
No palpable areas of concern.

EXAM:
DIGITAL DIAGNOSTIC BILATERAL MAMMOGRAM WITH TOMOSYNTHESIS AND CAD;
ULTRASOUND RIGHT BREAST LIMITED
TECHNIQUE: Bilateral digital diagnostic mammography and breast tomosynthesis
was performed. The images were evaluated with computer-aided
detection.; Targeted ultrasound examination of the right breast was
performed

[R CC]
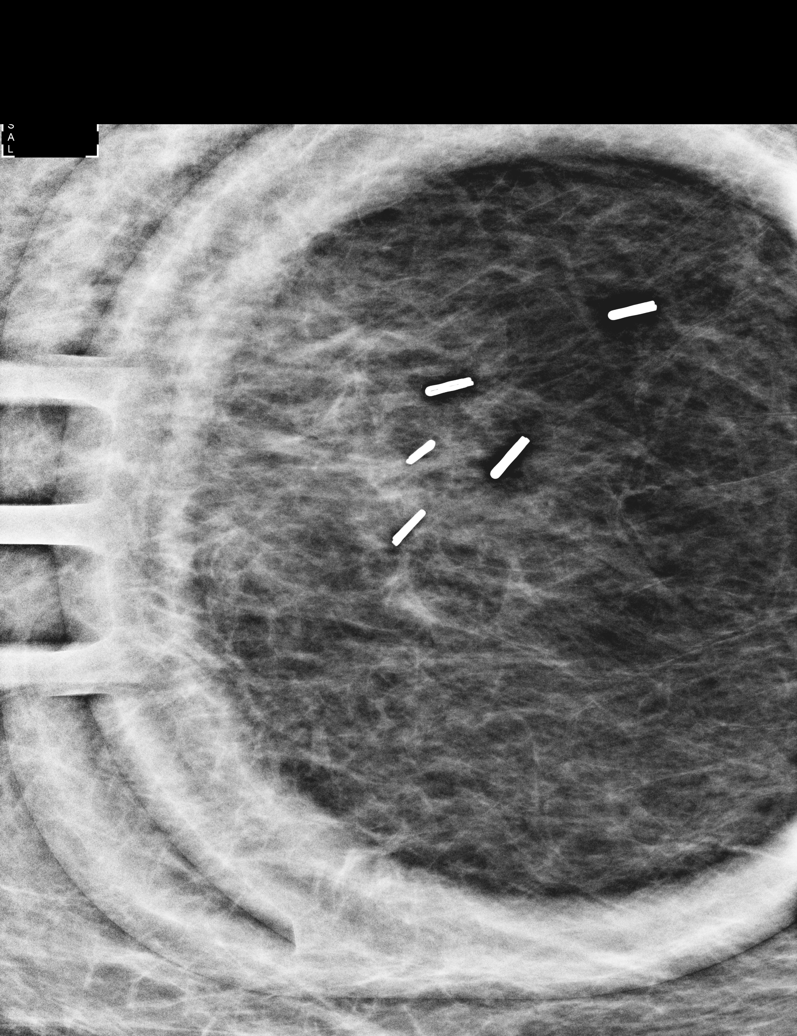

[L MLO synth-2D (1 of 2)]
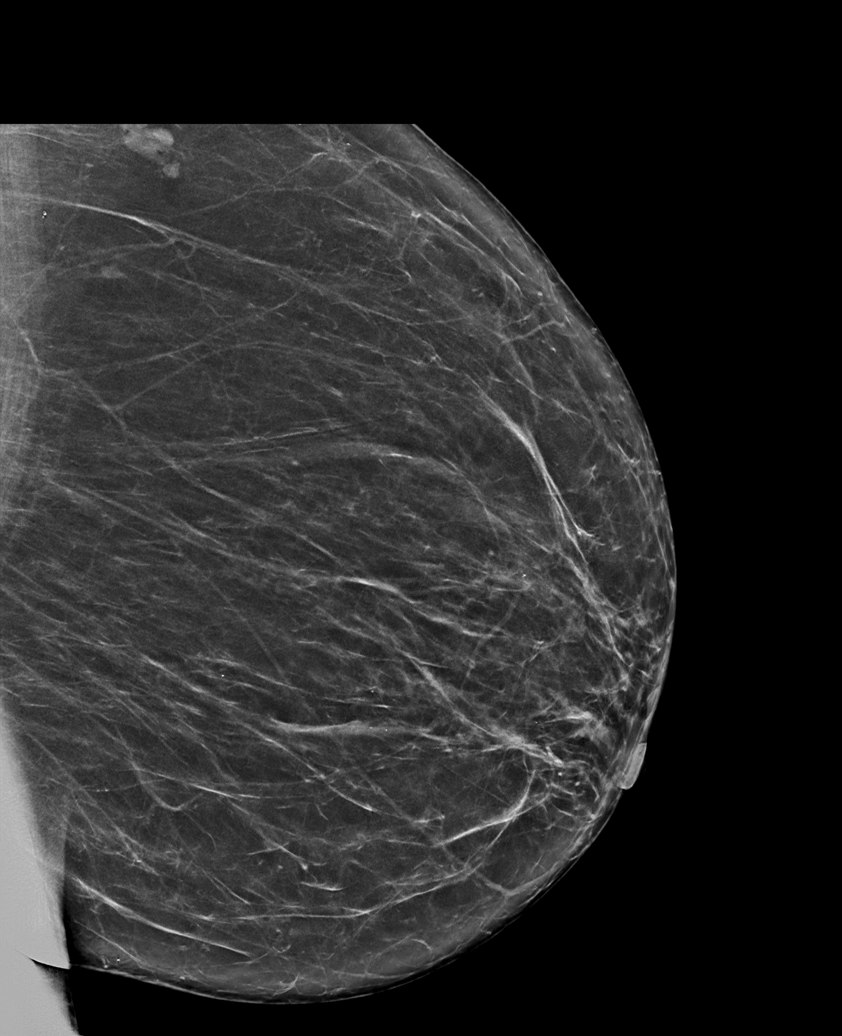

[R MLO synth-2D]
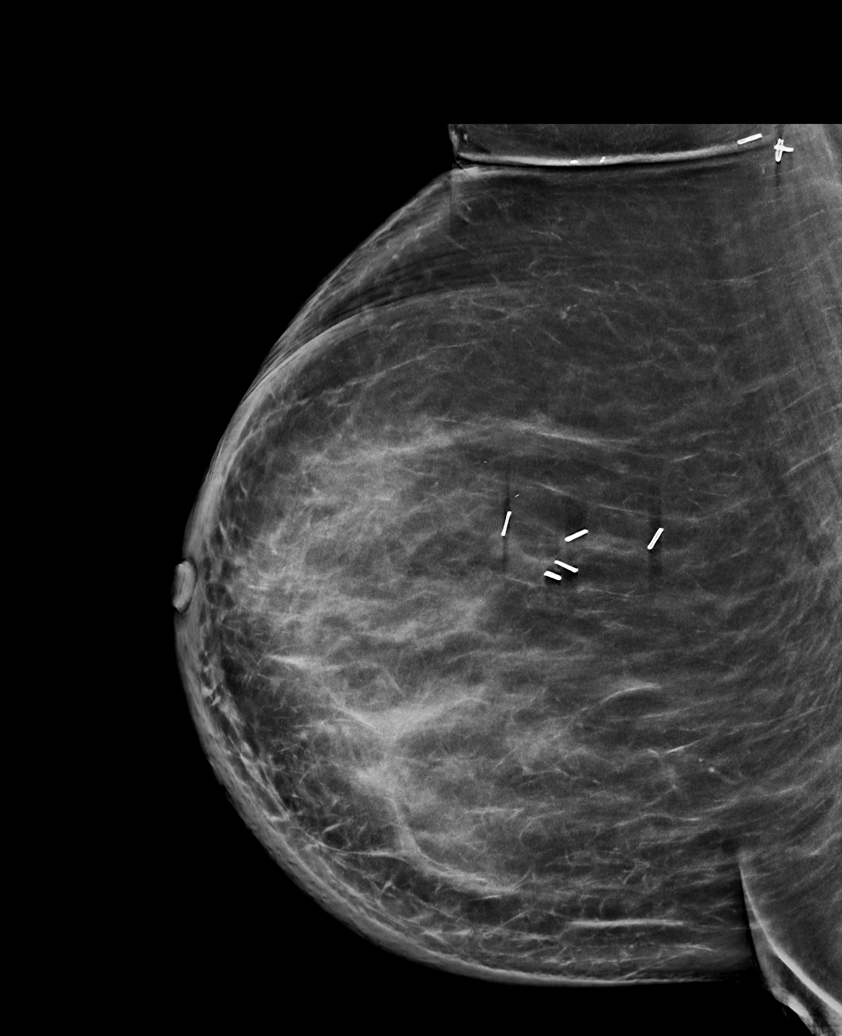

[R CC synth-2D]
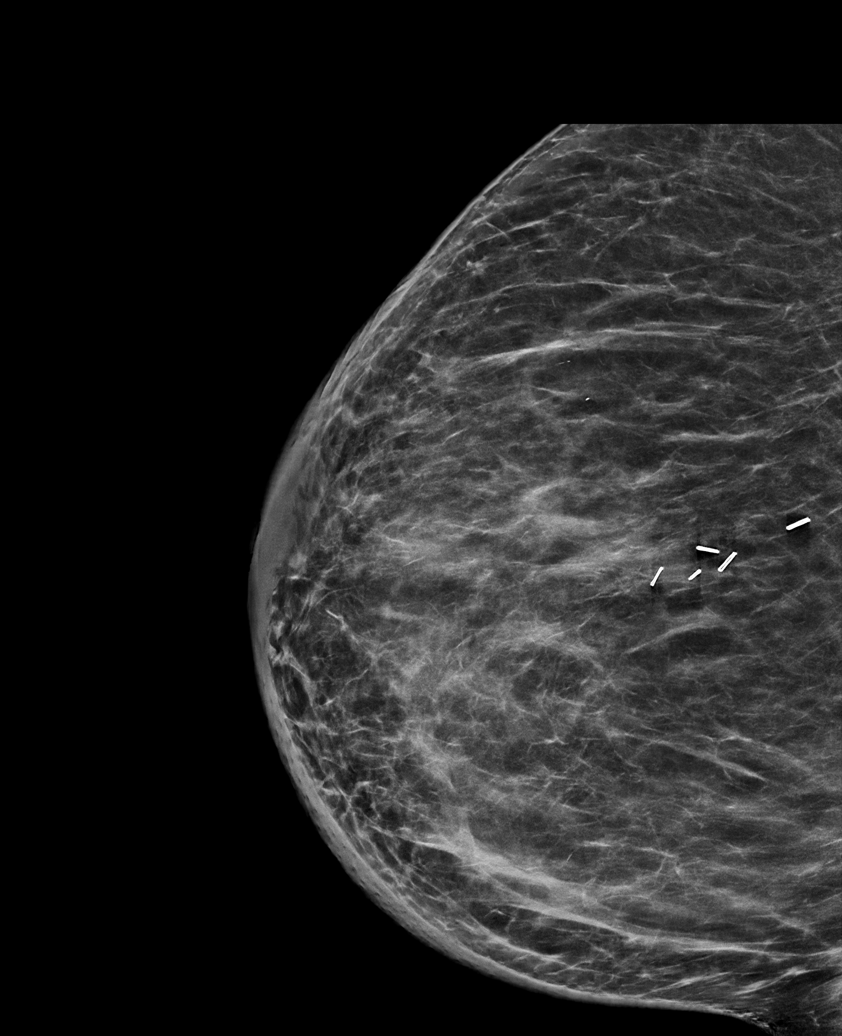

[L MLO synth-2D (2 of 2)]
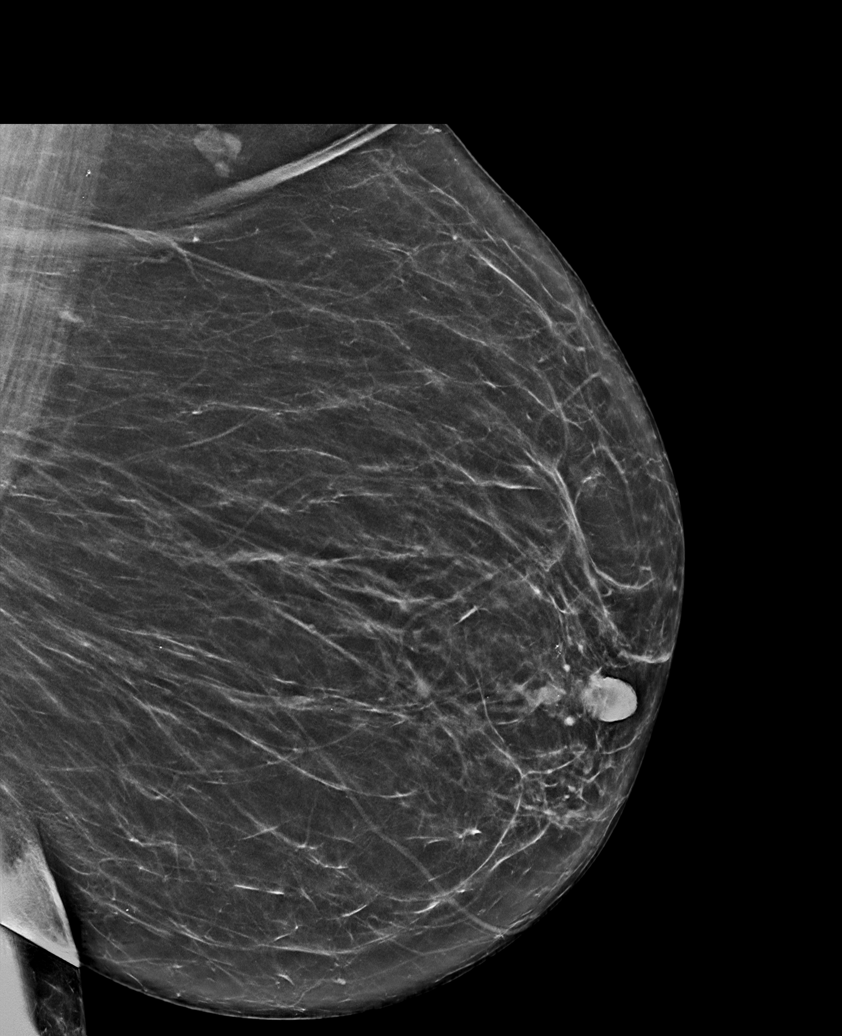

[L CC synth-2D]
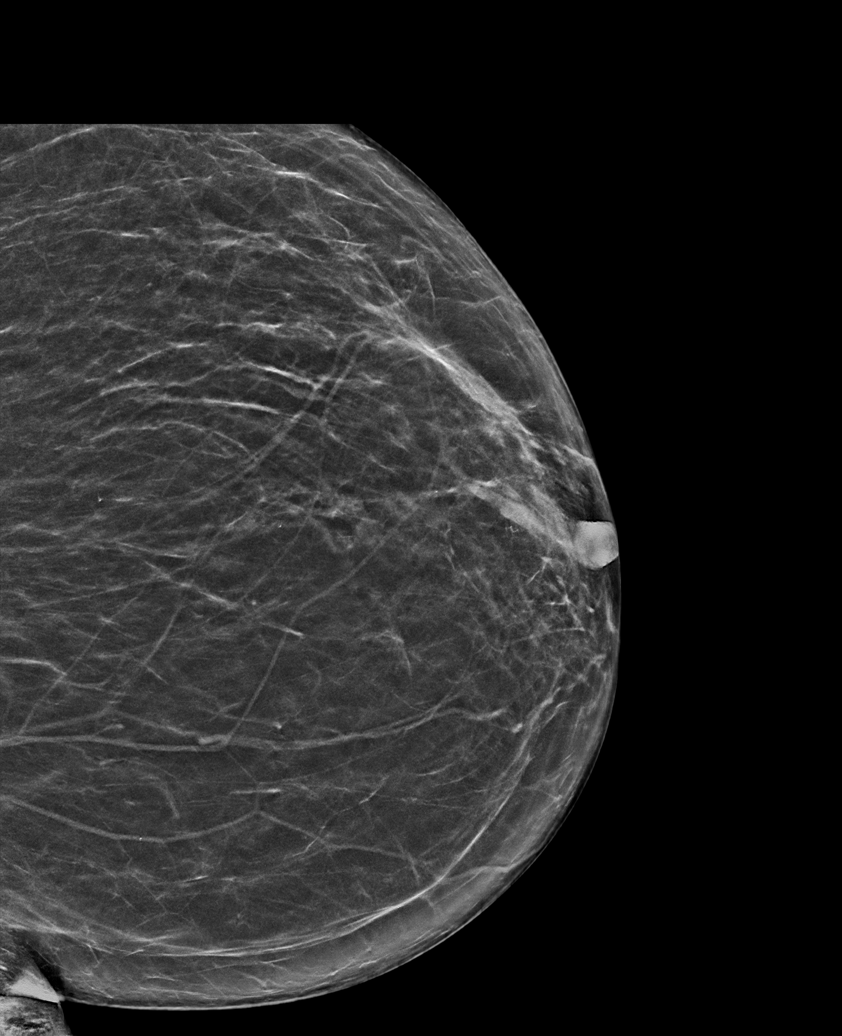

[6 of 31 positions shown; findings below may reference images not displayed]

ACR Breast Density Category b: There are scattered areas of
fibroglandular density.
FINDINGS: No suspicious masses or calcifications are seen in either breast.
Lumpectomy changes are identified in the central posterior right
breast. There is increased density of the right breast with
associated skin and trabecular thickening most consistent with
history of radiation therapy. Spot compression magnification CC view
of the right breast lumpectomy site was performed. There is no
mammographic evidence of locally recurrent malignancy.

Physical examination reveals mild enlargement of the right breast.
Postsurgical scarring is minimally visualized. The right breast is
soft to palpation without underlying palpable mass. There is no
erythema. The patient is nontender.

Targeted ultrasound of the upper-outer and central right breast was
performed. There is generalized skin thickening and subcutaneous
edema, however no underlying fluid collections or masses identified.
IMPRESSION: 1. Expected lumpectomy changes in the right breast with no drainable
collections or masses identified.

2.  No mammographic evidence of malignancy in either breast.

RECOMMENDATION:
1. Recommend further management of patient's resolving right breast
cellulitis be based on clinical assessment. She has been instructed
to seek further care if she experiences worsening pain, erythema,
palpable abnormalities or other signs of infection in the right
breast.

2.  Diagnostic mammogram is suggested in 1 year. (Code:[id number])

I have discussed the findings and recommendations with the patient.
If applicable, a reminder letter will be sent to the patient
regarding the next appointment.

BI-RADS CATEGORY  2: Benign.

## 2021-09-06 ENCOUNTER — Other Ambulatory Visit: Payer: Self-pay | Admitting: *Deleted

## 2021-09-06 DIAGNOSIS — Z9889 Other specified postprocedural states: Secondary | ICD-10-CM

## 2021-09-09 ENCOUNTER — Other Ambulatory Visit: Payer: Self-pay | Admitting: *Deleted

## 2021-09-09 DIAGNOSIS — Z170 Estrogen receptor positive status [ER+]: Secondary | ICD-10-CM

## 2021-09-09 NOTE — Progress Notes (Signed)
Received call from pt with complaint of right breast lymphedema.  Pt denies any redness, warmth, drainage, or recent injury at this time.  Per MD pt needing referral to PT for evaluation and tx of right breast lymphedema.  Referral placed.

## 2021-10-20 NOTE — Therapy (Signed)
OUTPATIENT PHYSICAL THERAPY ONCOLOGY EVALUATION  Patient Name: [name] MRN: [id number] DOB:03-26-1950, 71 y.o., female Today's Date: 10/21/2021   PT End of Session - 10/21/21 1603     Visit Number 1    Number of Visits 12    Date for PT Re-Evaluation 12/02/21    Authorization Type [employer] - update auth when approved    PT Start Time 1604    PT Stop Time 1655    PT Time Calculation (min) 40 min             Past Medical History:  Diagnosis Date   AKI (acute kidney injury) ([place]) 2016   all issues resolved yrs ago per pt on 07-22-2021   Anxiety    Arthritis    Cancer ([place]) 09/2019   right breast IDC   Depression    GERD (gastroesophageal reflux disease)    Hernia    umbilical   Hyperlipidemia    Hypertension    Olecranon bursitis, left elbow    Past Surgical History:  Procedure Laterality Date   ABDOMINAL HYSTERECTOMY     Partial 30-40 yrs ago   BACK SURGERY     4 Bilateral Titanium Screws near L4 and L5   BREAST LUMPECTOMY WITH RADIOACTIVE SEED AND SENTINEL LYMPH NODE BIOPSY Right 10/01/2019   Procedure: RIGHT BREAST LUMPECTOMY WITH RADIOACTIVE SEED AND SENTINEL LYMPH NODE BIOPSY;  Surgeon: Erroll Luna, MD;  Location: [place];  Service: General;  Laterality: Right;  [place]     2001 or 2002   EXCISION / CURETTAGE BONE CYST PHALANGES OF FOOT Right    HEEL SPUR EXCISION Left    keloids removed from ears     yrs ago   OLECRANON BURSECTOMY Left 07/27/2021   Procedure: left elbow olecranon bursectomy;  Surgeon: Orene Desanctis, MD;  Location: [facility];  Service: Orthopedics;  Laterality: Left;   PORT-A-CATH REMOVAL Right 07/15/2020   Procedure: REMOVAL PORT-A-CATH;  Surgeon: Erroll Luna, MD;  Location: [facility];  Service: General;  Laterality: Right;   PORTACATH PLACEMENT Right 11/12/2019   Procedure: INSERTION PORT-A-CATH WITH ULTRASOUND GUIDANCE, ACCESSED;  Surgeon: Erroll Luna, MD;  Location: [facility];  Service: General;  Laterality: Right;   [place]  09/26/2012   Dr Rolena Infante   SPINAL CORD STIMULATOR INSERTION N/A 09/26/2012   Procedure: SPINAL CORD STIMULATOR PLACEMENT;  Surgeon: Melina Schools, MD;  Location: [facility];  Service: Orthopedics;  Laterality: N/A;   SPINAL CORD STIMULATOR REMOVAL     2013 or 2014, leads left in per patient   TONSILLECTOMY     age 53   Patient Active Problem List   Diagnosis Date Noted   Port-A-Cath in place 11/21/2019   Malignant neoplasm of upper-outer quadrant of right breast in female, estrogen receptor positive ([place]) 09/17/2019   AKI (acute kidney injury) ([place]) 08/03/2014   Nausea with vomiting 08/03/2014   Dehydration 08/03/2014   Bilateral flank pain 05/09/2012   HTN (hypertension) 05/09/2012   GERD (gastroesophageal reflux disease) 05/09/2012   Adjustment disorder with mixed anxiety and depressed mood 05/09/2012    PCP: Burr Medico NP  REFERRING PROVIDER: Nicholas Lose MD  REFERRING DIAG: Right breast Cancer  THERAPY DIAG:  Malignant neoplasm of upper-outer quadrant of right breast in female, estrogen receptor positive ([place])  Localized edema  Abnormal posture  Breast swelling  ONSET DATE: 08/2020 with exacerbation  Rationale for Evaluation and Treatment Rehabilitation  SUBJECTIVE                                                                                                                                                                                           SUBJECTIVE STATEMENT: Sometimes my breast looks the same as the other side and sometimes it seems swollen.  Increased mild redness that is intermittent. The  area where the port was is still sore, and under my arm where the LN's were removed is still sore. My bra leaves an impression on the right side.    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  other health history of HTN, anxiety, hysterectomy, 6-8 lumbar surgeries over the years including,spinal stimulator that was removed and pt reports only the leads are in place now. Has OA all over. Can't hold arms up long  PAIN:  Are you having pain? Yes NPRS scale: 7/10 elbow, 9/10 LB Pain location: elbow and back Pain orientation: Left elbow, bilateral LB PAIN TYPE: sharp elbow, crepitus in LB Pain description: constant and stabbing , elbow/LB Aggravating factors: back: movement, sitting, standing, Elbow:leaning on elbow, at rest Relieving factors: nothing for LB, or elbow  PRECAUTIONS: HTN, anxiety, hysterectomy, 6-8 lumbar surgeries over the years including,spinal stimulator that was removed and pt reports only the leads are in place now. Has OA all over, righ UE lymphedema risk   WEIGHT BEARING RESTRICTIONS No but has difficulty walking and uses rollator  FALLS:  Has patient fallen in last 6 months? No  LIVING ENVIRONMENT: Lives with: lives with their family Lives in: House/apartment Stairs: Yes; Internal: 14 steps; on left going up Has following equipment at home: Walker - 2 wheeled and bed side commode  OCCUPATION: Retired, on disability  LEISURE: play on phone, TV  HAND DOMINANCE : right   PRIOR LEVEL OF FUNCTION: Independent with household mobility with device  PATIENT GOALS Decrease Right breast swelling   OBJECTIVE  COGNITION:  Overall cognitive status:   Within functional limits for tasks assessed PALPATION: Mild tenderness and pitting at inferior breast, tender where porta cath has keloid and under arm keloid  OBSERVATIONS / OTHER ASSESSMENTS:  Visibly increased swelling in right breast with hyperpigmentation noted from prior radiation.  Pt with enlarged pores, and mild pitting at inferior breast, keloid scars noted from biopsy and portacatch        SENSATION:  Light touch: Appears intact    POSTURE: forward  head, rounded shoulders increased lumbar lordosis, increased thoracic kyphosis  UPPER EXTREMITY AROM/PROM:  A/PROM RIGHT   eval  Shoulder extension   Shoulder flexion 140  Shoulder abduction 145  Shoulder internal rotation   Shoulder external rotation     (Blank rows = not tested)  A/PROM LEFT   eval  Shoulder extension   Shoulder flexion 142 sitting  Shoulder abduction 140 sitting  Shoulder internal rotation   Shoulder external rotation     (Blank rows = not tested)   CERVICAL AROM: All within normal limits:      UPPER EXTREMITY STRENGTH: NT due to OA  LYMPHEDEMA ASSESSMENTS:   SURGERY TYPE/DATE: Right lumpectomy 10/01/2019  NUMBER OF LYMPH NODES REMOVED: 7  CHEMOTHERAPY: yes  RADIATION:yes, ended 04/28/2020  HORMONE TREATMENT: yes  INFECTIONS: yes , 09/2020  LYMPHEDEMA ASSESSMENTS:    Chest measurement under arms 104 Widest part of chest above right nipple 124.2  LANDMARK RIGHT  eval  10 cm proximal to olecranon process 30.7  Olecranon process 25.6  10 cm proximal to ulnar styloid process 22.3  Just proximal to ulnar styloid process 15.6  Across hand at thumb web space 19.3  At base of 2nd digit 6.3  (Blank rows = not tested)  LANDMARK LEFT  eval  10 cm proximal to olecranon process 31.4  Olecranon process 27 over bandage from prior sx  10 cm proximal to ulnar styloid process 22.9  Just proximal to ulnar styloid process 16.0  Across hand at thumb web space 19.0  At base of 2nd digit 6.4  (Blank rows = not tested)  Assistive device utilized: Environmental consultant - 2 wheeled Level of assistance: Modified independence Comments:      Breast complaints survey 33   TODAY'S TREATMENT  Advised pt to begin wearing her compression bra daily and to continue MLD to the left breast. Will send demographics again for Flexitouch today.  PATIENT EDUCATION:  Education details: wear compression bra, continue MLD, approval to send demographics for  Flexitouch Person educated: Patient Education method: Explanation Education comprehension: verbalized understanding   HOME EXERCISE PROGRAM: None given  ASSESSMENT:  CLINICAL IMPRESSION: Patient is a 71 y.o. female who was seen today for physical therapy evaluation and treatment for Right breast lymphedema. Pt is with  significant right breast swelling/heaviness and right breast hyperpigmentation with mild redness remaining since radiation with enlarged pores and mild pitting edema at inferior breast . She will benefit from skilled therapy to decrease swelling and heaviness and to relieve discomfort.   OBJECTIVE IMPAIRMENTS decreased knowledge of condition, increased edema, and pain.   ACTIVITY LIMITATIONS carrying, sitting, standing, and bed mobility  PARTICIPATION LIMITATIONS: meal prep, laundry, and driving  PERSONAL FACTORS 3+ comorbidities: Right breast Cancer s/p surgery and radiation, severe LBP  are also affecting patient's functional outcome.   REHAB POTENTIAL: Good  CLINICAL DECISION MAKING: Stable/uncomplicated  EVALUATION COMPLEXITY: Low  GOALS: Goals reviewed with patient? Yes 11/11/2021 SHORT TERM GOALS: Target date: 11/11/2021    Pt will report decreased right breast discomfort and heaviness by 25% Baseline: Goal status: INITIAL    LONG TERM GOALS: Target date: 12/02/2021    Pt will be compliant with use of compression bra and foam as needed to decrease swelling Baseline:  Goal status: INITIAL  2.  Pt will report decreased right breast heaviness /discomfort by 50% or greater Baseline:  Goal status: INITIAL  3.  Pts breast complaints survery will decrease to 16 or less to demonstrate improvements Baseline:  Goal status: INITIAL  4.  Pt will be approved for Flexitouch to assist with right breast swelling Baseline:  Goal status:  INITIAL    PLAN: PT FREQUENCY: 2x/week  PT DURATION: 6 weeks  PLANNED INTERVENTIONS: Therapeutic exercises,  Patient/Family education, Self Care, Manual lymph drainage, scar mobilization, Vasopneumatic device, Manual therapy, and Re-evaluation  PLAN FOR NEXT SESSION: initiate MLD and review with pt. Consider foam in bra   Claris Pong, PT 10/21/2021, 5:11 PM

## 2021-10-21 ENCOUNTER — Ambulatory Visit: Payer: Medicare HMO | Attending: Hematology and Oncology

## 2021-10-21 DIAGNOSIS — N63 Unspecified lump in unspecified breast: Secondary | ICD-10-CM | POA: Insufficient documentation

## 2021-10-21 DIAGNOSIS — R600 Localized edema: Secondary | ICD-10-CM | POA: Insufficient documentation

## 2021-10-21 DIAGNOSIS — Z170 Estrogen receptor positive status [ER+]: Secondary | ICD-10-CM | POA: Insufficient documentation

## 2021-10-21 DIAGNOSIS — C50411 Malignant neoplasm of upper-outer quadrant of right female breast: Secondary | ICD-10-CM | POA: Diagnosis present

## 2021-10-21 DIAGNOSIS — R293 Abnormal posture: Secondary | ICD-10-CM | POA: Diagnosis present

## 2021-10-25 NOTE — Progress Notes (Signed)
Surgery orders requested via Epic inbox. °

## 2021-10-26 NOTE — Patient Instructions (Signed)
DUE TO COVID-19 ONLY TWO VISITORS  (aged 71 and older)  ARE ALLOWED TO COME WITH YOU AND STAY IN THE WAITING ROOM ONLY DURING PRE OP AND PROCEDURE.   **NO VISITORS ARE ALLOWED IN THE SHORT STAY AREA OR RECOVERY ROOM!!**  IF YOU WILL BE ADMITTED INTO THE HOSPITAL YOU ARE ALLOWED ONLY FOUR SUPPORT PEOPLE DURING VISITATION HOURS ONLY (7 AM -8PM)   The support person(s) must pass our screening, gel in and out, and wear a mask at all times, including in the patient's room. Patients must also wear a mask when staff or their support person are in the room. Visitors GUEST BADGE MUST BE WORN VISIBLY  One adult visitor may remain with you overnight and MUST be in the room by 8 P.M.     Your procedure is scheduled on: 10/29/21   Report to [facility] Main Entrance    Report to admitting at : 11:35 AM   Call this number if you have problems the morning of surgery [phone]   Do not eat food :After Midnight.   After Midnight you may have the following liquids until : 10:45 AM DAY OF SURGERY  Water Black Coffee (sugar ok, NO MILK/CREAM OR CREAMERS)  Tea (sugar ok, NO MILK/CREAM OR CREAMERS) regular and decaf                             Plain Jell-O (NO RED)                                           Fruit ices (not with fruit pulp, NO RED)                                     Popsicles (NO RED)                                                                  Juice: apple, WHITE grape, WHITE cranberry Sports drinks like Gatorade (NO RED)    Oral Hygiene is also important to reduce your risk of infection.                                    Remember - BRUSH YOUR TEETH THE MORNING OF SURGERY WITH YOUR REGULAR TOOTHPASTE   Do NOT smoke after Midnight   Take these medicines the morning of surgery with A SIP OF WATER: sertraline,anastrozole,omeprazole,doxycycline.Alprazolam as needed.  DO NOT TAKE ANY ORAL DIABETIC MEDICATIONS DAY OF YOUR SURGERY  Bring CPAP mask and tubing day of  surgery.                              You may not have any metal on your body including hair pins, jewelry, and body piercing             Do not wear make-up, lotions, powders, perfumes/cologne, or deodorant  Do not wear nail polish  including gel and S&S, artificial/acrylic nails, or any other type of covering on natural nails including finger and toenails. If you have artificial nails, gel coating, etc. that needs to be removed by a nail salon please have this removed prior to surgery or surgery may need to be canceled/ delayed if the surgeon/ anesthesia feels like they are unable to be safely monitored.   Do not shave  48 hours prior to surgery.    Do not bring valuables to the hospital. [place].   Contacts, dentures or bridgework may not be worn into surgery.   Bring small overnight bag day of surgery.   DO NOT [place]. PHARMACY WILL DISPENSE MEDICATIONS LISTED ON YOUR MEDICATION LIST TO YOU DURING YOUR ADMISSION [place]!    Patients discharged on the day of surgery will not be allowed to drive home.  Someone NEEDS to stay with you for the first 24 hours after anesthesia.   Special Instructions: Bring a copy of your healthcare power of attorney and living will documents         the day of surgery if you haven't scanned them before.              Please read over the following fact sheets you were given: IF YOU HAVE QUESTIONS ABOUT YOUR PRE-OP INSTRUCTIONS PLEASE CALL [phone]     [facility] - Preparing for Surgery Before surgery, you can play an important role.  Because skin is not sterile, your skin needs to be as free of germs as possible.  You can reduce the number of germs on your skin by washing with CHG (chlorahexidine gluconate) soap before surgery.  CHG is an antiseptic cleaner which kills germs and bonds with the skin to continue killing germs even after washing. Please DO NOT  use if you have an allergy to CHG or antibacterial soaps.  If your skin becomes reddened/irritated stop using the CHG and inform your nurse when you arrive at Short Stay. Do not shave (including legs and underarms) for at least 48 hours prior to the first CHG shower.  You may shave your face/neck. Please follow these instructions carefully:  1.  Shower with CHG Soap the night before surgery and the  morning of Surgery.  2.  If you choose to wash your hair, wash your hair first as usual with your  normal  shampoo.  3.  After you shampoo, rinse your hair and body thoroughly to remove the  shampoo.                           4.  Use CHG as you would any other liquid soap.  You can apply chg directly  to the skin and wash                       Gently with a scrungie or clean washcloth.  5.  Apply the CHG Soap to your body ONLY FROM THE NECK DOWN.   Do not use on face/ open                           Wound or open sores. Avoid contact with eyes, ears mouth and genitals (private parts).  Wash face,  Genitals (private parts) with your normal soap.             6.  Wash thoroughly, paying special attention to the area where your surgery  will be performed.  7.  Thoroughly rinse your body with warm water from the neck down.  8.  DO NOT shower/wash with your normal soap after using and rinsing off  the CHG Soap.                9.  Pat yourself dry with a clean towel.            10.  Wear clean pajamas.            11.  Place clean sheets on your bed the night of your first shower and do not  sleep with pets. Day of Surgery : Do not apply any lotions/deodorants the morning of surgery.  Please wear clean clothes to the hospital/surgery center.  FAILURE TO FOLLOW THESE INSTRUCTIONS MAY RESULT IN THE CANCELLATION OF YOUR SURGERY PATIENT SIGNATURE_________________________________  NURSE  SIGNATURE__________________________________  ________________________________________________________________________

## 2021-10-27 ENCOUNTER — Ambulatory Visit: Payer: Medicare HMO | Admitting: Rehabilitation

## 2021-10-28 ENCOUNTER — Other Ambulatory Visit: Payer: Self-pay

## 2021-10-28 ENCOUNTER — Encounter (HOSPITAL_COMMUNITY)
Admission: RE | Admit: 2021-10-28 | Discharge: 2021-10-28 | Disposition: A | Discharge status: Home / Self Care | Payer: Medicare HMO | Source: Ambulatory Visit | Attending: Orthopedic Surgery | Admitting: Orthopedic Surgery

## 2021-10-28 ENCOUNTER — Encounter (HOSPITAL_COMMUNITY): Payer: Self-pay

## 2021-10-28 VITALS — BP 124/83 | HR 73 | Temp 98.4°F | Ht 63.0 in | Wt 196.0 lb

## 2021-10-28 DIAGNOSIS — I10 Essential (primary) hypertension: Secondary | ICD-10-CM | POA: Diagnosis not present

## 2021-10-28 DIAGNOSIS — Z01812 Encounter for preprocedural laboratory examination: Secondary | ICD-10-CM | POA: Diagnosis present

## 2021-10-28 DIAGNOSIS — Z01818 Encounter for other preprocedural examination: Secondary | ICD-10-CM

## 2021-10-28 HISTORY — DX: Fatty (change of) liver, not elsewhere classified: K76.0

## 2021-10-28 LAB — CBC
HCT: 42.3 % (ref 36.0–46.0)
Hemoglobin: 14.1 g/dL (ref 12.0–15.0)
MCH: 32 pg (ref 26.0–34.0)
MCHC: 33.3 g/dL (ref 30.0–36.0)
MCV: 96.1 fL (ref 80.0–100.0)
Platelets: 188 10*3/uL (ref 150–400)
RBC: 4.4 MIL/uL (ref 3.87–5.11)
RDW: 14.2 % (ref 11.5–15.5)
WBC: 5 10*3/uL (ref 4.0–10.5)
nRBC: 0 % (ref 0.0–0.2)

## 2021-10-28 LAB — BASIC METABOLIC PANEL
Anion gap: 10 (ref 5–15)
BUN: 26 mg/dL — ABNORMAL HIGH (ref 8–23)
CO2: 23 mmol/L (ref 22–32)
Calcium: 9.6 mg/dL (ref 8.9–10.3)
Chloride: 107 mmol/L (ref 98–111)
Creatinine, Ser: 0.67 mg/dL (ref 0.44–1.00)
GFR, Estimated: >60 mL/min (ref >60)
Glucose, Bld: 101 mg/dL — ABNORMAL HIGH (ref 70–99)
Potassium: 3.2 mmol/L — ABNORMAL LOW (ref 3.5–5.1)
Sodium: 140 mmol/L (ref 135–145)

## 2021-10-28 LAB — NO BLOOD PRODUCTS

## 2021-10-28 NOTE — Progress Notes (Signed)
Pt. Refused blood products. 

## 2021-10-28 NOTE — Progress Notes (Addendum)
For Short Stay: [place] appointment date: Date of COVID positive in last 63 days:  Bowel Prep reminder:   For Anesthesia: PCP - Everardo Beals: NP Cardiologist -: N/A   Chest x-ray -  EKG - 07/27/21 Stress Test -  ECHO -  Cardiac Cath -  Pacemaker/ICD device last checked: Pacemaker orders received: Device Rep notified:  Spinal Cord Stimulator:  Sleep Study -  CPAP -   Fasting Blood Sugar -  Checks Blood Sugar _____ times a day Date and result of last Hgb A1c-  Blood Thinner Instructions: Aspirin Instructions: It's on hold. Last Dose:  Activity level: Can go up a flight of stairs and activities of daily living without stopping and without chest pain and/or shortness of breath   Able to exercise without chest pain and/or shortness of breath   Unable to go up a flight of stairs without chest pain and/or shortness of breath     Anesthesia review:   Patient denies shortness of breath, fever, cough and chest pain at PAT appointment   Patient verbalized understanding of instructions that were given to them at the PAT appointment. Patient was also instructed that they will need to review over the PAT instructions again at home before surgery.

## 2021-10-29 ENCOUNTER — Encounter (HOSPITAL_BASED_OUTPATIENT_CLINIC_OR_DEPARTMENT_OTHER): Payer: Self-pay | Admitting: Orthopedic Surgery

## 2021-10-29 ENCOUNTER — Encounter (HOSPITAL_COMMUNITY)
Admission: RE | Disposition: A | Discharge status: Other Acute Inpatient Hospital | Payer: Self-pay | Source: Home / Self Care | Attending: Orthopedic Surgery

## 2021-10-29 ENCOUNTER — Ambulatory Visit (HOSPITAL_BASED_OUTPATIENT_CLINIC_OR_DEPARTMENT_OTHER): Payer: Medicare HMO | Admitting: Certified Registered Nurse Anesthetist

## 2021-10-29 ENCOUNTER — Encounter (HOSPITAL_COMMUNITY): Payer: Self-pay | Admitting: Anesthesiology

## 2021-10-29 ENCOUNTER — Encounter (HOSPITAL_BASED_OUTPATIENT_CLINIC_OR_DEPARTMENT_OTHER)
Admission: RE | Disposition: A | Discharge status: Home / Self Care | Payer: Self-pay | Source: Home / Self Care | Attending: Orthopedic Surgery

## 2021-10-29 ENCOUNTER — Ambulatory Visit (HOSPITAL_COMMUNITY)
Admission: RE | Admit: 2021-10-29 | Discharge: 2021-10-29 | Disposition: A | Discharge status: Other Acute Inpatient Hospital | Payer: Medicare HMO | Attending: Orthopedic Surgery | Admitting: Orthopedic Surgery

## 2021-10-29 ENCOUNTER — Other Ambulatory Visit: Payer: Self-pay

## 2021-10-29 ENCOUNTER — Encounter (HOSPITAL_COMMUNITY): Payer: Self-pay | Admitting: Orthopedic Surgery

## 2021-10-29 ENCOUNTER — Ambulatory Visit (HOSPITAL_BASED_OUTPATIENT_CLINIC_OR_DEPARTMENT_OTHER)
Admission: RE | Admit: 2021-10-29 | Discharge: 2021-10-29 | Disposition: A | Discharge status: Home / Self Care | Payer: Medicare HMO | Attending: Orthopedic Surgery | Admitting: Orthopedic Surgery

## 2021-10-29 DIAGNOSIS — M7022 Olecranon bursitis, left elbow: Secondary | ICD-10-CM

## 2021-10-29 DIAGNOSIS — Z01818 Encounter for other preprocedural examination: Secondary | ICD-10-CM

## 2021-10-29 DIAGNOSIS — K219 Gastro-esophageal reflux disease without esophagitis: Secondary | ICD-10-CM | POA: Diagnosis not present

## 2021-10-29 DIAGNOSIS — I10 Essential (primary) hypertension: Secondary | ICD-10-CM | POA: Insufficient documentation

## 2021-10-29 DIAGNOSIS — Z87891 Personal history of nicotine dependence: Secondary | ICD-10-CM | POA: Insufficient documentation

## 2021-10-29 DIAGNOSIS — M009 Pyogenic arthritis, unspecified: Secondary | ICD-10-CM | POA: Diagnosis not present

## 2021-10-29 HISTORY — PX: OLECRANON BURSECTOMY: SHX2097

## 2021-10-29 SURGERY — IRRIGATION AND DEBRIDEMENT ELBOW
Anesthesia: Monitor Anesthesia Care

## 2021-10-29 SURGERY — BURSECTOMY, ELBOW
Anesthesia: General | Site: Elbow | Laterality: Left

## 2021-10-29 MED ORDER — FENTANYL CITRATE (PF) 100 MCG/2ML IJ SOLN
INTRAMUSCULAR | Status: DC | PRN
  Administered 2021-10-29 (×2): 50 ug via INTRAVENOUS

## 2021-10-29 MED ORDER — ONDANSETRON HCL 4 MG/2ML IJ SOLN: 4.0000 mg | Freq: Once | INTRAMUSCULAR | Status: DC | PRN

## 2021-10-29 MED ORDER — DEXAMETHASONE SODIUM PHOSPHATE 10 MG/ML IJ SOLN
INTRAMUSCULAR | Status: DC | PRN
  Administered 2021-10-29: 10 mg via INTRAVENOUS

## 2021-10-29 MED ORDER — LACTATED RINGERS IV SOLN: INTRAVENOUS | Status: DC

## 2021-10-29 MED ORDER — CEFAZOLIN SODIUM-DEXTROSE 2-4 GM/100ML-% IV SOLN
INTRAVENOUS | Status: AC
  Filled 2021-10-29: qty 100

## 2021-10-29 MED ORDER — DIPHENHYDRAMINE HCL 50 MG/ML IJ SOLN
INTRAMUSCULAR | Status: DC | PRN
  Administered 2021-10-29: 12.5 mg via INTRAVENOUS

## 2021-10-29 MED ORDER — HYDROCODONE-ACETAMINOPHEN 5-325 MG PO TABS: 1.0000 | ORAL_TABLET | Freq: Four times a day (QID) | ORAL | 0 refills | Status: AC | PRN

## 2021-10-29 MED ORDER — ONDANSETRON HCL 4 MG/2ML IJ SOLN
INTRAMUSCULAR | Status: AC
  Filled 2021-10-29: qty 6

## 2021-10-29 MED ORDER — ORAL CARE MOUTH RINSE: 15.0000 mL | Freq: Once | OROMUCOSAL | Status: AC

## 2021-10-29 MED ORDER — FENTANYL CITRATE (PF) 100 MCG/2ML IJ SOLN
INTRAMUSCULAR | Status: AC
  Filled 2021-10-29: qty 2

## 2021-10-29 MED ORDER — DOXYCYCLINE HYCLATE 100 MG PO CAPS: 100.0000 mg | ORAL_CAPSULE | Freq: Two times a day (BID) | ORAL | 0 refills | Status: AC

## 2021-10-29 MED ORDER — OXYCODONE HCL 5 MG PO TABS: 5.0000 mg | ORAL_TABLET | Freq: Once | ORAL | Status: DC | PRN

## 2021-10-29 MED ORDER — OXYCODONE HCL 5 MG/5ML PO SOLN: 5.0000 mg | Freq: Once | ORAL | Status: DC | PRN

## 2021-10-29 MED ORDER — LIDOCAINE 2% (20 MG/ML) 5 ML SYRINGE
INTRAMUSCULAR | Status: DC | PRN
  Administered 2021-10-29: 100 mg via INTRAVENOUS

## 2021-10-29 MED ORDER — PROPOFOL 10 MG/ML IV BOLUS
INTRAVENOUS | Status: DC | PRN
  Administered 2021-10-29: 50 mg via INTRAVENOUS
  Administered 2021-10-29: 150 mg via INTRAVENOUS

## 2021-10-29 MED ORDER — KETOROLAC TROMETHAMINE 30 MG/ML IJ SOLN: 30.0000 mg | Freq: Once | INTRAMUSCULAR | Status: DC | PRN

## 2021-10-29 MED ORDER — CEFAZOLIN SODIUM-DEXTROSE 2-3 GM-%(50ML) IV SOLR
INTRAVENOUS | Status: DC | PRN
  Administered 2021-10-29: 2 g via INTRAVENOUS

## 2021-10-29 MED ORDER — PROPOFOL 1000 MG/100ML IV EMUL
INTRAVENOUS | Status: AC
  Filled 2021-10-29: qty 100

## 2021-10-29 MED ORDER — FENTANYL CITRATE (PF) 100 MCG/2ML IJ SOLN: 25.0000 ug | INTRAMUSCULAR | Status: DC | PRN

## 2021-10-29 MED ORDER — BACITRACIN ZINC 500 UNIT/GM EX OINT
TOPICAL_OINTMENT | CUTANEOUS | Status: DC | PRN
  Administered 2021-10-29: 1 via TOPICAL

## 2021-10-29 MED ORDER — LIDOCAINE HCL 1 % IJ SOLN
INTRAMUSCULAR | Status: DC | PRN
  Administered 2021-10-29: 5 mL

## 2021-10-29 MED ORDER — BUPIVACAINE HCL (PF) 0.5 % IJ SOLN
INTRAMUSCULAR | Status: DC | PRN
  Administered 2021-10-29: 5 mL

## 2021-10-29 MED ORDER — CHLORHEXIDINE GLUCONATE 0.12 % MT SOLN
15.0000 mL | Freq: Once | OROMUCOSAL | Status: AC
  Administered 2021-10-29: 15 mL via OROMUCOSAL

## 2021-10-29 MED ORDER — DIPHENHYDRAMINE HCL 50 MG/ML IJ SOLN
INTRAMUSCULAR | Status: AC
  Filled 2021-10-29: qty 1

## 2021-10-29 SURGICAL SUPPLY — 29 items
BLADE SURG 15 STRL LF DISP TIS (BLADE) ×3 IMPLANT
BLADE SURG 15 STRL SS (BLADE) ×2
BNDG ELASTIC 4X5.8 VLCR STR LF (GAUZE/BANDAGES/DRESSINGS) ×3 IMPLANT
CORD BIPOLAR FORCEPS 12FT (ELECTRODE) ×1 IMPLANT
COVER BACK TABLE 60X90IN (DRAPES) ×3 IMPLANT
CUFF TOURN SGL QUICK 18X4 (TOURNIQUET CUFF) ×3 IMPLANT
DRAPE EXTREMITY T 121X128X90 (DISPOSABLE) ×3 IMPLANT
DRAPE SHEET LG 3/4 BI-LAMINATE (DRAPES) ×4 IMPLANT
DRSG EMULSION OIL 3X3 NADH (GAUZE/BANDAGES/DRESSINGS) ×3 IMPLANT
GAUZE 4X4 16PLY ~~LOC~~+RFID DBL (SPONGE) ×3 IMPLANT
GAUZE SPONGE 4X4 12PLY STRL (GAUZE/BANDAGES/DRESSINGS) ×3 IMPLANT
GLOVE BIO SURGEON STRL SZ8 (GLOVE) ×3 IMPLANT
GLOVE BIOGEL PI IND STRL 7.5 (GLOVE) ×3 IMPLANT
GOWN STRL REUS W/TWL LRG LVL3 (GOWN DISPOSABLE) ×4 IMPLANT
KIT TURNOVER CYSTO (KITS) ×3 IMPLANT
NEEDLE HYPO 22GX1.5 SAFETY (NEEDLE) ×1 IMPLANT
PACK BASIN DAY SURGERY FS (CUSTOM PROCEDURE TRAY) ×3 IMPLANT
PAD CAST 4YDX4 CTTN HI CHSV (CAST SUPPLIES) ×5 IMPLANT
PADDING CAST COTTON 4X4 STRL (CAST SUPPLIES) ×2
SLING ARM FOAM STRAP LRG (SOFTGOODS) ×1 IMPLANT
SUT ETHILON 4 0 PS 2 18 (SUTURE) ×3 IMPLANT
SWAB COLLECTION DEVICE MRSA (MISCELLANEOUS) ×1 IMPLANT
SWAB CULTURE ESWAB REG 1ML (MISCELLANEOUS) ×1 IMPLANT
SYR 10ML LL (SYRINGE) ×1 IMPLANT
SYR BULB EAR ULCER 3OZ GRN STR (SYRINGE) ×3 IMPLANT
TAPE SURG TRANSPORE 1 IN (GAUZE/BANDAGES/DRESSINGS) ×3 IMPLANT
TAPE SURGICAL TRANSPORE 1 IN (GAUZE/BANDAGES/DRESSINGS) ×2
TOWEL OR 17X26 10 PK STRL BLUE (TOWEL DISPOSABLE) ×3 IMPLANT
UNDERPAD 30X36 HEAVY ABSORB (UNDERPADS AND DIAPERS) ×3 IMPLANT

## 2021-10-29 SURGICAL SUPPLY — 35 items
BNDG ELASTIC 4X5.8 VLCR STR LF (GAUZE/BANDAGES/DRESSINGS) ×2 IMPLANT
CORD BIPOLAR FORCEPS 12FT (ELECTRODE) IMPLANT
COVER BACK TABLE 60X90IN (DRAPES) ×2 IMPLANT
CUFF TOURN SGL QUICK 18X4 (TOURNIQUET CUFF) IMPLANT
DRAPE EXTREMITY T 121X128X90 (DISPOSABLE) IMPLANT
DRAPE ORTHO SPLIT 77X108 STRL (DRAPES) ×1
DRAPE SHEET LG 3/4 BI-LAMINATE (DRAPES) ×2 IMPLANT
DRAPE SURG 17X23 STRL (DRAPES) ×2 IMPLANT
DRAPE SURG ORHT 6 SPLT 77X108 (DRAPES) ×2 IMPLANT
ELECT REM PT RETURN 15FT ADLT (MISCELLANEOUS) IMPLANT
GAUZE 4X4 16PLY ~~LOC~~+RFID DBL (SPONGE) ×2 IMPLANT
GAUZE SPONGE 4X4 12PLY STRL (GAUZE/BANDAGES/DRESSINGS) ×2 IMPLANT
GAUZE XEROFORM 1X8 LF (GAUZE/BANDAGES/DRESSINGS) IMPLANT
GLOVE BIO SURGEON STRL SZ7.5 (GLOVE) ×4 IMPLANT
GLOVE BIOGEL PI IND STRL 7.5 (GLOVE) ×2 IMPLANT
GLOVE SURG ORTHO 8.0 STRL STRW (GLOVE) ×2 IMPLANT
GOWN STRL REUS W/ TWL LRG LVL3 (GOWN DISPOSABLE) ×2 IMPLANT
GOWN STRL REUS W/TWL LRG LVL3 (GOWN DISPOSABLE) ×1
HANDPIECE INTERPULSE COAX TIP (DISPOSABLE)
HIBICLENS CHG 4% 4OZ BTL (MISCELLANEOUS) ×2 IMPLANT
KIT BASIN OR (CUSTOM PROCEDURE TRAY) ×2 IMPLANT
KIT TURNOVER KIT A (KITS) ×2 IMPLANT
MANIFOLD NEPTUNE II (INSTRUMENTS) ×2 IMPLANT
NDL HYPO 25X1 1.5 SAFETY (NEEDLE) IMPLANT
NEEDLE HYPO 25X1 1.5 SAFETY (NEEDLE) IMPLANT
NS IRRIG 1000ML POUR BTL (IV SOLUTION) ×2 IMPLANT
SET HNDPC FAN SPRY TIP SCT (DISPOSABLE) IMPLANT
SPONGE T-LAP 4X18 ~~LOC~~+RFID (SPONGE) IMPLANT
SUT ETHILON 4 0 PS 2 18 (SUTURE) IMPLANT
SYR 10ML LL (SYRINGE) ×2 IMPLANT
SYR BULB EAR ULCER 3OZ GRN STR (SYRINGE) ×2 IMPLANT
TOWEL OR 17X26 10 PK STRL BLUE (TOWEL DISPOSABLE) ×2 IMPLANT
TUBING CONNECTING 10 (TUBING) ×2 IMPLANT
UNDERPAD 30X36 HEAVY ABSORB (UNDERPADS AND DIAPERS) ×2 IMPLANT
YANKAUER SUCT BULB TIP NO VENT (SUCTIONS) ×2 IMPLANT

## 2021-10-29 NOTE — Discharge Instructions (Signed)
  Orthopaedic Hand Surgery Discharge Instructions  WEIGHT BEARING STATUS: Non weight bearing on operative extremity  DRESSING CARE: Please keep your dressing/splint/cast clean and dry until your follow-up appointment. You may shower by placing a waterproof covering over your dressing/splint/cast. Contact your surgeon if your splint/cast gets wet. It will need to be changed to prevent skin breakdown.  PAIN CONTROL: First line medications for post operative pain control are Tylenol (acetaminophen) and Motrin (ibuprofen) if you are able to take these medications. If you have been prescribed a medication these can be taken as breakthrough pain medications. Please note that some narcotic pain medication has acetaminophen added and you should never consume more than 4,000mg of acetaminophen in 24-hour period. Please note that if you are given Toradol (ketorolac) you should not take similar medications such as ibuprofen or naproxen.  DISCHARGE MEDICATIONS: If you have been prescribed medication it was sent electronically to your pharmacy. No changes have been made to your home medications.  ICE/ELEVATION: Ice and elevate your injured extremity as needed. Avoid direct contact of ice with skin.   BANDAGE FEELS TOO TIGHT: If your bandage feels too tight, first make sure you are elevating your fingers as much as possible. The outer layer of the bandage can be unwrapped and reapplied more loosely. If no improvement, you may carefully cut the inner layer longitudinally until the pressure has resolved and then rewrap the outer layer. If you are not comfortable with these instructions, please call the office and the bandage can be changed for you.   FOLLOW UP: You will be called after surgery with an appointment date and time, however if you have not received a phone call within 3 days, please call during regular office hours at [phone] to schedule a post operative appointment.  Please Seek Medical Attention  if: Call MD for: pain or pressure in chest, jaw, arm, back, neck  Call MD for: temperature greater than 101 F for more than 24 hrs Call MD for: difficulty breathing Call MD for: incision redness, bleeding, drainage  Call MD for: palpitations or feeling that the heart is racing  Call MD for: increased swelling in arm, leg, ankle, or abdomen  Call MD for: lightheadedness, dizziness, fainting Call 911 or go to ER for any medical emergency if you are not able to get in touch with your doctor   J. Reid Hillel Card, MD Orthopaedic Hand Surgeon [facility] Office number: [phone] [address] [place],  [address]  

## 2021-10-29 NOTE — H&P (Signed)
  The note originally documented on this encounter has been moved the the encounter in which it belongs.  

## 2021-10-29 NOTE — Anesthesia Postprocedure Evaluation (Signed)
Anesthesia Post Note  Patient: [name]  Procedure(s) Performed: INCISION AND DRAINAGE LEFT ELBOW (Left: Elbow)     Patient location during evaluation: PACU Anesthesia Type: General Level of consciousness: sedated and patient cooperative Pain management: pain level controlled Vital Signs Assessment: post-procedure vital signs reviewed and stable Respiratory status: spontaneous breathing Cardiovascular status: stable Anesthetic complications: no   No notable events documented.  Last Vitals:  Vitals:   10/29/21 1545 10/29/21 1612  BP: (!) 149/95 (!) 158/86  Pulse: 72 73  Resp: 20 15  Temp: 36.6 C 36.6 C  SpO2: 97% 98%    Last Pain:  Vitals:   10/29/21 1612  TempSrc:   PainSc: 0-No pain                 Nolon Nations

## 2021-10-29 NOTE — Anesthesia Preprocedure Evaluation (Signed)
Anesthesia Evaluation  Patient identified by MRN, date of birth, ID band Patient awake    Reviewed: Allergy & Precautions, NPO status , Patient's Chart, lab work & pertinent test results  Airway Mallampati: II  TM Distance: >3 FB Neck ROM: Full    Dental no notable dental hx.    Pulmonary neg pulmonary ROS, former smoker,    Pulmonary exam normal breath sounds clear to auscultation       Cardiovascular hypertension, Normal cardiovascular exam Rhythm:Regular Rate:Normal     Neuro/Psych negative neurological ROS  negative psych ROS   GI/Hepatic Neg liver ROS, GERD  ,  Endo/Other  negative endocrine ROS  Renal/GU negative Renal ROS  negative genitourinary   Musculoskeletal negative musculoskeletal ROS (+)   Abdominal   Peds negative pediatric ROS (+)  Hematology negative hematology ROS (+)   Anesthesia Other Findings   Reproductive/Obstetrics negative OB ROS                             Anesthesia Physical Anesthesia Plan  ASA: 2  Anesthesia Plan: General   Post-op Pain Management: Minimal or no pain anticipated   Induction: Intravenous  PONV Risk Score and Plan: 3 and Ondansetron, Dexamethasone and Treatment may vary due to age or medical condition  Airway Management Planned: LMA  Additional Equipment:   Intra-op Plan:   Post-operative Plan: Extubation in OR  Informed Consent: I have reviewed the patients History and Physical, chart, labs and discussed the procedure including the risks, benefits and alternatives for the proposed anesthesia with the patient or authorized representative who has indicated his/her understanding and acceptance.     Dental advisory given  Plan Discussed with: CRNA and Surgeon  Anesthesia Plan Comments:         Anesthesia Quick Evaluation

## 2021-10-29 NOTE — Transfer of Care (Signed)
Immediate Anesthesia Transfer of Care Note  Patient: [name]  Procedure(s) Performed: INCISION AND DRAINAGE LEFT ELBOW (Left: Elbow)  Patient Location: PACU  Anesthesia Type:General  Level of Consciousness: awake, alert , oriented and patient cooperative  Airway & Oxygen Therapy: Patient Spontanous Breathing and Patient connected to face mask oxygen  Post-op Assessment: Report given to RN and Post -op Vital signs reviewed and stable  Post vital signs: Reviewed and stable  Last Vitals:  Vitals Value Taken Time  BP 159/86 10/29/21 1520  Temp    Pulse 76 10/29/21 1522  Resp 15 10/29/21 1522  SpO2 98 % 10/29/21 1522  Vitals shown include unvalidated device data.  Last Pain:  Vitals:   10/29/21 1338  TempSrc: Oral  PainSc: 10-Worst pain ever         Complications: No notable events documented.

## 2021-10-29 NOTE — Anesthesia Procedure Notes (Signed)
Procedure Name: LMA Insertion Date/Time: 10/29/2021 2:35 PM  Performed by: Rogers Blocker, CRNAPre-anesthesia Checklist: Patient identified, Emergency Drugs available, Suction available and Patient being monitored Patient Re-evaluated:Patient Re-evaluated prior to induction Oxygen Delivery Method: Circle System Utilized Preoxygenation: Pre-oxygenation with 100% oxygen Induction Type: IV induction Ventilation: Mask ventilation without difficulty LMA: LMA inserted LMA Size: 4.0 Number of attempts: 1 Placement Confirmation: positive ETCO2 Tube secured with: Tape Dental Injury: Teeth and Oropharynx as per pre-operative assessment

## 2021-10-29 NOTE — Op Note (Signed)
OPERATIVE NOTE  DATE OF PROCEDURE: 10/29/2021  SURGEONS:  Primary: Orene Desanctis, MD  PREOPERATIVE DIAGNOSIS: Left elbow olecranon bursitis, septic  POSTOPERATIVE DIAGNOSIS: Same  NAME OF PROCEDURE:   Left elbow olecranon bursa excision I&D left elbow infected bursa  ANESTHESIA: LMA + Local  SKIN PREPARATION: Hibiclens  ESTIMATED BLOOD LOSS: Minimal  IMPLANTS: none  INDICATIONS:  [name] is a 71 y.o. female who has the above preoperative diagnosis. The patient has decided to proceed with surgical intervention.  Risks, benefits and alternatives of operative management were discussed including, but not limited to, risks of anesthesia complications, infection, pain, persistent symptoms, stiffness, need for future surgery.  The patient understands, agrees and elects to proceed with surgery.    DESCRIPTION OF PROCEDURE: The patient was met in the pre-operative area and their identity was verified.  The operative location and laterality was also verified and marked.  The patient was brought to the OR and was placed supine on the table.  After repeat patient identification with the operative team anesthesia was provided and the patient was prepped and draped in the usual sterile fashion.  A final timeout was performed verifying the correction patient, procedure, location and laterality.  Preoperative antibiotics were provided and the left upper extremity was elevated and exsanguinated with an Esmarch and tourniquet inflated to 250 mmHg.  A curvilinear incision was made over the posterior lateral aspect of the left olecranon.  Skin and subcutaneous tissues were divided and careful hemostasis was obtained. Purulent material was expressed and sinus tract excised. Cultures were obtained. The olecranon bursa was thick and inflamed with chronic scarring.  This was identified and excised completely.  There was significant amount of bursal fluid and thickening of the bursa.  This was excised in its  entirety.  Bipolar electrocautery was utilized to obtain careful hemostasis throughout the bursa. The wound was thoroughly irrigated with normal saline.  The skin was closed with 4-0 horizontal mattress nylon sutures.  A sterile soft bulky bandage was applied. The tourniquet was deflated and the fingers were pink and warm and well-perfused at the end of the case.  The patient tolerated the procedure well.  All counts were correct x2.  The patient was awoken from anesthesia and brought to PACU for recovery in stable condition.   Matt Holmes, MD

## 2021-10-29 NOTE — Progress Notes (Signed)
Surgery cancelled at [facility] per Dr. Greta Doom, moving to [place].

## 2021-10-29 NOTE — Anesthesia Preprocedure Evaluation (Deleted)
Anesthesia Evaluation  Patient identified by MRN, date of birth, ID band Patient awake    Reviewed: Allergy & Precautions, NPO status , Patient's Chart, lab work & pertinent test results  History of Anesthesia Complications Negative for: history of anesthetic complications  Airway Mallampati: I  TM Distance: >3 FB Neck ROM: Full    Dental  (+) Edentulous Upper, Dental Advisory Given   Pulmonary former smoker,    Pulmonary exam normal        Cardiovascular hypertension, Pt. on medications (-) anginaNormal cardiovascular exam     Neuro/Psych Anxiety Depression negative neurological ROS     GI/Hepatic Neg liver ROS, GERD  Medicated and Controlled,  Endo/Other  Morbid obesity  Renal/GU Renal InsufficiencyRenal disease     Musculoskeletal  (+) Arthritis ,   Abdominal (+) + obese,   Peds  Hematology negative hematology ROS (+)   Anesthesia Other Findings H/o breast cancer  Reproductive/Obstetrics                            Anesthesia Physical  Anesthesia Plan  ASA: 3  Anesthesia Plan: General   Post-op Pain Management: Tylenol PO (pre-op)*   Induction: Intravenous  PONV Risk Score and Plan: 3 and Ondansetron, Treatment may vary due to age or medical condition and Dexamethasone  Airway Management Planned: LMA  Additional Equipment: None  Intra-op Plan:   Post-operative Plan:   Informed Consent: I have reviewed the patients History and Physical, chart, labs and discussed the procedure including the risks, benefits and alternatives for the proposed anesthesia with the patient or authorized representative who has indicated his/her understanding and acceptance.     Dental advisory given  Plan Discussed with: Anesthesiologist  Anesthesia Plan Comments:        Anesthesia Quick Evaluation

## 2021-10-29 NOTE — H&P (Signed)
Preoperative History & Physical Exam  Surgeon: Matt Holmes, MD  Diagnosis: Left elbow olecranon bursitis  Planned Procedure: Procedure(s) (LRB): IRRIGATION AND DEBRIDEMENT ELBOW (Left)  History of Present Illness:   Patient is a 71 y.o. female with symptoms consistent with Left elbow olecranon bursitis who presents for surgical intervention. The risks, benefits and alternatives of surgical intervention were discussed and informed consent was obtained prior to surgery.  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   Depression    Fatty liver    GERD (gastroesophageal reflux disease)    Hernia    umbilical   Hyperlipidemia    Hypertension    Olecranon bursitis, left elbow     Past Surgical History:  Past Surgical History:  Procedure Laterality Date   ABDOMINAL HYSTERECTOMY     Partial 30-40 yrs ago   BACK SURGERY     4 Bilateral Titanium Screws near L4 and L5   BREAST LUMPECTOMY WITH RADIOACTIVE SEED AND SENTINEL LYMPH NODE BIOPSY Right 10/01/2019   Procedure: RIGHT BREAST LUMPECTOMY WITH RADIOACTIVE SEED AND SENTINEL LYMPH NODE BIOPSY;  Surgeon: Erroll Luna, MD;  Location: [facility];  Service: General;  Laterality: Right;  [place]     2001 or 2002   EXCISION / CURETTAGE BONE CYST PHALANGES OF FOOT Right    HEEL SPUR EXCISION Left    keloids removed from ears     yrs ago   OLECRANON BURSECTOMY Left 07/27/2021   Procedure: left elbow olecranon bursectomy;  Surgeon: Orene Desanctis, MD;  Location: [facility];  Service: Orthopedics;  Laterality: Left;   PORT-A-CATH REMOVAL Right 07/15/2020   Procedure: REMOVAL PORT-A-CATH;  Surgeon: Erroll Luna, MD;  Location: [facility];  Service: General;  Laterality: Right;   PORTACATH  PLACEMENT Right 11/12/2019   Procedure: INSERTION PORT-A-CATH WITH ULTRASOUND GUIDANCE, ACCESSED;  Surgeon: Erroll Luna, MD;  Location: [facility];  Service: General;  Laterality: Right;   [place]  09/26/2012   Dr Rolena Infante   SPINAL CORD STIMULATOR INSERTION N/A 09/26/2012   Procedure: SPINAL CORD STIMULATOR PLACEMENT;  Surgeon: Melina Schools, MD;  Location: [facility];  Service: Orthopedics;  Laterality: N/A;   SPINAL CORD STIMULATOR REMOVAL     2013 or 2014, leads left in per patient   TONSILLECTOMY     age 7    Medications:  Prior to Admission medications   Medication Sig Start Date End Date Taking? Authorizing Provider  ALPRAZolam Duanne Moron) 0.5 MG tablet Take 0.25-0.5 mg by mouth 2 (two) times daily as needed for anxiety.   Yes [provider]  amLODipine-benazepril (LOTREL) 10-20 MG per capsule Take 1 capsule by mouth in the morning.   Yes [provider]  anastrozole (ARIMIDEX) 1 MG tablet TAKE ONE TABLET BY MOUTH ONE TIME DAILY 07/07/21  Yes Nicholas Lose, MD  Cholecalciferol (VITAMIN D3 PO) Take 1 tablet by mouth in the morning.   Yes [provider]  doxycycline (VIBRAMYCIN) 100 MG capsule Take 100 mg by mouth 2 (two) times daily. 10/21/21  Yes [provider]  eszopiclone 3 MG TABS Take 1 tablet (3 mg total) by mouth at bedtime as needed for sleep. Take immediately before bedtime Patient taking differently:  Take 3 mg by mouth at bedtime. Take immediately before bedtime 12/25/19  Yes Nicholas Lose, MD  hydrochlorothiazide (HYDRODIURIL) 25 MG tablet Take 25 mg by mouth in the morning.   Yes [provider]  Multiple Vitamins-Minerals (MULTIVITAMIN WITH MINERALS) tablet Take 1 tablet by mouth daily. Centrum Silver   Yes [provider]  omeprazole (PRILOSEC) 20 MG capsule Take 1 capsule (20 mg total) by mouth daily. 11/13/19  Yes Nicholas Lose, MD  rosuvastatin (CRESTOR) 10 MG tablet  09/01/19  Yes  [provider]  sertraline (ZOLOFT) 100 MG tablet Take 200 mg by mouth in the morning.   Yes [provider]  Aspirin-Salicylamide-Caffeine (BC HEADACHE POWDER PO) Take 1 packet by mouth daily as needed (pain.).    [provider]  ibuprofen (ADVIL) 800 MG tablet Take 1 tablet (800 mg total) by mouth every 8 (eight) hours as needed. Patient not taking: Reported on 10/25/2021 07/27/21   Orene Desanctis, MD    Allergies:  Atorvastatin, Ceftin [cefuroxime], Other, and Zofran [ondansetron]  Review of Systems: Negative except per HPI.  Physical Exam: Alert and oriented, NAD Head and neck: no masses, normal alignment CV: pulse intact Pulm: no increased work of breathing, respirations even and unlabored Abdomen: non-distended Extremities: extremities warm and well perfused  LABS: Recent Results (from the past 2160 hour(s))  No blood products     Status: None   Collection Time: 10/28/21  8:50 AM  Result Value Ref Range   Transfuse no blood products      TRANSFUSE NO BLOOD PRODUCTS, VERIFIED BY Norvel Richards RN [id number] AT [phone] Performed at [facility], [place] [address] [place], [place] [address]   Basic metabolic panel per protocol     Status: Abnormal   Collection Time: 10/28/21  9:02 AM  Result Value Ref Range   Sodium 140 135 - 145 mmol/L   Potassium 3.2 (L) 3.5 - 5.1 mmol/L   Chloride 107 98 - 111 mmol/L   CO2 23 22 - 32 mmol/L   Glucose, Bld 101 (H) 70 - 99 mg/dL    Comment: Glucose reference range applies only to samples taken after fasting for at least 8 hours.   BUN 26 (H) 8 - 23 mg/dL   Creatinine, Ser 0.67 0.44 - 1.00 mg/dL   Calcium 9.6 8.9 - 10.3 mg/dL   GFR, Estimated >60 >60 mL/min    Comment: (NOTE) Calculated using the CKD-EPI Creatinine Equation (2021)    Anion gap 10 5 - 15    Comment: Performed at [facility], [place] [address], [place], [place] [address]  CBC per protocol     Status: None   Collection  Time: 10/28/21  9:02 AM  Result Value Ref Range   WBC 5.0 4.0 - 10.5 K/uL   RBC 4.40 3.87 - 5.11 MIL/uL   Hemoglobin 14.1 12.0 - 15.0 g/dL   HCT 42.3 36.0 - 46.0 %   MCV 96.1 80.0 - 100.0 fL   MCH 32.0 26.0 - 34.0 pg   MCHC 33.3 30.0 - 36.0 g/dL   RDW 14.2 11.5 - 15.5 %   Platelets 188 150 - 400 K/uL   nRBC 0.0 0.0 - 0.2 %    Comment: Performed at [facility], [place] [address], [place], [place] [address]     Complete History and Physical exam available in the office notes  Orene Desanctis

## 2021-11-01 ENCOUNTER — Encounter (HOSPITAL_BASED_OUTPATIENT_CLINIC_OR_DEPARTMENT_OTHER): Payer: Self-pay | Admitting: Orthopedic Surgery

## 2021-11-03 LAB — AEROBIC/ANAEROBIC CULTURE W GRAM STAIN (SURGICAL/DEEP WOUND)
Culture: NORMAL
Gram Stain: NONE SEEN

## 2021-11-05 ENCOUNTER — Ambulatory Visit
Admission: RE | Admit: 2021-11-05 | Discharge: 2021-11-05 | Disposition: A | Discharge status: Home / Self Care | Payer: Medicare HMO | Source: Ambulatory Visit | Attending: *Deleted | Admitting: *Deleted

## 2021-11-05 DIAGNOSIS — Z9889 Other specified postprocedural states: Secondary | ICD-10-CM

## 2021-11-05 HISTORY — DX: Personal history of irradiation: Z92.3

## 2021-11-08 ENCOUNTER — Ambulatory Visit: Payer: Medicare HMO | Admitting: Physical Therapy

## 2021-11-08 ENCOUNTER — Encounter: Payer: Self-pay | Admitting: Physical Therapy

## 2021-11-08 DIAGNOSIS — C50411 Malignant neoplasm of upper-outer quadrant of right female breast: Secondary | ICD-10-CM

## 2021-11-08 DIAGNOSIS — R293 Abnormal posture: Secondary | ICD-10-CM

## 2021-11-08 DIAGNOSIS — N63 Unspecified lump in unspecified breast: Secondary | ICD-10-CM

## 2021-11-08 DIAGNOSIS — R600 Localized edema: Secondary | ICD-10-CM

## 2021-11-08 NOTE — Therapy (Signed)
OUTPATIENT PHYSICAL THERAPY ONCOLOGY TREATMENT  Patient Name: [name] MRN: [id number] DOB:02/17/1950, 71 y.o., female Today's Date: 11/08/2021   PT End of Session - 11/08/21 1611     Visit Number 2    Number of Visits 12    Date for PT Re-Evaluation 12/02/21    PT Start Time 1607    PT Stop Time 1700    PT Time Calculation (min) 53 min    Activity Tolerance Patient tolerated treatment well    Behavior During Therapy WFL for tasks assessed/performed             Past Medical History:  Diagnosis Date   AKI (acute kidney injury) ([facility]) 2016   all issues resolved yrs ago per pt on 07-22-2021   Anxiety    Arthritis    Cancer ([facility]) 09/2019   right breast IDC   Depression    Fatty liver    GERD (gastroesophageal reflux disease)    Hernia    umbilical   Hyperlipidemia    Hypertension    Olecranon bursitis, left elbow    Personal history of radiation therapy    Past Surgical History:  Procedure Laterality Date   ABDOMINAL HYSTERECTOMY     Partial 30-40 yrs ago   BACK SURGERY     4 Bilateral Titanium Screws near L4 and L5   BREAST BIOPSY Right 09/02/2019   times 2   BREAST LUMPECTOMY Right 10/01/2019   BREAST LUMPECTOMY WITH RADIOACTIVE SEED AND SENTINEL LYMPH NODE BIOPSY Right 10/01/2019   Procedure: RIGHT BREAST LUMPECTOMY WITH RADIOACTIVE SEED AND SENTINEL LYMPH NODE BIOPSY;  Surgeon: Cornett, Thomas, MD;  Location: [facility];  Service: General;  Laterality: Right;  PEC BLOCK   CHOLECYSTECTOMY     2001 or 2002   EXCISION / CURETTAGE BONE CYST PHALANGES OF FOOT Right    HEEL SPUR EXCISION Left    keloids removed from ears     yrs ago   OLECRANON BURSECTOMY Left 07/27/2021   Procedure: left elbow olecranon bursectomy;  Surgeon: Spears, James, MD;  Location: [facility];  Service: Orthopedics;  Laterality: Left;   OLECRANON BURSECTOMY Left 10/29/2021   Procedure: INCISION AND DRAINAGE LEFT ELBOW;  Surgeon: Spears, James, MD;   Location: [facility];  Service: Orthopedics;  Laterality: Left;   PORT-A-CATH REMOVAL Right 07/15/2020   Procedure: REMOVAL PORT-A-CATH;  Surgeon: Cornett, Thomas, MD;  Location: [facility];  Service: General;  Laterality: Right;   PORTACATH PLACEMENT Right 11/12/2019   Procedure: INSERTION PORT-A-CATH WITH ULTRASOUND GUIDANCE, ACCESSED;  Surgeon: Cornett, Thomas, MD;  [facility];  Service: General;  Laterality: Right;   SPINAL CORD STIMULATOR INSERTION  09/26/2012   Dr BROOKS   SPINAL CORD STIMULATOR INSERTION N/A 09/26/2012   Procedure: SPINAL CORD STIMULATOR PLACEMENT;  Surgeon: Dahari Brooks, MD;  [facility];  Service: Orthopedics;  Laterality: N/A;   SPINAL CORD STIMULATOR REMOVAL     2013 or 2014, leads left in per patient   TONSILLECTOMY     age 9   Patient Active Problem List   Diagnosis Date Noted   Port-A-Cath in place 11/21/2019   Malignant neoplasm of upper-outer quadrant of right breast in female, estrogen receptor positive ([facility]) 09/17/2019   AKI (acute kidney injury) ([facility]) 08/03/2014   Nausea with vomiting 08/03/2014   Dehydration 08/03/2014   Bilateral flank pain 05/09/2012   HTN (hypertension) 05/09/2012   GERD (gastroesophageal reflux disease) 05/09/2012   Adjustment   disorder with mixed anxiety and depressed mood 05/09/2012    PCP: Burr Medico NP  REFERRING PROVIDER: Nicholas Lose MD  REFERRING DIAG: Right breast Cancer  THERAPY DIAG:  Breast swelling  Localized edema  Abnormal posture  Malignant neoplasm of upper-outer quadrant of right breast in female, estrogen receptor positive ([place])  ONSET DATE: 08/2020 with exacerbation  Rationale for Evaluation and Treatment Rehabilitation  SUBJECTIVE                                                                                                                                                                                           SUBJECTIVE  STATEMENT: I am so overwhelmed. I have had two surgeries on my elbow that have failed since I was here last and I just have to pick my battles. This is going to be my last time here.     PERTINENT HISTORY:  diagnosed with Rt ER positive, PR/HER2 negative IDC with lumpectomy and SLNB on 10/01/19 with 0/7 lymph nodes removed.  other health history of HTN, anxiety, hysterectomy, 6-8 lumbar surgeries over the years including,spinal stimulator that was removed and pt reports only the leads are in place now. Has OA all over. Can't hold arms up long  PAIN:  Are you having pain? Yes NPRS scale: 8/10 elbow, 10/10 LB Pain location: elbow and back Pain orientation: Left elbow, bilateral LB PAIN TYPE: sharp elbow, crepitus in LB Pain description: constant and stabbing , elbow/LB Aggravating factors: back: movement, sitting, standing, Elbow:leaning on elbow, at rest Relieving factors: nothing for LB, or elbow  PRECAUTIONS: HTN, anxiety, hysterectomy, 6-8 lumbar surgeries over the years including,spinal stimulator that was removed and pt reports only the leads are in place now. Has OA all over, righ UE lymphedema risk   WEIGHT BEARING RESTRICTIONS No but has difficulty walking and uses rollator  FALLS:  Has patient fallen in last 6 months? No  LIVING ENVIRONMENT: Lives with: lives with their family Lives in: House/apartment Stairs: Yes; Internal: 14 steps; on left going up Has following equipment at home: Walker - 2 wheeled and bed side commode  OCCUPATION: Retired, on disability  LEISURE: play on phone, TV  HAND DOMINANCE : right   PRIOR LEVEL OF FUNCTION: Independent with household mobility with device  PATIENT GOALS Decrease Right breast swelling   OBJECTIVE  COGNITION:  Overall cognitive status:   Within functional limits for tasks assessed PALPATION: Mild tenderness and pitting at inferior breast, tender where porta cath has keloid and under arm keloid  OBSERVATIONS / OTHER  ASSESSMENTS:  Visibly increased swelling in right breast with hyperpigmentation noted from prior radiation.  Pt with  enlarged pores, and mild pitting at inferior breast, keloid scars noted from biopsy and portacatch        SENSATION:  Light touch: Appears intact    POSTURE: forward head, rounded shoulders increased lumbar lordosis, increased thoracic kyphosis  UPPER EXTREMITY AROM/PROM:  A/PROM RIGHT   eval   Shoulder extension   Shoulder flexion 140  Shoulder abduction 145  Shoulder internal rotation   Shoulder external rotation     (Blank rows = not tested)  A/PROM LEFT   eval  Shoulder extension   Shoulder flexion 142 sitting  Shoulder abduction 140 sitting  Shoulder internal rotation   Shoulder external rotation     (Blank rows = not tested)   CERVICAL AROM: All within normal limits:      UPPER EXTREMITY STRENGTH: NT due to OA  LYMPHEDEMA ASSESSMENTS:   SURGERY TYPE/DATE: Right lumpectomy 10/01/2019  NUMBER OF LYMPH NODES REMOVED: 7  CHEMOTHERAPY: yes  RADIATION:yes, ended 04/28/2020  HORMONE TREATMENT: yes  INFECTIONS: yes , 09/2020  LYMPHEDEMA ASSESSMENTS:    Chest measurement under arms 104 Widest part of chest above right nipple 124.2  LANDMARK RIGHT  eval  10 cm proximal to olecranon process 30.7  Olecranon process 25.6  10 cm proximal to ulnar styloid process 22.3  Just proximal to ulnar styloid process 15.6  Across hand at thumb web space 19.3  At base of 2nd digit 6.3  (Blank rows = not tested)  LANDMARK LEFT  eval  10 cm proximal to olecranon process 31.4  Olecranon process 27 over bandage from prior sx  10 cm proximal to ulnar styloid process 22.9  Just proximal to ulnar styloid process 16.0  Across hand at thumb web space 19.0  At base of 2nd digit 6.4  (Blank rows = not tested)  Assistive device utilized: Environmental consultant - 2 wheeled Level of assistance: Modified independence Comments:      Breast complaints survey 33 at eval,  11/08/21: 34   TODAY'S TREATMENT  11/08/21: In supine: Short neck, 5 diaphragmatic breaths, L axillary nodes and establishment of interaxillary pathway, R inguinal nodes and establishment of axilloinguinal pathway, then R breast moving fluid towards pathways spending extra time in any areas of fibrosis then retracing all steps. Cut 1/2 grey foam for pt to wear in her compression bra where she is fibrotic and created a foam chip pack for pt to wear in her compression bra.   Eval:Advised pt to begin wearing her compression bra daily and to continue MLD to the left breast. Will send demographics again for Flexitouch today.  PATIENT EDUCATION:  Education details: wear compression bra, continue MLD, approval to send demographics for Flexitouch Person educated: Patient Education method: Explanation Education comprehension: verbalized understanding   HOME EXERCISE PROGRAM: None given  ASSESSMENT:  CLINICAL IMPRESSION: Pt has has two different failed elbow surgeries since her evaluation and has increased back pain. She is feeling overwhelmed with appointments at this time and would like for today to be her last day. She has a compression bra and has been sleeping in it. Created a foam pad today for pt to wear in her bra to add additional compression as needed as well as a chip pack and educated her to alternate between the two as needed. Pt encouraged to continue with daily self MLD and wear the compression bra. She will be discharged from skilled PT services at this time.     OBJECTIVE IMPAIRMENTS decreased knowledge of condition, increased edema, and pain.   ACTIVITY  LIMITATIONS carrying, sitting, standing, and bed mobility  PARTICIPATION LIMITATIONS: meal prep, laundry, and driving  PERSONAL FACTORS 3+ comorbidities: Right breast Cancer s/p surgery and radiation, severe LBP  are also affecting patient's functional outcome.   REHAB POTENTIAL: Good  CLINICAL DECISION MAKING:  Stable/uncomplicated  EVALUATION COMPLEXITY: Low  GOALS: Goals reviewed with patient? Yes 11/29/2021 SHORT TERM GOALS: Target date: 11/29/2021    Pt will report decreased right breast discomfort and heaviness by 25% Baseline: Goal status: MET 11/08/21    LONG TERM GOALS: Target date: 12/20/2021    Pt will be compliant with use of compression bra and foam as needed to decrease swelling Baseline:  Goal status: MET 11/08/21  2.  Pt will report decreased right breast heaviness /discomfort by 50% or greater Baseline:  Goal status: MET 11/08/21- 70% improved  3.  Pts breast complaints survery will decrease to 16 or less to demonstrate improvements Baseline:  Goal status: NOT MET 11/08/21: 34  4.  Pt will be approved for Flexitouch to assist with right breast swelling Baseline:  Goal status: NOT MET 11/08/21 - still waiting on approval    PLAN: PT FREQUENCY: 2x/week  PT DURATION: 6 weeks  PLANNED INTERVENTIONS: Therapeutic exercises, Patient/Family education, Self Care, Manual lymph drainage, scar mobilization, Vasopneumatic device, Manual therapy, and Re-evaluation  PLAN FOR NEXT SESSION: d/c today per pt requrest    Breedlove Blue, PT 11/08/2021, 5:14 PM  PHYSICAL THERAPY DISCHARGE SUMMARY  Visits from Start of Care: 2  Current functional level related to goals / functional outcomes: See above, pt still with high score on breast complaints scale but has improved edema in breast, still awaiting insurance approval after denial for pump   Remaining deficits: See above   Education / Equipment: MLD, compression bra   Patient agrees to discharge. Patient goals were partially met. Patient is being discharged due to the patient's request.   Breedlove Blue, PT 11/08/21 5:18 PM   

## 2021-11-15 ENCOUNTER — Ambulatory Visit: Payer: Medicare HMO

## 2021-11-17 ENCOUNTER — Ambulatory Visit: Payer: Medicare HMO

## 2021-11-22 ENCOUNTER — Encounter: Payer: Medicare HMO | Admitting: Physical Therapy

## 2021-11-26 ENCOUNTER — Other Ambulatory Visit: Payer: Self-pay

## 2021-11-26 MED ORDER — ANASTROZOLE 1 MG PO TABS: 1.0000 mg | ORAL_TABLET | Freq: Every day | ORAL | 3 refills | Status: DC

## 2021-12-06 ENCOUNTER — Encounter: Payer: Medicare HMO | Admitting: Physical Therapy

## 2022-02-28 ENCOUNTER — Ambulatory Visit: Payer: Medicare HMO | Admitting: Hematology and Oncology

## 2022-03-07 ENCOUNTER — Other Ambulatory Visit: Payer: Self-pay

## 2022-03-07 ENCOUNTER — Inpatient Hospital Stay: Payer: Medicare HMO | Attending: Hematology and Oncology | Admitting: Hematology and Oncology

## 2022-03-07 VITALS — BP 138/97 | HR 94 | Temp 97.7°F | Resp 18 | Ht 64.0 in | Wt 189.5 lb

## 2022-03-07 DIAGNOSIS — C50411 Malignant neoplasm of upper-outer quadrant of right female breast: Secondary | ICD-10-CM | POA: Diagnosis not present

## 2022-03-07 DIAGNOSIS — Z170 Estrogen receptor positive status [ER+]: Secondary | ICD-10-CM | POA: Diagnosis not present

## 2022-03-07 DIAGNOSIS — Z79811 Long term (current) use of aromatase inhibitors: Secondary | ICD-10-CM | POA: Diagnosis not present

## 2022-03-07 MED ORDER — ANASTROZOLE 1 MG PO TABS: 1.0000 mg | ORAL_TABLET | Freq: Every day | ORAL | 3 refills | Status: DC

## 2022-03-07 NOTE — Progress Notes (Signed)
Patient Care Team: Martha Beals, NP as PCP - General Martha Lose, MD as Consulting Physician (Hematology and Oncology) Martha Rudd, MD as Consulting Physician (Radiation Oncology) Martha Luna, MD as Consulting Physician (General Surgery)  DIAGNOSIS:  Encounter Diagnosis  Name Primary?   Malignant neoplasm of upper-outer quadrant of right breast in female, estrogen receptor positive ([name]) Yes    SUMMARY OF ONCOLOGIC HISTORY: Oncology History  Malignant neoplasm of upper-outer quadrant of right breast in female, estrogen receptor positive ([name])  09/17/2019 Initial Diagnosis   Screening mammogram detected a right breast mass, not palpable on exam. Diagnostic mammogram showed 1.0cm mass at the 12:30 position in the right breast, with a mildly abnormal right axillary lymph node, 4.73m. Biopsy showed IDC in the breast, grade 3, HER-2 negative (1+), ER+ 70%, PR- 0%, Ki67 85%, and the lymph node negative for carcinoma.   09/24/2019 Cancer Staging   Staging form: Breast, AJCC 8th Edition - Clinical stage from 09/24/2019: Stage IB (cT1b, cN0(f), cM0, G3, ER+, PR-, HER2-)   10/01/2019 Surgery   Right lumpectomy ([facility]): IDC, grade 3, 2.8cm, clear margins, 7 right axillary lymph nodes negative for carcinoma.   10/23/2019 Oncotype testing   Oncotype DX recurrence score 68: Greater than 39% risk of distant recurrence at 9 years   11/13/2019 - 02/26/2020 Chemotherapy   Cytoxan x 6 (11/13/2019 - 02/26/2020) Adrucil x 6 (11/13/2019 - 02/26/2020) Methotrexate x 6 (11/13/2019 - 02/26/2020)   04/01/2020 - 04/28/2020 Radiation Therapy   The patient initially received a dose of 42.56 Gy in 16 fractions to the breast using whole-breast tangent fields. This was delivered using a 3-D conformal technique. The pt received a boost delivering an additional 8 Gy in 4 fractions using a electron boost with 149m electrons. The total dose was 50.56 Gy.   04/2020 - 04/2027 Anti-estrogen oral therapy    Anastrozole     CHIEF COMPLIANT: Follow-up on anastrozole therapy   INTERVAL HISTORY: MaMONROE Castro a [age]ear old with above-mentioned history of right breast cancer who is currently on antiestrogen therapy with anastrozole. She presents to the clinic for a follow-up. She states that she is tolerating the anastrozole with no side effects or concerns. She says she still have hot flashes and joint stiffness, but it is not related to the anastrazole.   ALLERGIES:  is allergic to atorvastatin, ceftin [cefuroxime], other, and zofran [ondansetron].  MEDICATIONS:  Current Outpatient Medications  Medication Sig Dispense Refill   ALPRAZolam (XANAX) 0.5 MG tablet Take 0.25-0.5 mg by mouth 2 (two) times daily as needed for anxiety.     amLODipine-benazepril (LOTREL) 10-20 MG per capsule Take 1 capsule by mouth in the morning.     anastrozole (ARIMIDEX) 1 MG tablet Take 1 tablet (1 mg total) by mouth daily. 90 tablet 3   Aspirin-Salicylamide-Caffeine (BC HEADACHE POWDER PO) Take 1 packet by mouth daily as needed (pain.).     Cholecalciferol (VITAMIN D3 PO) Take 1 tablet by mouth in the morning.     doxycycline (VIBRAMYCIN) 100 MG capsule Take 1 capsule (100 mg total) by mouth 2 (two) times daily. 28 capsule 0   eszopiclone 3 MG TABS Take 1 tablet (3 mg total) by mouth at bedtime as needed for sleep. Take immediately before bedtime (Patient taking differently: Take 3 mg by mouth at bedtime. Take immediately before bedtime)     hydrochlorothiazide (HYDRODIURIL) 25 MG tablet Take 25 mg by mouth in the morning.     ibuprofen (ADVIL) 800 MG tablet Take  1 tablet (800 mg total) by mouth every 8 (eight) hours as needed. (Patient not taking: Reported on 10/25/2021) 30 tablet 0   Multiple Vitamins-Minerals (MULTIVITAMIN WITH MINERALS) tablet Take 1 tablet by mouth daily. Centrum Silver     omeprazole (PRILOSEC) 20 MG capsule Take 1 capsule (20 mg total) by mouth daily.     rosuvastatin (CRESTOR) 10 MG tablet       sertraline (ZOLOFT) 100 MG tablet Take 200 mg by mouth in the morning.     No current facility-administered medications for this visit.    PHYSICAL EXAMINATION: ECOG PERFORMANCE STATUS: 1 - Symptomatic but completely ambulatory  Vitals:   03/07/22 1445  BP: (!) 138/97  Pulse: 94  Resp: 18  Temp: 97.7 F (36.5 C)  SpO2: 100%   Filed Weights   03/07/22 1445  Weight: 189 lb 8 oz (86 kg)      LABORATORY DATA:  I have reviewed the data as listed    Latest Ref Rng & Units 10/28/2021    9:02 AM 07/27/2021   12:11 PM 07/10/2020    3:57 PM  CMP  Glucose 70 - 99 mg/dL 101  112  100   BUN 8 - 23 mg/dL '26  14  10   '$ Creatinine 0.44 - 1.00 mg/dL 0.67  0.80  0.89   Sodium 135 - 145 mmol/L 140  142  138   Potassium 3.5 - 5.1 mmol/L 3.2  3.1  3.0   Chloride 98 - 111 mmol/L 107  104  104   CO2 22 - 32 mmol/L 23   25   Calcium 8.9 - 10.3 mg/dL 9.6   9.2   Total Protein 6.5 - 8.1 g/dL   7.5   Total Bilirubin 0.3 - 1.2 mg/dL   0.4   Alkaline Phos 38 - 126 U/L   115   AST 15 - 41 U/L   36   ALT 0 - 44 U/L   26     Lab Results  Component Value Date   WBC 5.0 10/28/2021   HGB 14.1 10/28/2021   HCT 42.3 10/28/2021   MCV 96.1 10/28/2021   PLT 188 10/28/2021   NEUTROABS 3.6 07/10/2020    ASSESSMENT & PLAN:  Malignant neoplasm of upper-outer quadrant of right breast in female, estrogen receptor positive ([name]) 10/01/2019:Right lumpectomy ([facility]): IDC, grade 3, 2.8cm, clear margins, 7 right axillary lymph nodes negative for carcinoma.  ER 90%, PR 0%, Ki-67 85%, HER-2 negative T2N0 stage Ib Oncotype DX recurrence score 68, greater than 39% risk of distant recurrence at 9 years   Treatment plan: 1. Adjuvant chemotherapy with CMF (chosen because of PS issues) completed 02/26/20 2. Adjuvant radiation therapy completed 04/28/20 3. Adjuvant antiestrogen therapy Anastrozole 04/27/20   CT of the chest abdomen and pelvis: 09/30/2019: Right breast lesion, internal mammary lymph nodes, upper  abdominal nodal enlargement (probably related to liver cirrhosis) small lung nodules ------------------------------------------------------------------------------------------------------------------------------------------ Current treatment: Anastrozole Anastrozole Toxicities: Tolerating it extremely well without any problems.   Breast Cancer Surveillance: 1. Breast Exam: 02/27/2021: Benign 2. Mammograms: 11/05/2021: Benign Density Cat B   Her sister is also a patient of mine.  RTC in 1 year    No orders of the defined types were placed in this encounter.  The patient has a good understanding of the overall plan. she agrees with it. she will call with any problems that may develop before the next visit here. Total time spent: 30 mins including face to face time and  time spent for planning, charting and co-ordination of care   Harriette Ohara, MD 03/07/22    I Gardiner Coins am acting as a Education administrator for Dr.Chaniyah Jahr  I have reviewed the above documentation for accuracy and completeness, and I agree with the above.

## 2022-03-07 NOTE — Assessment & Plan Note (Addendum)
10/01/2019:Right lumpectomy ([facility]): IDC, grade 3, 2.8cm, clear margins, 7 right axillary lymph nodes negative for carcinoma.  ER 90%, PR 0%, Ki-67 85%, HER-2 negative T2N0 stage Ib Oncotype DX recurrence score 68, greater than 39% risk of distant recurrence at 9 years   Treatment plan: 1. Adjuvant chemotherapy with CMF (chosen because of PS issues) completed 02/26/20 2. Adjuvant radiation therapy completed 04/28/20 3. Adjuvant antiestrogen therapy Anastrozole 04/27/20   CT of the chest abdomen and pelvis: 09/30/2019: Right breast lesion, internal mammary lymph nodes, upper abdominal nodal enlargement (probably related to liver cirrhosis) small lung nodules ------------------------------------------------------------------------------------------------------------------------------------------ Current treatment: Anastrozole Anastrozole Toxicities: Tolerating it extremely well without any problems.   Breast Cancer Surveillance: 1. Breast Exam: 02/27/2021: Benign 2. Mammograms: 11/05/2021: Benign Density Cat B   Her sister is also a patient of mine.  RTC in 1 year

## 2022-03-08 ENCOUNTER — Telehealth: Payer: Self-pay | Admitting: Hematology and Oncology

## 2022-03-08 NOTE — Telephone Encounter (Signed)
Scheduled appointment per 1/22 los. Talked with the patients daughter and she is aware of the made appointment.

## 2022-06-22 ENCOUNTER — Other Ambulatory Visit: Payer: Self-pay | Admitting: Hematology and Oncology

## 2022-10-18 ENCOUNTER — Other Ambulatory Visit: Payer: Self-pay | Admitting: *Deleted

## 2022-10-18 DIAGNOSIS — Z1231 Encounter for screening mammogram for malignant neoplasm of breast: Secondary | ICD-10-CM

## 2022-11-18 ENCOUNTER — Ambulatory Visit
Admission: RE | Admit: 2022-11-18 | Discharge: 2022-11-18 | Disposition: A | Discharge status: Home / Self Care | Payer: Medicare HMO | Source: Ambulatory Visit | Attending: *Deleted | Admitting: *Deleted

## 2022-11-18 DIAGNOSIS — Z1231 Encounter for screening mammogram for malignant neoplasm of breast: Secondary | ICD-10-CM

## 2023-02-22 ENCOUNTER — Other Ambulatory Visit: Payer: Self-pay | Admitting: Gastroenterology

## 2023-02-22 ENCOUNTER — Other Ambulatory Visit: Payer: Self-pay | Admitting: Hematology and Oncology

## 2023-03-08 ENCOUNTER — Inpatient Hospital Stay: Payer: Medicare HMO | Attending: Hematology and Oncology | Admitting: Hematology and Oncology

## 2023-03-24 ENCOUNTER — Encounter (HOSPITAL_COMMUNITY): Payer: Self-pay | Admitting: Gastroenterology

## 2023-03-24 NOTE — Progress Notes (Signed)
 Attempted to obtain medical history for pre op call via telephone, unable to reach at this time. HIPAA compliant voicemail message left requesting return call to pre surgical testing department.

## 2023-03-31 ENCOUNTER — Ambulatory Visit (HOSPITAL_BASED_OUTPATIENT_CLINIC_OR_DEPARTMENT_OTHER): Payer: Medicare HMO

## 2023-03-31 ENCOUNTER — Encounter (HOSPITAL_COMMUNITY): Payer: Self-pay | Admitting: Gastroenterology

## 2023-03-31 ENCOUNTER — Other Ambulatory Visit: Payer: Self-pay

## 2023-03-31 ENCOUNTER — Ambulatory Visit (HOSPITAL_COMMUNITY): Payer: Medicare HMO

## 2023-03-31 ENCOUNTER — Ambulatory Visit (HOSPITAL_COMMUNITY)
Admission: RE | Admit: 2023-03-31 | Discharge: 2023-03-31 | Disposition: A | Discharge status: Home / Self Care | Payer: Medicare HMO | Attending: Gastroenterology | Admitting: Gastroenterology

## 2023-03-31 ENCOUNTER — Encounter (HOSPITAL_COMMUNITY)
Admission: RE | Disposition: A | Discharge status: Home / Self Care | Payer: Self-pay | Source: Home / Self Care | Attending: Gastroenterology

## 2023-03-31 DIAGNOSIS — K920 Hematemesis: Secondary | ICD-10-CM

## 2023-03-31 DIAGNOSIS — K7581 Nonalcoholic steatohepatitis (NASH): Secondary | ICD-10-CM | POA: Diagnosis not present

## 2023-03-31 DIAGNOSIS — F418 Other specified anxiety disorders: Secondary | ICD-10-CM

## 2023-03-31 DIAGNOSIS — K5730 Diverticulosis of large intestine without perforation or abscess without bleeding: Secondary | ICD-10-CM

## 2023-03-31 DIAGNOSIS — K219 Gastro-esophageal reflux disease without esophagitis: Secondary | ICD-10-CM | POA: Diagnosis not present

## 2023-03-31 DIAGNOSIS — Z87891 Personal history of nicotine dependence: Secondary | ICD-10-CM | POA: Diagnosis not present

## 2023-03-31 DIAGNOSIS — I10 Essential (primary) hypertension: Secondary | ICD-10-CM

## 2023-03-31 DIAGNOSIS — Z79899 Other long term (current) drug therapy: Secondary | ICD-10-CM | POA: Insufficient documentation

## 2023-03-31 DIAGNOSIS — K921 Melena: Secondary | ICD-10-CM | POA: Diagnosis present

## 2023-03-31 DIAGNOSIS — Z791 Long term (current) use of non-steroidal anti-inflammatories (NSAID): Secondary | ICD-10-CM | POA: Diagnosis not present

## 2023-03-31 HISTORY — PX: COLONOSCOPY WITH PROPOFOL: SHX5780

## 2023-03-31 SURGERY — COLONOSCOPY WITH PROPOFOL
Anesthesia: Monitor Anesthesia Care

## 2023-03-31 MED ORDER — PROPOFOL 10 MG/ML IV BOLUS
INTRAVENOUS | Status: DC | PRN
  Administered 2023-03-31: 100 mg via INTRAVENOUS

## 2023-03-31 MED ORDER — DEXAMETHASONE SODIUM PHOSPHATE 4 MG/ML IJ SOLN
INTRAMUSCULAR | Status: DC | PRN
  Administered 2023-03-31: 5 mg via INTRAVENOUS

## 2023-03-31 MED ORDER — PROPOFOL 500 MG/50ML IV EMUL
INTRAVENOUS | Status: DC | PRN
  Administered 2023-03-31: 150 ug/kg/min via INTRAVENOUS

## 2023-03-31 MED ORDER — SODIUM CHLORIDE 0.9 % IV SOLN: INTRAVENOUS | Status: DC | PRN

## 2023-03-31 MED ORDER — METOCLOPRAMIDE HCL 5 MG/ML IJ SOLN
INTRAMUSCULAR | Status: DC | PRN
  Administered 2023-03-31: 5 mg via INTRAVENOUS

## 2023-03-31 MED ORDER — EPHEDRINE SULFATE (PRESSORS) 50 MG/ML IJ SOLN
INTRAMUSCULAR | Status: DC | PRN
  Administered 2023-03-31 (×2): 10 mg via INTRAVENOUS

## 2023-03-31 MED ORDER — SUCCINYLCHOLINE CHLORIDE 200 MG/10ML IV SOSY
PREFILLED_SYRINGE | INTRAVENOUS | Status: DC | PRN
  Administered 2023-03-31: 100 mg via INTRAVENOUS

## 2023-03-31 SURGICAL SUPPLY — 20 items
ELECT REM PT RETURN 9FT ADLT (ELECTROSURGICAL)
ELECTRODE REM PT RTRN 9FT ADLT (ELECTROSURGICAL) IMPLANT
FLOOR PAD 36X40 (MISCELLANEOUS) ×1
FORCEPS BIOP RAD 4 LRG CAP 4 (CUTTING FORCEPS) IMPLANT
FORCEPS BIOP RJ4 240 W/NDL (CUTTING FORCEPS)
FORCEPS BXJMBJMB 240X2.8X (CUTTING FORCEPS) IMPLANT
INJECTOR/SNARE I SNARE (MISCELLANEOUS) IMPLANT
LUBRICANT JELLY 4.5OZ STERILE (MISCELLANEOUS) IMPLANT
MANIFOLD NEPTUNE II (INSTRUMENTS) IMPLANT
NDL SCLEROTHERAPY 25GX240 (NEEDLE) IMPLANT
NEEDLE SCLEROTHERAPY 25GX240 (NEEDLE)
PAD FLOOR 36X40 (MISCELLANEOUS) ×2 IMPLANT
PROBE APC STR FIRE (PROBE) IMPLANT
PROBE INJECTION GOLD 7FR (MISCELLANEOUS) IMPLANT
SNARE ROTATE MED OVAL 20MM (MISCELLANEOUS) IMPLANT
SYR 50ML LL SCALE MARK (SYRINGE) IMPLANT
TRAP SPECIMEN MUCOUS 40CC (MISCELLANEOUS) IMPLANT
TUBING ENDO SMARTCAP PENTAX (MISCELLANEOUS) IMPLANT
TUBING IRRIGATION ENDOGATOR (MISCELLANEOUS) ×2 IMPLANT
WATER STERILE IRR 1000ML POUR (IV SOLUTION) IMPLANT

## 2023-03-31 NOTE — Transfer of Care (Signed)
Immediate Anesthesia Transfer of Care Note  Patient: [name]  Procedure(s) Performed: COLONOSCOPY WITH PROPOFOL  Patient Location: PACU and Endoscopy Unit  Anesthesia Type:General  Level of Consciousness: awake and alert   Airway & Oxygen Therapy: Patient Spontanous Breathing and Patient connected to face mask oxygen  Post-op Assessment: Report given to RN and Post -op Vital signs reviewed and stable  Post vital signs: Reviewed and stable  Last Vitals:  Vitals Value Taken Time  BP 128/81 03/31/23 1020  Temp    Pulse 75 03/31/23 1027  Resp 13 03/31/23 1027  SpO2 98 % 03/31/23 1027  Vitals shown include unfiled device data.  Last Pain:  Vitals:   03/31/23 1020  TempSrc:   PainSc: 0-No pain         Complications: No notable events documented.

## 2023-03-31 NOTE — Discharge Instructions (Signed)

## 2023-03-31 NOTE — Anesthesia Procedure Notes (Signed)
Procedure Name: Intubation Date/Time: 03/31/2023 9:51 AM  Performed by: Deri Fuelling, CRNAPre-anesthesia Checklist: Patient identified, Emergency Drugs available, Suction available and Patient being monitored Patient Re-evaluated:Patient Re-evaluated prior to induction Oxygen Delivery Method: Circle system utilized Preoxygenation: Pre-oxygenation with 100% oxygen Induction Type: IV induction Ventilation: Mask ventilation without difficulty Laryngoscope Size: Glidescope and 3 Grade View: Grade I Tube type: Oral Tube size: 7.5 mm Number of attempts: 1 Airway Equipment and Method: Stylet and Oral airway Placement Confirmation: ETT inserted through vocal cords under direct vision, positive ETCO2 and breath sounds checked- equal and bilateral Secured at: 21 cm Tube secured with: Tape Dental Injury: Teeth and Oropharynx as per pre-operative assessment

## 2023-03-31 NOTE — Anesthesia Postprocedure Evaluation (Signed)
Anesthesia Post Note  Patient: [name]  Procedure(s) Performed: COLONOSCOPY WITH PROPOFOL     Patient location during evaluation: PACU Anesthesia Type: MAC Level of consciousness: awake and alert Pain management: pain level controlled Vital Signs Assessment: post-procedure vital signs reviewed and stable Respiratory status: spontaneous breathing, nonlabored ventilation, respiratory function stable and patient connected to nasal cannula oxygen Cardiovascular status: stable and blood pressure returned to baseline Postop Assessment: no apparent nausea or vomiting Anesthetic complications: no   No notable events documented.  Last Vitals:  Vitals:   03/31/23 1010 03/31/23 1020  BP: 118/70 128/81  Pulse: 76 73  Resp: (!) 28 16  Temp:    SpO2: 97% 98%    Last Pain:  Vitals:   03/31/23 1020  TempSrc:   PainSc: 0-No pain                 Marshall Nation

## 2023-03-31 NOTE — Anesthesia Preprocedure Evaluation (Signed)
Anesthesia Evaluation  Patient identified by MRN, date of birth, ID band Patient awake    Reviewed: Allergy & Precautions, NPO status , Patient's Chart, lab work & pertinent test results  Airway Mallampati: II  TM Distance: >3 FB Neck ROM: Full    Dental no notable dental hx.    Pulmonary neg pulmonary ROS, former smoker   Pulmonary exam normal breath sounds clear to auscultation       Cardiovascular hypertension, Normal cardiovascular exam Rhythm:Regular Rate:Normal     Neuro/Psych  PSYCHIATRIC DISORDERS Anxiety Depression    Spinal cord stimulator    GI/Hepatic Neg liver ROS,GERD  ,,rectal bleeding   Endo/Other  negative endocrine ROS    Renal/GU negative Renal ROS  negative genitourinary   Musculoskeletal  (+) Arthritis ,    Abdominal   Peds negative pediatric ROS (+)  Hematology negative hematology ROS (+)   Anesthesia Other Findings   Reproductive/Obstetrics negative OB ROS                              Anesthesia Physical Anesthesia Plan  ASA: 3  Anesthesia Plan: MAC   Post-op Pain Management:    Induction: Intravenous  PONV Risk Score and Plan: Propofol infusion and Treatment may vary due to age or medical condition  Airway Management Planned: Natural Airway  Additional Equipment:   Intra-op Plan:   Post-operative Plan:   Informed Consent: I have reviewed the patients History and Physical, chart, labs and discussed the procedure including the risks, benefits and alternatives for the proposed anesthesia with the patient or authorized representative who has indicated his/her understanding and acceptance.     Dental advisory given  Plan Discussed with: CRNA  Anesthesia Plan Comments:          Anesthesia Quick Evaluation

## 2023-03-31 NOTE — Op Note (Signed)
[facility] Patient Name: [name] Procedure Date: 03/31/2023 MRN: [id number] Attending MD: Jeani Hawking , MD, [id number] Date of Birth: 15-Feb-1950 CSN: [id number] Age: 73 Admit Type: Outpatient Procedure:                Colonoscopy Indications:              Hematochezia Providers:                Jeani Hawking, MD, Fransisca Connors, Rhodia Albright,                            Technician Referring MD:              Medicines:                 Complications:            No immediate complications. Estimated Blood Loss:     Estimated blood loss: none. Procedure:                Pre-Anesthesia Assessment:                           - Prior to the procedure, a History and Physical                            was performed, and patient medications and                            allergies were reviewed. The patient's tolerance of                            previous anesthesia was also reviewed. The risks                            and benefits of the procedure and the sedation                            options and risks were discussed with the patient.                            All questions were answered, and informed consent                            was obtained. Prior Anticoagulants: The patient has                            taken no anticoagulant or antiplatelet agents. ASA                            Grade Assessment: III - A patient with severe                            systemic disease. After reviewing the risks and                            benefits, the patient was deemed in  satisfactory                            condition to undergo the procedure.                           - Sedation was administered by an anesthesia                            professional. Deep sedation was attained.                           After obtaining informed consent, the colonoscope                            was passed under direct vision. Throughout the                             procedure, the patient's blood pressure, pulse, and                            oxygen saturations were monitored continuously. The                            CF-HQ190L ([id number]) Olympus colonoscope was                            introduced through the anus and advanced to the the                            cecum, identified by appendiceal orifice and                            ileocecal valve. The PCF-[id number] ([id number]) Olympus                            slim colonoscope was introduced through the and                            advanced to the. The colonoscopy was performed with                            difficulty. The patient tolerated the procedure                            well. The quality of the bowel preparation was                            evaluated using the BBPS [facility] Bowel Preparation                            Scale) with scores of: Right Colon = 3 (entire  mucosa seen well with no residual staining, small                            fragments of stool or opaque liquid), Transverse                            Colon = 3 (entire mucosa seen well with no residual                            staining, small fragments of stool or opaque                            liquid) and Left Colon = 2 (minor amount of                            residual staining, small fragments of stool and/or                            opaque liquid, but mucosa seen well). The total                            BBPS score equals 8. The ileocecal valve,                            appendiceal orifice, and rectum were photographed. Scope In: 9:19:58 AM Scope Out: 9:56:44 AM Scope Withdrawal Time: 0 hours 14 minutes 7 seconds  Total Procedure Duration: 0 hours 36 minutes 46 seconds  Findings:      Scattered large-mouthed, medium-mouthed and small-mouthed diverticula       were found in the sigmoid colon, descending colon and ascending colon.      The presence of her significant  diverticulosis made passage of the       colonoscope through the sigmoid colon very difficult. This resulted in       the patient vomiting and there was concern for aspiration. Intubation       was performed and the adult colonoscope was changed to the ultraslim       colonoscope. With the equipment change the colonoscope was easily       advanced through the sigmoid colon. Impression:               - Diverticulosis in the sigmoid colon, in the                            descending colon and in the ascending colon.                           - No specimens collected. Moderate Sedation:      Not Applicable - Patient had care per Anesthesia. Recommendation:           - Patient has a contact number available for                            emergencies. The signs and symptoms of potential  delayed complications were discussed with the                            patient. Return to normal activities tomorrow.                            Written discharge instructions were provided to the                            patient.                           - Resume previous diet.                           - Continue present medications.                           - Repeat colonoscopy is not recommended for                            surveillance. Procedure Code(s):        --- Professional ---                           [phone], Colonoscopy, flexible; diagnostic, including                            collection of specimen(s) by brushing or washing,                            when performed (separate procedure) Diagnosis Code(s):        --- Professional ---                           K92.1, Melena (includes Hematochezia)                           K57.30, Diverticulosis of large intestine without                            perforation or abscess without bleeding CPT copyright 2022 American Medical Association. All rights reserved. The codes documented in this report are preliminary and  upon coder review may  be revised to meet current compliance requirements. Jeani Hawking, MD Jeani Hawking, MD 03/31/2023 10:03:53 AM This report has been signed electronically. Number of Addenda: 0

## 2023-03-31 NOTE — H&P (Signed)
[name] HPI: This 73 year old black female presents to the office for further evaluation of rectal bleeding that she has had off-and-on for the last couple of months. She has 3-5 BM's per day with bright red blood in the toilet tissue and in the commode. She been taking large amount of BC's and Meloxicam to help with joint pains. She takes Prilosec for acid reflux. She has a history of NASH. She has good appetite and her weight has been stable. She denies any complaints of abdominal pain, nausea, vomiting,  dysphagia or odynophagia. She denies having a family history of colon cancer, celiac sprue or IBD. Her last colonoscopy was done on 11/04/2015 when a small non-bleeding AVM was noted in the right colon along with sigmoid diverticulosis and a hyperplastic polyp was removed from the rectum and descending colon.  Past Medical History:  Diagnosis Date   AKI (acute kidney injury) (HCC) 2016   all issues resolved yrs ago per pt on 07-22-2021   Anxiety    Arthritis    Cancer (HCC) 09/2019   right breast IDC   Depression    Fatty liver    GERD (gastroesophageal reflux disease)    Hernia    umbilical   Hyperlipidemia    Hypertension    Olecranon bursitis, left elbow    Personal history of radiation therapy     Past Surgical History:  Procedure Laterality Date   ABDOMINAL HYSTERECTOMY     Partial 30-40 yrs ago   BACK SURGERY     4 Bilateral Titanium Screws near L4 and L5   BREAST BIOPSY Right 09/02/2019   times 2   BREAST LUMPECTOMY Right 10/01/2019   BREAST LUMPECTOMY WITH RADIOACTIVE SEED AND SENTINEL LYMPH NODE BIOPSY Right 10/01/2019   Procedure: RIGHT BREAST LUMPECTOMY WITH RADIOACTIVE SEED AND SENTINEL LYMPH NODE BIOPSY;  Surgeon: Harriette Bouillon, MD;  Location: [facility];  Service: General;  Laterality: Right;  PEC BLOCK   CHOLECYSTECTOMY     2001 or 2002   EXCISION / CURETTAGE BONE CYST PHALANGES OF FOOT Right    HEEL SPUR EXCISION Left    keloids removed  from ears     yrs ago   OLECRANON BURSECTOMY Left 07/27/2021   Procedure: left elbow olecranon bursectomy;  Surgeon: Gomez Cleverly, MD;  Location: [facility];  Service: Orthopedics;  Laterality: Left;   OLECRANON BURSECTOMY Left 10/29/2021   Procedure: INCISION AND DRAINAGE LEFT ELBOW;  Surgeon: Gomez Cleverly, MD;  Location: [facility];  Service: Orthopedics;  Laterality: Left;   PORT-A-CATH REMOVAL Right 07/15/2020   Procedure: REMOVAL PORT-A-CATH;  Surgeon: Harriette Bouillon, MD;  Location: [facility];  Service: General;  Laterality: Right;   PORTACATH PLACEMENT Right 11/12/2019   Procedure: INSERTION PORT-A-CATH WITH ULTRASOUND GUIDANCE, ACCESSED;  Surgeon: Harriette Bouillon, MD;  Location:  SURGERY CENTER;  Service: General;  Laterality: Right;   SPINAL CORD STIMULATOR INSERTION  09/26/2012   Dr Shon Baton   SPINAL CORD STIMULATOR INSERTION N/A 09/26/2012   Procedure: SPINAL CORD STIMULATOR PLACEMENT;  Surgeon: Venita Lick, MD;  Location: [facility];  Service: Orthopedics;  Laterality: N/A;   SPINAL CORD STIMULATOR REMOVAL     2013 or 2014, leads left in per patient   TONSILLECTOMY     age 33    Family History  Problem Relation Age of Onset   Diabetes Mother    Cancer Father        prostate   Emphysema  Maternal Grandfather     Social History:  reports that she quit smoking about 11 years ago. Her smoking use included cigarettes and e-cigarettes. She started smoking about 41 years ago. She has a 7.5 pack-year smoking history. She has never used smokeless tobacco. She reports current alcohol use. She reports current drug use. Drug: Marijuana.  Allergies:  Allergies  Allergen Reactions   Atorvastatin     Other reaction(s): dizzy   Ceftin [Cefuroxime] Other (See Comments)    Renal or swelling, patient cannot remember what happens    Other     Blood Products Refusal.   Zofran [Ondansetron] Other (See Comments)    Swelling      Medications: Scheduled: Continuous:  No results found for this or any previous visit (from the past 24 hours).   No results found.  ROS:  As stated above in the HPI otherwise negative.  Blood pressure 130/84, pulse 77, temperature 97.6 F (36.4 C), temperature source Temporal, resp. rate 12, height 5\' 4"  (1.626 m), weight 84.8 kg, SpO2 96%.    PE: Gen: NAD, Alert and Oriented HEENT:  [place]/AT, EOMI Neck: Supple, no LAD Lungs: CTA Bilaterally CV: RRR without M/G/R ABD: Soft, NTND, +BS Ext: No C/C/E  Assessment/Plan: 1) Rectal bleeding - colonoscopy.  Fredonia Casalino D 03/31/2023, 8:25 AM

## 2023-04-03 ENCOUNTER — Encounter (HOSPITAL_COMMUNITY): Payer: Self-pay | Admitting: Gastroenterology

## 2023-04-24 ENCOUNTER — Telehealth: Payer: Self-pay | Admitting: Hematology and Oncology

## 2023-04-24 NOTE — Telephone Encounter (Signed)
 Scheduled appointment per patients daughters request due to patient missing an appointment. Talked with Mrs.Jamie and she is aware of the made appointment for the patient [name].

## 2023-05-11 ENCOUNTER — Inpatient Hospital Stay: Attending: Hematology and Oncology | Admitting: Hematology and Oncology

## 2023-05-11 VITALS — BP 99/66 | HR 72 | Temp 98.4°F | Resp 18 | Ht 64.0 in | Wt 188.0 lb

## 2023-05-11 DIAGNOSIS — G8929 Other chronic pain: Secondary | ICD-10-CM | POA: Insufficient documentation

## 2023-05-11 DIAGNOSIS — Z170 Estrogen receptor positive status [ER+]: Secondary | ICD-10-CM | POA: Insufficient documentation

## 2023-05-11 DIAGNOSIS — Z79811 Long term (current) use of aromatase inhibitors: Secondary | ICD-10-CM | POA: Diagnosis not present

## 2023-05-11 DIAGNOSIS — C50411 Malignant neoplasm of upper-outer quadrant of right female breast: Secondary | ICD-10-CM | POA: Diagnosis not present

## 2023-05-11 DIAGNOSIS — M549 Dorsalgia, unspecified: Secondary | ICD-10-CM | POA: Insufficient documentation

## 2023-05-11 DIAGNOSIS — M1990 Unspecified osteoarthritis, unspecified site: Secondary | ICD-10-CM | POA: Diagnosis not present

## 2023-05-11 DIAGNOSIS — I890 Lymphedema, not elsewhere classified: Secondary | ICD-10-CM | POA: Diagnosis not present

## 2023-05-11 MED ORDER — ANASTROZOLE 1 MG PO TABS: 1.0000 mg | ORAL_TABLET | Freq: Every day | ORAL | 3 refills | Status: DC

## 2023-05-11 NOTE — Assessment & Plan Note (Signed)
 10/01/2019:Right lumpectomy ([facility]): IDC, grade 3, 2.8cm, clear margins, 7 right axillary lymph nodes negative for carcinoma.  ER 90%, PR 0%, Ki-67 85%, HER-2 negative T2N0 stage Ib Oncotype DX recurrence score 68, greater than 39% risk of distant recurrence at 9 years   Treatment plan: 1. Adjuvant chemotherapy with CMF (chosen because of PS issues) completed 02/26/20 2. Adjuvant radiation therapy completed 04/28/20 3. Adjuvant antiestrogen therapy Anastrozole 04/27/20   CT of the chest abdomen and pelvis: 09/30/2019: Right breast lesion, internal mammary lymph nodes, upper abdominal nodal enlargement (probably related to liver cirrhosis) small lung nodules ------------------------------------------------------------------------------------------------------------------------------------------ Current treatment: Anastrozole Anastrozole Toxicities: Tolerating it extremely well without any problems.   Breast Cancer Surveillance: 1. Breast Exam: 05/11/2023: Benign 2. Mammograms: 11/22/2022: Benign Density Cat B   Her sister is also a patient of mine.   RTC in 1 year

## 2023-05-11 NOTE — Progress Notes (Signed)
 Patient Care Team: [facility], [place] as PCP - General Serena Croissant, MD as Consulting Physician (Hematology and Oncology) Dorothy Puffer, MD as Consulting Physician (Radiation Oncology) Harriette Bouillon, MD as Consulting Physician (General Surgery)  DIAGNOSIS:  Encounter Diagnosis  Name Primary?   Malignant neoplasm of upper-outer quadrant of right breast in female, estrogen receptor positive (HCC) Yes    SUMMARY OF ONCOLOGIC HISTORY: Oncology History  Malignant neoplasm of upper-outer quadrant of right breast in female, estrogen receptor positive (HCC)  09/17/2019 Initial Diagnosis   Screening mammogram detected a right breast mass, not palpable on exam. Diagnostic mammogram showed 1.0cm mass at the 12:30 position in the right breast, with a mildly abnormal right axillary lymph node, 4.45mm. Biopsy showed IDC in the breast, grade 3, HER-2 negative (1+), ER+ 70%, PR- 0%, Ki67 85%, and the lymph node negative for carcinoma.   09/24/2019 Cancer Staging   Staging form: Breast, AJCC 8th Edition - Clinical stage from 09/24/2019: Stage IB (cT1b, cN0(f), cM0, G3, ER+, PR-, HER2-)   10/01/2019 Surgery   Right lumpectomy ([facility]): IDC, grade 3, 2.8cm, clear margins, 7 right axillary lymph nodes negative for carcinoma.   10/23/2019 Oncotype testing   Oncotype DX recurrence score 68: Greater than 39% risk of distant recurrence at 9 years   11/13/2019 - 02/26/2020 Chemotherapy   Cytoxan x 6 (11/13/2019 - 02/26/2020) Adrucil x 6 (11/13/2019 - 02/26/2020) Methotrexate x 6 (11/13/2019 - 02/26/2020)   04/01/2020 - 04/28/2020 Radiation Therapy   The patient initially received a dose of 42.56 Gy in 16 fractions to the breast using whole-breast tangent fields. This was delivered using a 3-D conformal technique. The pt received a boost delivering an additional 8 Gy in 4 fractions using a electron boost with electrons. The total dose was 50.56 Gy.   04/2020 - 04/2027 Anti-estrogen oral therapy    Anastrozole     CHIEF COMPLIANT: Profound lymphedema of the right breast  HISTORY OF PRESENT ILLNESS:   History of Present Illness The patient, a 73 year old with a history of breast cancer, presents for a follow-up visit. She reports experiencing "ups and downs" since her diagnosis three years ago. She is currently on anastrozole, which she tolerates well with no reported side effects such as hot flashes or joint pain. However, she has noticed some changes in her body. Her hair has started growing back with a different texture, and she sometimes experiences soreness in her breast, which she manages by massaging it as instructed in therapy. She also reports frequent bowel movements, but an endoscopy performed a month ago did not reveal any abnormalities. The patient has a poor diet, mostly consuming cereal, and has chronic back problems that significantly limit her activities. She has tried various treatments for her back pain, including shots, pills, surgeries, and a neurostimulator, but none have provided lasting relief.     ALLERGIES:  is allergic to atorvastatin, ceftin [cefuroxime], other, and zofran [ondansetron].  MEDICATIONS:  Current Outpatient Medications  Medication Sig Dispense Refill   ALPRAZolam (XANAX) 0.5 MG tablet Take 0.25-0.5 mg by mouth 2 (two) times daily as needed for anxiety.     amLODipine-benazepril (LOTREL) 10-20 MG per capsule Take 1 capsule by mouth in the morning.     anastrozole (ARIMIDEX) 1 MG tablet Take 1 tablet (1 mg total) by mouth daily. 90 tablet 3   Aspirin-Salicylamide-Caffeine (BC HEADACHE POWDER PO) Take 1 packet by mouth daily as needed (pain.).     Cholecalciferol (VITAMIN D3 PO) Take 1  tablet by mouth in the morning.     doxycycline (VIBRAMYCIN) 100 MG capsule Take 1 capsule (100 mg total) by mouth 2 (two) times daily. 28 capsule 0   eszopiclone 3 MG TABS Take 1 tablet (3 mg total) by mouth at bedtime as needed for sleep. Take immediately before  bedtime (Patient taking differently: Take 3 mg by mouth at bedtime. Take immediately before bedtime)     hydrochlorothiazide (HYDRODIURIL) 25 MG tablet Take 25 mg by mouth in the morning.     ibuprofen (ADVIL) 800 MG tablet Take 1 tablet (800 mg total) by mouth every 8 (eight) hours as needed. (Patient not taking: Reported on 10/25/2021) 30 tablet 0   Multiple Vitamins-Minerals (MULTIVITAMIN WITH MINERALS) tablet Take 1 tablet by mouth daily. Centrum Silver     omeprazole (PRILOSEC) 20 MG capsule Take 1 capsule (20 mg total) by mouth daily.     rosuvastatin (CRESTOR) 10 MG tablet      sertraline (ZOLOFT) 100 MG tablet Take 200 mg by mouth in the morning.     No current facility-administered medications for this visit.    PHYSICAL EXAMINATION: ECOG PERFORMANCE STATUS: 1 - Symptomatic but completely ambulatory  Vitals:   05/11/23 1504  BP: 99/66  Pulse: 72  Resp: 18  Temp: 98.4 F (36.9 C)  SpO2: 100%   Filed Weights   05/11/23 1504  Weight: 188 lb (85.3 kg)    Physical Exam BREAST: Profound lymphedema present in the right breast.  (exam performed in the presence of a chaperone)  LABORATORY DATA:  I have reviewed the data as listed    Latest Ref Rng & Units 10/28/2021    9:02 AM 07/27/2021   12:11 PM 07/10/2020    3:57 PM  CMP  Glucose 70 - 99 mg/dL 841  324  401   BUN 8 - 23 mg/dL 26  14  10    Creatinine 0.44 - 1.00 mg/dL 0.27  2.53  6.64   Sodium 135 - 145 mmol/L 140  142  138   Potassium 3.5 - 5.1 mmol/L 3.2  3.1  3.0   Chloride 98 - 111 mmol/L 107  104  104   CO2 22 - 32 mmol/L 23   25   Calcium 8.9 - 10.3 mg/dL 9.6   9.2   Total Protein 6.5 - 8.1 g/dL   7.5   Total Bilirubin 0.3 - 1.2 mg/dL   0.4   Alkaline Phos 38 - 126 U/L   115   AST 15 - 41 U/L   36   ALT 0 - 44 U/L   26     Lab Results  Component Value Date   WBC 5.0 10/28/2021   HGB 14.1 10/28/2021   HCT 42.3 10/28/2021   MCV 96.1 10/28/2021   PLT 188 10/28/2021   NEUTROABS 3.6 07/10/2020     ASSESSMENT & PLAN:  Malignant neoplasm of upper-outer quadrant of right breast in female, estrogen receptor positive (HCC) 10/01/2019:Right lumpectomy ([facility]): IDC, grade 3, 2.8cm, clear margins, 7 right axillary lymph nodes negative for carcinoma.  ER 90%, PR 0%, Ki-67 85%, HER-2 negative T2N0 stage Ib Oncotype DX recurrence score 68, greater than 39% risk of distant recurrence at 9 years   Treatment plan: 1. Adjuvant chemotherapy with CMF (chosen because of PS issues) completed 02/26/20 2. Adjuvant radiation therapy completed 04/28/20 3. Adjuvant antiestrogen therapy Anastrozole 04/27/20   CT of the chest abdomen and pelvis: 09/30/2019: Right breast lesion, internal mammary lymph nodes, upper  abdominal nodal enlargement (probably related to liver cirrhosis) small lung nodules ------------------------------------------------------------------------------------------------------------------------------------------ Current treatment: Anastrozole Anastrozole Toxicities: Tolerating it extremely well without any problems.   Breast Cancer Surveillance: 1. Breast Exam: 05/11/2023: Benign 2. Mammograms: 11/22/2022: Benign Density Cat B   Her sister is also a patient of mine. Back pain issues: Chronic arthritis.  This limits her ability to do any activities. Extremely poor diet: She eats mostly cereal. Profound lymphedema of the right breast: It is not improving with physical therapy or massage therapy.  I recommended that she need a Flexitouch for improvement.  It is causing her significant discomfort.  RTC in 1 year   Assessment & Plan Malignant neoplasm of upper-outer quadrant of right breast, estrogen receptor positive Three years post-diagnosis, on anastrozole with no significant side effects. Mammograms show non-dense breasts, allowing clear imaging. Discussed [facility] for free genetic testing. - Continue anastrozole 1 mg oral daily. - Continue screening mammograms. - Provide  information on [facility] for free genetic testing. - Send a year's worth of medication refills.  Lymphedema Lymphedema with swelling. Previous physical therapy ineffective, insurance denied FlexiTouch machine. Plan to reinitiate process for obtaining the machine. - Refer to physical therapy for FlexiTouch machine evaluation and ordering.  Chronic back pain Chronic back pain with multiple interventions including surgeries, pain neurostimulator, and therapy, none providing lasting relief. No new interventions planned.  Dietary habits Diet primarily consists of cereal, raising concerns about nutritional balance. Discussed importance of a balanced diet. - Encourage a more balanced diet.      Orders Placed This Encounter  Procedures   Ambulatory referral to Physical Therapy    Referral Priority:   Routine    Referral Type:   Physical Medicine    Referral Reason:   Specialty Services Required    Requested Specialty:   Physical Therapy    Number of Visits Requested:   1   The patient has a good understanding of the overall plan. she agrees with it. she will call with any problems that may develop before the next visit here. Total time spent: 30 mins including face to face time and time spent for planning, charting and co-ordination of care   Tamsen Meek, MD 05/11/23

## 2023-05-12 ENCOUNTER — Telehealth: Payer: Self-pay | Admitting: Hematology and Oncology

## 2023-05-12 NOTE — Telephone Encounter (Signed)
 Scheduled appointment per 3/27 los. Talked with the patients daughter and she is aware of the made appointment for the patient.

## 2023-08-16 ENCOUNTER — Other Ambulatory Visit: Payer: Self-pay | Admitting: *Deleted

## 2023-08-16 DIAGNOSIS — Z170 Estrogen receptor positive status [ER+]: Secondary | ICD-10-CM

## 2023-08-16 NOTE — Progress Notes (Signed)
 Received call from pt with complaint of right sided lymphedema.  Per MD pt needing to be seen by PT for further eval and tx.  Orders placed.

## 2023-08-24 ENCOUNTER — Other Ambulatory Visit: Payer: Self-pay

## 2023-08-24 ENCOUNTER — Encounter: Payer: Self-pay | Admitting: Physical Therapy

## 2023-08-24 ENCOUNTER — Ambulatory Visit: Attending: Hematology and Oncology | Admitting: Physical Therapy

## 2023-08-24 DIAGNOSIS — C50411 Malignant neoplasm of upper-outer quadrant of right female breast: Secondary | ICD-10-CM | POA: Diagnosis present

## 2023-08-24 DIAGNOSIS — Z170 Estrogen receptor positive status [ER+]: Secondary | ICD-10-CM | POA: Insufficient documentation

## 2023-08-24 DIAGNOSIS — R293 Abnormal posture: Secondary | ICD-10-CM | POA: Diagnosis present

## 2023-08-24 DIAGNOSIS — I890 Lymphedema, not elsewhere classified: Secondary | ICD-10-CM | POA: Insufficient documentation

## 2023-08-24 NOTE — Therapy (Signed)
 OUTPATIENT PHYSICAL THERAPY ONCOLOGY EVALUATION  Patient Name: [name] MRN: [id number] DOB:07-Feb-1951, 73 y.o., female Today's Date: 08/24/2023   PT End of Session - 08/24/23 1659     Visit Number 1    Number of Visits 9    Date for PT Re-Evaluation 09/21/23    PT Start Time 1603    PT Stop Time 1655    PT Time Calculation (min) 52 min    Activity Tolerance Patient tolerated treatment well    Behavior During Therapy [facility] for tasks assessed/performed           Past Medical History:  Diagnosis Date   AKI (acute kidney injury) (HCC) 2016   all issues resolved yrs ago per pt on 07-22-2021   Anxiety    Arthritis    Cancer (HCC) 09/2019   right breast IDC   Depression    Fatty liver    GERD (gastroesophageal reflux disease)    Hernia    umbilical   Hyperlipidemia    Hypertension    Olecranon bursitis, left elbow    Personal history of radiation therapy    Past Surgical History:  Procedure Laterality Date   ABDOMINAL HYSTERECTOMY     Partial 30-40 yrs ago   BACK SURGERY     4 Bilateral Titanium Screws near L4 and L5   BREAST BIOPSY Right 09/02/2019   times 2   BREAST LUMPECTOMY Right 10/01/2019   BREAST LUMPECTOMY WITH RADIOACTIVE SEED AND SENTINEL LYMPH NODE BIOPSY Right 10/01/2019   Procedure: RIGHT BREAST LUMPECTOMY WITH RADIOACTIVE SEED AND SENTINEL LYMPH NODE BIOPSY;  Surgeon: Vanderbilt Ned, MD;  Location: [facility];  Service: General;  Laterality: Right;  PEC BLOCK   CHOLECYSTECTOMY     2001 or 2002   COLONOSCOPY WITH PROPOFOL  N/A 03/31/2023   Procedure: COLONOSCOPY WITH PROPOFOL ;  Surgeon: Rollin Dover, MD;  Location: [facility];  Service: Gastroenterology;  Laterality: N/A;   EXCISION / CURETTAGE BONE CYST PHALANGES OF FOOT Right    HEEL SPUR EXCISION Left    keloids removed from ears     yrs ago   OLECRANON BURSECTOMY Left 07/27/2021   Procedure: left elbow olecranon bursectomy;  Surgeon: Alyse Agent, MD;  Location: [facility];  Service: Orthopedics;  Laterality: Left;   OLECRANON BURSECTOMY Left 10/29/2021   Procedure: INCISION AND DRAINAGE LEFT ELBOW;  Surgeon: Alyse Agent, MD;  Location: [facility];  Service: Orthopedics;  Laterality: Left;   PORT-A-CATH REMOVAL Right 07/15/2020   Procedure: REMOVAL PORT-A-CATH;  Surgeon: Vanderbilt Ned, MD;  Location: [facility];  Service: General;  Laterality: Right;   PORTACATH PLACEMENT Right 11/12/2019   Procedure: INSERTION PORT-A-CATH WITH ULTRASOUND GUIDANCE, ACCESSED;  Surgeon: Vanderbilt Ned, MD;  Location: [facility];  Service: General;  Laterality: Right;   SPINAL CORD STIMULATOR INSERTION  09/26/2012   Dr BURNETTA   SPINAL CORD STIMULATOR INSERTION N/A 09/26/2012   Procedure: SPINAL CORD STIMULATOR PLACEMENT;  Surgeon: Donaciano BURNETTA, MD;  Location: [facility];  Service: Orthopedics;  Laterality: N/A;   SPINAL CORD STIMULATOR REMOVAL     2013 or 2014, leads left in per patient   TONSILLECTOMY     age 67   Patient Active Problem List   Diagnosis Date Noted   Port-A-Cath in place 11/21/2019   Malignant neoplasm of upper-outer quadrant of right breast in female, estrogen receptor positive (HCC) 09/17/2019   AKI (acute kidney injury) (HCC) 08/03/2014   Nausea with vomiting  08/03/2014   Dehydration 08/03/2014   Bilateral flank pain 05/09/2012   HTN (hypertension) 05/09/2012   GERD (gastroesophageal reflux disease) 05/09/2012   Adjustment disorder with mixed anxiety and depressed mood 05/09/2012    PCP: Suzen Downy NP  REFERRING PROVIDER: Odean Potts MD  REFERRING DIAG: C50.411,Z17.0 (ICD-10-CM) - Malignant neoplasm of upper-outer quadrant of right breast in female, estrogen receptor positive (HCC)  THERAPY DIAG:  Lymphedema, not elsewhere classified  Abnormal posture  Malignant neoplasm of upper-outer quadrant of right breast in female, estrogen receptor positive (HCC)  ONSET DATE: 08/2020  with exacerbation  Rationale for Evaluation and Treatment Rehabilitation  SUBJECTIVE                                                                                                                                                                                           SUBJECTIVE STATEMENT: I am here because my daughter wants me to be here. I am in so much pain in my back. I can not live like this. I have been to so many doctors and no one can help me.   PERTINENT HISTORY:  diagnosed with Rt ER positive, PR/HER2 negative IDC with lumpectomy and SLNB on 10/01/19 with 0/7 lymph nodes removed.  other health history of HTN, anxiety, hysterectomy, 6-8 lumbar surgeries over the years including,spinal stimulator that was removed and pt reports only the leads are in place now. Has OA all over. Can't hold arms up long  PAIN:  Are you having pain? Yes NPRS scale: 10/10 Pain location:back Pain orientation: bilateral PAIN TYPE: constant Pain description: constant and stabbing  Aggravating factors:  moving around Relieving factors: sleeping  PRECAUTIONS: HTN, anxiety, hysterectomy, 6-8 lumbar surgeries over the years including,spinal stimulator that was removed and pt reports only the leads are in place now. Has OA all over, righ UE lymphedema risk   WEIGHT BEARING RESTRICTIONS No but has difficulty walking and uses rollator  FALLS:  Has patient fallen in last 6 months? No  LIVING ENVIRONMENT: Lives with: lives with their family Lives in: House/apartment Stairs: Yes; Internal: 14 steps; on left going up Has following equipment at home: Walker - 2 wheeled and bed side commode  OCCUPATION: Retired, on disability  LEISURE: does seated exercises  HAND DOMINANCE : right   PRIOR LEVEL OF FUNCTION: Independent with household mobility with device  PATIENT GOALS Decrease Right breast swelling   OBJECTIVE  COGNITION:  Overall cognitive status:   Within functional limits for tasks  assessed  PALPATION: L R breast very edematous with fibrosis in inferior breast   OBSERVATIONS / OTHER ASSESSMENTS:  Visibly increased swelling in right breast  with hyperpigmentation noted from prior radiation.  Pt with enlarged pores, and mild pitting at inferior breast       POSTURE: forward head, rounded shoulders increased lumbar lordosis, increased thoracic kyphosis  UPPER EXTREMITY AROM/PROM:  A/PROM RIGHT   10/21/21  RIGHT EVAL 08/24/23 in sitting  Shoulder extension    Shoulder flexion 140 159  Shoulder abduction 145 133  Shoulder internal rotation    Shoulder external rotation      (Blank rows = not tested)  A/PROM LEFT   10/21/21 LEFT EVAL 08/24/23 in sitting  Shoulder extension    Shoulder flexion 142 sitting 159  Shoulder abduction 140 sitting 166   Shoulder internal rotation    Shoulder external rotation      (Blank rows = not tested)   UPPER EXTREMITY STRENGTH: NT due to OA  LYMPHEDEMA ASSESSMENTS:   SURGERY TYPE/DATE: Right lumpectomy 10/01/2019  NUMBER OF LYMPH NODES REMOVED: 7  CHEMOTHERAPY: yes  RADIATION:yes, ended 04/28/2020  HORMONE TREATMENT: yes  INFECTIONS: yes , 09/2020  LYMPHEDEMA ASSESSMENTS:    Chest measurement under arms 97.1 cm Widest part of chest above right nipple 116.5  LANDMARK RIGHT  10/21/21 LEFT 08/24/23 EVAL  10 cm proximal to olecranon process 30.7 29  Olecranon process 25.6 25.5  10 cm proximal to ulnar styloid process 22.3 20  Just proximal to ulnar styloid process 15.6 15.7  Across hand at thumb web space 19.3 18.7  At base of 2nd digit 6.3 6.3  (Blank rows = not tested)  LANDMARK LEFT  10/21/21 LEFT  08/24/23  EVAL  10 cm proximal to olecranon process 31.4 29.4  Olecranon process 27 over bandage from prior sx 23.8  10 cm proximal to ulnar styloid process 22.9 20.1  Just proximal to ulnar styloid process 16.0 15.5  Across hand at thumb web space 19.0 18.3  At base of 2nd digit 6.4 6  (Blank rows = not  tested)  Assistive device utilized: Environmental consultant - 2 wheeled Level of assistance: Modified independence Comments:   Breast complaints survey 33   TODAY'S TREATMENT  08/24/23: In supine: Short neck, no deep breathing due to back pain, L axillary nodes and establishment of interaxillary pathway, R inguinal nodes and establishment of axilloinguinal pathway, then R breast moving fluid towards pathways spending extra time in any areas of fibrosis then retracing all steps. Advised pt to begin wearing her compression bra daily  PATIENT EDUCATION:  Education details: wear compression bra, Person educated: Patient Education method: Explanation Education comprehension: verbalized understanding   HOME EXERCISE PROGRAM: None given  ASSESSMENT:  CLINICAL IMPRESSION: Patient is a 73 y.o. female who was seen today for physical therapy evaluation and treatment for right breast lymphedema. Pt is with  significant right breast swelling/heaviness and right breast with mild redness remaining since radiation with enlarged pores and mild pitting edema at inferior breast . She will benefit from skilled therapy to decrease swelling and heaviness and to relieve discomfort and decrease risk of infection..   OBJECTIVE IMPAIRMENTS decreased knowledge of condition, increased edema, and pain.   ACTIVITY LIMITATIONS carrying, sitting, standing, and bed mobility  PARTICIPATION LIMITATIONS: meal prep, laundry, and driving  PERSONAL FACTORS 3+ comorbidities: Right breast Cancer s/p surgery and radiation, severe LBP are also affecting patient's functional outcome.   REHAB POTENTIAL: Good  CLINICAL DECISION MAKING: Stable/uncomplicated  EVALUATION COMPLEXITY: Low  GOALS: Goals reviewed with patient? Yes 09/14/2023 SHORT TERM GOALS: Target date: 09/07/23   Pt will report decreased right breast discomfort and  heaviness by 25% Baseline: Goal status: INITIAL    LONG TERM GOALS: Target date: 09/21/23  Pt will be  compliant with use of compression bra and foam as needed to decrease swelling Baseline:  Goal status: INITIAL  2.  Pt will report decreased right breast heaviness /discomfort by 50% or greater Baseline:  Goal status: INITIAL  3.  Pts breast complaints survery will decrease to 16 or less to demonstrate improvements Baseline:  Goal status: INITIAL    PLAN: PT FREQUENCY: 2x/week  PT DURATION: 4 weeks  PLANNED INTERVENTIONS: [id number]- PT Re-evaluation, 97110-Therapeutic exercises, 97530- Therapeutic activity, 97535- Self Care, [id number]- Manual therapy, [id number]- Orthotic Initial, [id number]- Orthotic/Prosthetic subsequent, 97016- Vasopneumatic device, Patient/Family education, Manual lymph drainage, and Scar mobilization  PLAN FOR NEXT SESSION: initiate MLD and review with pt. Consider foam in bra, did she find compression bra?   [facility], PT 08/24/2023, 5:05 PM

## 2023-09-13 ENCOUNTER — Other Ambulatory Visit: Payer: Self-pay | Admitting: *Deleted

## 2023-09-13 MED ORDER — ANASTROZOLE 1 MG PO TABS: 1.0000 mg | ORAL_TABLET | Freq: Every day | ORAL | 3 refills | Status: AC

## 2023-11-01 ENCOUNTER — Other Ambulatory Visit: Payer: Self-pay | Admitting: Family Medicine

## 2023-11-01 DIAGNOSIS — Z0000 Encounter for general adult medical examination without abnormal findings: Secondary | ICD-10-CM

## 2023-11-27 ENCOUNTER — Ambulatory Visit
Admission: RE | Admit: 2023-11-27 | Discharge: 2023-11-27 | Disposition: A | Discharge status: Home / Self Care | Source: Ambulatory Visit | Attending: Family Medicine | Admitting: Family Medicine

## 2023-11-27 DIAGNOSIS — Z0000 Encounter for general adult medical examination without abnormal findings: Secondary | ICD-10-CM

## 2024-05-13 ENCOUNTER — Ambulatory Visit: Admitting: Hematology and Oncology
